# Patient Record
Sex: Male | Born: 1970 | Race: Black or African American | Hispanic: No | Smoking: Current every day smoker
Health system: Southern US, Community
[De-identification: ages and names within clinical notes are randomized; demographics above are authoritative.]

## PROBLEM LIST (undated history)

## (undated) DIAGNOSIS — J189 Pneumonia, unspecified organism: Secondary | ICD-10-CM

## (undated) DIAGNOSIS — F419 Anxiety disorder, unspecified: Secondary | ICD-10-CM

## (undated) DIAGNOSIS — R51 Headache: Secondary | ICD-10-CM

## (undated) DIAGNOSIS — Z9861 Coronary angioplasty status: Principal | ICD-10-CM

## (undated) DIAGNOSIS — N200 Calculus of kidney: Secondary | ICD-10-CM

## (undated) DIAGNOSIS — I10 Essential (primary) hypertension: Secondary | ICD-10-CM

## (undated) DIAGNOSIS — G4733 Obstructive sleep apnea (adult) (pediatric): Secondary | ICD-10-CM

## (undated) DIAGNOSIS — K219 Gastro-esophageal reflux disease without esophagitis: Secondary | ICD-10-CM

## (undated) DIAGNOSIS — I2119 ST elevation (STEMI) myocardial infarction involving other coronary artery of inferior wall: Secondary | ICD-10-CM

## (undated) DIAGNOSIS — I2510 Atherosclerotic heart disease of native coronary artery without angina pectoris: Principal | ICD-10-CM

## (undated) DIAGNOSIS — E785 Hyperlipidemia, unspecified: Secondary | ICD-10-CM

## (undated) DIAGNOSIS — E119 Type 2 diabetes mellitus without complications: Secondary | ICD-10-CM

## (undated) HISTORY — DX: Atherosclerotic heart disease of native coronary artery without angina pectoris: I25.10

## (undated) HISTORY — DX: Coronary angioplasty status: Z98.61

## (undated) HISTORY — DX: ST elevation (STEMI) myocardial infarction involving other coronary artery of inferior wall: I21.19

## (undated) HISTORY — DX: Obstructive sleep apnea (adult) (pediatric): G47.33

## (undated) HISTORY — DX: Hyperlipidemia, unspecified: E78.5

---

## 1990-05-24 HISTORY — PX: SHOULDER ARTHROSCOPY: SHX128

## 2001-04-19 ENCOUNTER — Emergency Department (HOSPITAL_COMMUNITY): Admission: EM | Admit: 2001-04-19 | Discharge: 2001-04-19 | Payer: Self-pay | Admitting: Emergency Medicine

## 2008-07-18 ENCOUNTER — Emergency Department (HOSPITAL_COMMUNITY): Admission: EM | Admit: 2008-07-18 | Discharge: 2008-07-18 | Payer: Self-pay | Admitting: Emergency Medicine

## 2010-01-29 ENCOUNTER — Emergency Department (HOSPITAL_COMMUNITY): Admission: EM | Admit: 2010-01-29 | Discharge: 2010-01-29 | Payer: Self-pay | Admitting: Emergency Medicine

## 2010-08-06 LAB — POCT I-STAT, CHEM 8
Calcium, Ion: 1.15 mmol/L (ref 1.12–1.32)
Chloride: 104 mEq/L (ref 96–112)
Glucose, Bld: 81 mg/dL (ref 70–99)
HCT: 63 % — ABNORMAL HIGH (ref 39.0–52.0)
TCO2: 29 mmol/L (ref 0–100)

## 2010-08-06 LAB — HEMOCCULT GUIAC POC 1CARD (OFFICE): Fecal Occult Bld: POSITIVE

## 2011-05-21 ENCOUNTER — Ambulatory Visit (INDEPENDENT_AMBULATORY_CARE_PROVIDER_SITE_OTHER): Payer: 59

## 2011-05-21 DIAGNOSIS — J45909 Unspecified asthma, uncomplicated: Secondary | ICD-10-CM

## 2011-10-23 ENCOUNTER — Inpatient Hospital Stay (HOSPITAL_COMMUNITY)
Admission: EM | Admit: 2011-10-23 | Discharge: 2011-10-26 | DRG: 247 | Disposition: A | Discharge status: Home / Self Care | Payer: 59 | Attending: Cardiology | Admitting: Cardiology

## 2011-10-23 ENCOUNTER — Encounter (HOSPITAL_COMMUNITY): Payer: Self-pay

## 2011-10-23 ENCOUNTER — Encounter (HOSPITAL_COMMUNITY)
Admission: EM | Disposition: A | Discharge status: Home / Self Care | Payer: Self-pay | Source: Home / Self Care | Attending: Cardiology

## 2011-10-23 DIAGNOSIS — F17200 Nicotine dependence, unspecified, uncomplicated: Secondary | ICD-10-CM | POA: Diagnosis present

## 2011-10-23 DIAGNOSIS — I2111 ST elevation (STEMI) myocardial infarction involving right coronary artery: Secondary | ICD-10-CM | POA: Diagnosis present

## 2011-10-23 DIAGNOSIS — E669 Obesity, unspecified: Secondary | ICD-10-CM | POA: Diagnosis present

## 2011-10-23 DIAGNOSIS — Z955 Presence of coronary angioplasty implant and graft: Secondary | ICD-10-CM

## 2011-10-23 DIAGNOSIS — I2510 Atherosclerotic heart disease of native coronary artery without angina pectoris: Secondary | ICD-10-CM | POA: Diagnosis present

## 2011-10-23 DIAGNOSIS — I2119 ST elevation (STEMI) myocardial infarction involving other coronary artery of inferior wall: Secondary | ICD-10-CM

## 2011-10-23 DIAGNOSIS — Z6835 Body mass index (BMI) 35.0-35.9, adult: Secondary | ICD-10-CM

## 2011-10-23 DIAGNOSIS — I213 ST elevation (STEMI) myocardial infarction of unspecified site: Secondary | ICD-10-CM

## 2011-10-23 DIAGNOSIS — E66812 Obesity, class 2: Secondary | ICD-10-CM

## 2011-10-23 DIAGNOSIS — Z8249 Family history of ischemic heart disease and other diseases of the circulatory system: Secondary | ICD-10-CM

## 2011-10-23 DIAGNOSIS — Z720 Tobacco use: Secondary | ICD-10-CM | POA: Diagnosis present

## 2011-10-23 DIAGNOSIS — I25119 Atherosclerotic heart disease of native coronary artery with unspecified angina pectoris: Secondary | ICD-10-CM | POA: Diagnosis present

## 2011-10-23 HISTORY — PX: LEFT HEART CATHETERIZATION WITH CORONARY ANGIOGRAM: SHX5451

## 2011-10-23 HISTORY — PX: PERCUTANEOUS CORONARY STENT INTERVENTION (PCI-S): SHX5485

## 2011-10-23 HISTORY — DX: ST elevation (STEMI) myocardial infarction involving other coronary artery of inferior wall: I21.19

## 2011-10-23 HISTORY — DX: Atherosclerotic heart disease of native coronary artery without angina pectoris: I25.10

## 2011-10-23 LAB — CBC
Hemoglobin: 16.8 g/dL (ref 13.0–17.0)
MCH: 28.7 pg (ref 26.0–34.0)
MCHC: 33.5 g/dL (ref 30.0–36.0)
RDW: 14.3 % (ref 11.5–15.5)

## 2011-10-23 LAB — BASIC METABOLIC PANEL
BUN: 11 mg/dL (ref 6–23)
Creatinine, Ser: 0.98 mg/dL (ref 0.50–1.35)
GFR calc Af Amer: >90 mL/min (ref >90)
GFR calc non Af Amer: >90 mL/min (ref >90)

## 2011-10-23 LAB — CARDIAC PANEL(CRET KIN+CKTOT+MB+TROPI)
CK, MB: 8.4 ng/mL (ref 0.3–4.0)
Relative Index: 3.5 — ABNORMAL HIGH (ref 0.0–2.5)
Relative Index: 5.1 — ABNORMAL HIGH (ref 0.0–2.5)
Total CK: 242 U/L — ABNORMAL HIGH (ref 7–232)
Total CK: 350 U/L — ABNORMAL HIGH (ref 7–232)

## 2011-10-23 LAB — DIFFERENTIAL
Basophils Absolute: 0 10*3/uL (ref 0.0–0.1)
Basophils Relative: 0 % (ref 0–1)
Eosinophils Absolute: 0.4 10*3/uL (ref 0.0–0.7)
Monocytes Absolute: 0.7 10*3/uL (ref 0.1–1.0)
Monocytes Relative: 7 % (ref 3–12)
Neutro Abs: 6.4 10*3/uL (ref 1.7–7.7)
Neutrophils Relative %: 59 % (ref 43–77)

## 2011-10-23 SURGERY — LEFT HEART CATHETERIZATION WITH CORONARY ANGIOGRAM
Anesthesia: LOCAL

## 2011-10-23 MED ORDER — SODIUM CHLORIDE 0.9 % IJ SOLN
3.0000 mL | Freq: Two times a day (BID) | INTRAMUSCULAR | Status: DC
  Administered 2011-10-24: 21:00:00 via INTRAVENOUS
  Administered 2011-10-25: 3 mL via INTRAVENOUS

## 2011-10-23 MED ORDER — SODIUM CHLORIDE 0.9 % IV SOLN: 0.2500 mg/kg/h | INTRAVENOUS | Status: AC

## 2011-10-23 MED ORDER — MIDAZOLAM HCL 2 MG/2ML IJ SOLN
INTRAMUSCULAR | Status: AC
  Filled 2011-10-23: qty 2

## 2011-10-23 MED ORDER — PRASUGREL HCL 10 MG PO TABS
ORAL_TABLET | ORAL | Status: AC
  Administered 2011-10-24: 10 mg via ORAL
  Filled 2011-10-23: qty 6

## 2011-10-23 MED ORDER — NICOTINE 14 MG/24HR TD PT24
14.0000 mg | MEDICATED_PATCH | Freq: Every day | TRANSDERMAL | Status: DC
  Administered 2011-10-26: 14 mg via TRANSDERMAL
  Filled 2011-10-23 (×4): qty 1

## 2011-10-23 MED ORDER — FENTANYL CITRATE 0.05 MG/ML IJ SOLN
INTRAMUSCULAR | Status: AC
  Filled 2011-10-23: qty 2

## 2011-10-23 MED ORDER — ONDANSETRON HCL 4 MG/2ML IJ SOLN: 4.0000 mg | Freq: Four times a day (QID) | INTRAMUSCULAR | Status: DC | PRN

## 2011-10-23 MED ORDER — DIAZEPAM 5 MG PO TABS: 5.0000 mg | ORAL_TABLET | ORAL | Status: DC | PRN

## 2011-10-23 MED ORDER — SODIUM CHLORIDE 0.9 % IV SOLN: 250.0000 mL | INTRAVENOUS | Status: DC

## 2011-10-23 MED ORDER — HEPARIN BOLUS VIA INFUSION
4000.0000 [IU] | Freq: Once | INTRAVENOUS | Status: AC
  Administered 2011-10-23: 4000 [IU] via INTRAVENOUS

## 2011-10-23 MED ORDER — NITROGLYCERIN IN D5W 200-5 MCG/ML-% IV SOLN
2.0000 ug/min | INTRAVENOUS | Status: DC
  Administered 2011-10-23: 5 ug/min via INTRAVENOUS
  Administered 2011-10-23: 10 ug/min via INTRAVENOUS

## 2011-10-23 MED ORDER — MORPHINE SULFATE 4 MG/ML IJ SOLN
INTRAMUSCULAR | Status: AC
  Administered 2011-10-23: 10:00:00
  Filled 2011-10-23: qty 1

## 2011-10-23 MED ORDER — NITROGLYCERIN 0.2 MG/ML ON CALL CATH LAB
INTRAVENOUS | Status: AC
  Filled 2011-10-23: qty 1

## 2011-10-23 MED ORDER — ACETAMINOPHEN 325 MG PO TABS
650.0000 mg | ORAL_TABLET | ORAL | Status: DC | PRN
  Administered 2011-10-23: 650 mg via ORAL
  Filled 2011-10-23: qty 2

## 2011-10-23 MED ORDER — SODIUM CHLORIDE 0.9 % IJ SOLN: 3.0000 mL | INTRAMUSCULAR | Status: DC | PRN

## 2011-10-23 MED ORDER — LIDOCAINE HCL (PF) 1 % IJ SOLN
INTRAMUSCULAR | Status: AC
  Filled 2011-10-23: qty 30

## 2011-10-23 MED ORDER — NITROGLYCERIN IN D5W 200-5 MCG/ML-% IV SOLN
5.0000 ug/min | INTRAVENOUS | Status: DC
  Administered 2011-10-23: 5 ug/min via INTRAVENOUS

## 2011-10-23 MED ORDER — SODIUM CHLORIDE 0.9 % IV SOLN
INTRAVENOUS | Status: DC
  Administered 2011-10-23: 10:00:00 via INTRAVENOUS

## 2011-10-23 MED ORDER — BIVALIRUDIN 250 MG IV SOLR
INTRAVENOUS | Status: AC
  Filled 2011-10-23: qty 250

## 2011-10-23 MED ORDER — PRASUGREL HCL 10 MG PO TABS
10.0000 mg | ORAL_TABLET | Freq: Every day | ORAL | Status: DC
  Administered 2011-10-24 – 2011-10-26 (×3): 10 mg via ORAL
  Filled 2011-10-23 (×3): qty 1

## 2011-10-23 MED ORDER — ASPIRIN 81 MG PO CHEW
81.0000 mg | CHEWABLE_TABLET | Freq: Every day | ORAL | Status: DC
  Administered 2011-10-24 – 2011-10-26 (×3): 81 mg via ORAL
  Filled 2011-10-23 (×3): qty 1

## 2011-10-23 MED ORDER — HEPARIN (PORCINE) IN NACL 2-0.9 UNIT/ML-% IJ SOLN
INTRAMUSCULAR | Status: AC
  Filled 2011-10-23: qty 2000

## 2011-10-23 MED ORDER — MORPHINE SULFATE 2 MG/ML IJ SOLN
2.0000 mg | INTRAMUSCULAR | Status: DC | PRN
  Filled 2011-10-23: qty 1

## 2011-10-23 MED ORDER — SODIUM CHLORIDE 0.9 % IV SOLN: 1.0000 mL/kg/h | INTRAVENOUS | Status: AC

## 2011-10-23 MED ORDER — ONDANSETRON HCL 4 MG/2ML IJ SOLN
INTRAMUSCULAR | Status: AC
  Filled 2011-10-23: qty 2

## 2011-10-23 MED ORDER — ATORVASTATIN CALCIUM 40 MG PO TABS
40.0000 mg | ORAL_TABLET | Freq: Every day | ORAL | Status: DC
  Administered 2011-10-23 – 2011-10-25 (×3): 40 mg via ORAL
  Filled 2011-10-23 (×4): qty 1

## 2011-10-23 MED ORDER — METOPROLOL SUCCINATE ER 25 MG PO TB24
25.0000 mg | ORAL_TABLET | Freq: Every day | ORAL | Status: DC
  Administered 2011-10-23 – 2011-10-26 (×4): 25 mg via ORAL
  Filled 2011-10-23 (×4): qty 1

## 2011-10-23 NOTE — Progress Notes (Signed)
ANTICOAGULATION CONSULT NOTE - Initial Consult  Pharmacy Consult for bivalirudin (Angiomax)  Indication: chest pain/ACS  No Known Allergies  Patient Measurements: Weight: 299 lb 13.2 oz (136 kg)  Vital Signs: Pulse Rate: 75  (06/01 1130)  Labs:  [facility] 10/23/11 0931  HGB 16.8  HCT 50.1  PLT 188  APTT --  LABPROT --  INR --  HEPARINUNFRC --  CREATININE 0.98  CKTOTAL --  CKMB --  TROPONINI --    CrCl is unknown because there is no height on file for the current visit.   Medical History: Past Medical History  Diagnosis Date  . No pertinent past medical history     Medications:  Scheduled:    . aspirin  81 mg Oral Daily  . atorvastatin  40 mg Oral q1800  . bivalirudin      . fentaNYL      . heparin      . heparin  4,000 Units Intravenous Once  . lidocaine      . metoprolol succinate  25 mg Oral Daily  . midazolam      . midazolam      . morphine      . nicotine  14 mg Transdermal Daily  . nitroGLYCERIN      . ondansetron      . prasugrel      . prasugrel  10 mg Oral Daily  . sodium chloride  3 mL Intravenous Q12H    Assessment: 41 yo M who presented with 10/10 chest pain with STEMI s/p PCI to continue on bivalirudin for 2 hours post-cath. Cath ended ~1100  Goal of Therapy:  Monitor platelets by anticoagulation protocol: Yes   Plan:  1. Bivalirudin 0.25 mg/kg/h x2 hours to end at 1300 2. Monitor for s/sx bleeding    Concha Norway 10/23/2011,12:01 PM

## 2011-10-23 NOTE — Progress Notes (Signed)
This visit was in response to a Code Stemi.  Pt was very tearful and afraid.  Pt asked me to call his mother, George Dickson, and let her know what is going on.  The phone number he gave me was [phone].  I attempted to call it three different times.  Finally I left a message requesting that the pt's mother call [facility] and left the ED phone number.  I alerted ED staff that she would be calling.  Please page me if family or pt needs further assistance.  I will continue to follow today and will refer to unit chaplain for follow-up.   Darrell Hauk  [phone]  oncall pager

## 2011-10-23 NOTE — Progress Notes (Signed)
CRITICAL VALUE ALERT  Critical value received:  ckmb 8.4 trop i 0.84   Date of notification: 10/23/2011   Time of notification:  1:32 PM    Critical value read back:yes  Nurse who received alert:  Desiree Lucy   MD notified (1st page):  hagan b pa  Time of first page:  1:33 PM   MD notified (2nd page):  Time of second page:  Responding MD:  Sharene Skeans, b pa  Time MD responded:  1:33 PM

## 2011-10-23 NOTE — ED Provider Notes (Signed)
History     CSN: [id number]  Arrival date & time 10/23/11  4098   First MD Initiated Contact with Patient 10/23/11 [phone]      Chief Complaint  Patient presents with  . Chest Pain    (Consider location/radiation/quality/duration/timing/severity/associated sxs/prior treatment) Patient is a 41 y.o. male presenting with chest pain. The history is provided by the patient and the EMS personnel. The history is limited by the condition of the patient.  Chest Pain Primary symptoms include nausea. Pertinent negatives for primary symptoms include no shortness of breath and no vomiting.  Pertinent negatives for associated symptoms include no diaphoresis.    the patient is a 41 year old, male, smoker.  He was going to the bathroom and developed left-sided chest pain, which radiated into his left arm.  It was associated with nausea and shortness of breath.  He denied diaphoresis.  Pain is a heaviness.  It is 8/10 in intensity.  He denies a history of hypertension, diabetes, prior coronary artery disease.  He, says that his mother had a heart attack, as well.  He takes no medications and denies allergies to medications.  He called EMS.  They picked him up and gave him nitroglycerin morphine and aspirin.  Level V caveat applies for code STEMI  History reviewed. No pertinent past medical history.  History reviewed. No pertinent past surgical history.  History reviewed. No pertinent family history.  History  Substance Use Topics  . Smoking status: Current Everyday Smoker  . Smokeless tobacco: Not on file  . Alcohol Use: No      Review of Systems  Constitutional: Negative for diaphoresis.  Respiratory: Negative for shortness of breath.   Cardiovascular: Positive for chest pain.  Gastrointestinal: Positive for nausea. Negative for vomiting.    Allergies  Review of patient's allergies indicates no known allergies.  Home Medications  No current outpatient prescriptions on file.  There were  no vitals taken for this visit.  Physical Exam  Nursing note and vitals reviewed. Constitutional: He is oriented to person, place, and time. He appears well-developed and well-nourished. No distress.       Anxious  HENT:  Head: Normocephalic and atraumatic.  Eyes: Conjunctivae are normal.  Neck: Normal range of motion. Neck supple.  Cardiovascular: Normal rate.   No murmur heard.      2+ radial, and dorsalis pedis pulses bilaterally  Pulmonary/Chest: Effort normal. He has no rales.  Abdominal: Soft. He exhibits no distension. There is no tenderness.  Musculoskeletal: Normal range of motion. He exhibits no edema.  Neurological: He is alert and oriented to person, place, and time.  Skin: Skin is warm and dry.  Psychiatric: He has a normal mood and affect. Thought content normal.    ED Course  Procedures (including critical care time) Inferior wall MI Heparin started in the emergency department at STEMI called the cardiologist arrived.  A few minutes after I entered the patient to the Cath Lab.  Labs Reviewed  CBC  DIFFERENTIAL  BASIC METABOLIC PANEL   No results found.   No diagnosis found.  ecg Normal sinus rhythm with heart rate 70 Normal axis Incomplete right bundle branch block ST segment elevation in the inferior lead with reciprocal ST depression in leads 1 and aVL Impression inferior wall MI  ecg right-sided Normal sinus rhythm with heart rate 70 Normal axis ST segment elevation, and inferiorly.  This as well as V3 through V6 Inferior wall MI, and right ventricular MI  MDM   STEMI  involving the inferior wall, and right        Cheri Guppy, MD 10/23/11 [phone]

## 2011-10-23 NOTE — ED Notes (Signed)
Pt reports chest pain beginning 2 hours ago, rads to the left arm, nausea, SOB diaphoresis, stemi called enroute

## 2011-10-23 NOTE — Progress Notes (Signed)
Pt has been informed that his mother is en route to the hospital.

## 2011-10-23 NOTE — CV Procedure (Signed)
[facility]     CARDIAC CATHETERIZATION REPORT  NAME:  George Dickson     MRN: [id number] DATE OF BIRTH:  1970/12/26   ADMIT DATE:  10/23/2011  Performing Cardiologist: Marykay Lex, M.D., M.S. Primary Physician: No primary provider on file. Primary Cardiologist:  None - pre-MI  Procedures Performed:  Left Heart Catheterization via 6 Fr Right Common Femoral Artery access  Left Ventriculography, (RAO) 12 ml/sec for 25 ml total contrast  Native Coronary Angiography  Aspiration Thrombectomy of 100% thrombotic occlusion of Proximal RCA -- with significant thrombus extraction  IC NTG Injection -  Percutaneous Coronary Artery Intervention on proximal RCA with a Promus Element DES 3.0 mm x 24 mm; final diameter 3.2 mm distal, 3.35 mm proximal   Angio-Seal closure of the Right Common Femoral Artery  Indication(s): Inferior / RV STEMI  Chronic Smoker  History: 41 y.o. male with no prior medical history besides being a chronic smoker, his mother had coronary stents placed in her 43s who presented via EMS after having acute onset of chest pain radiating to left arm associated nausea shortness breath and diaphoresis. Pain began at 0730 hours.  Upon arrival by EMS an ECG was obtained at roughly 9:00 in the morning revealing 2-3 mm Inferior ST Elevations with Lateral ST depressions as reciprocal changes. Consent was initiated the patient was brought her to [facility] emergency room where he was stabilized prior to be brought emergently to cardiac catheterization lab. He received a total of 8 mg of IV morphine, 324 mg of aspirin and 4000 Units of IV Heparin.   He arrived at the Cath Lab at 0936 hours.  Consent: The procedure with Risks/Benefits/Alternatives and Indications was reviewed with the patient.  All questions were answered.    Risks / Complications include, but not limited to: Death, MI, CVA/TIA, VF/VT (with defibrillation), Bradycardia (need for  temporary pacer placement), contrast induced nephropathy, bleeding / bruising / hematoma / pseudoaneurysm, vascular or coronary injury (with possible emergent CT or Vascular Surgery), adverse medication reactions, infection.    The patient voiced understanding and agree to proceed.    Unable to obtain consent because of emergent medical necessity. Verbal consent was obtained as witnessed by ER staff and Cath Lab staff.  Procedure: The patient was brought to the 2nd Floor [place] [facility] in the fasting state and prepped and draped in the usual sterile fashion for Right groin access. Sterile technique was used including antiseptics, cap, gloves, gown, hand hygiene, mask and sheet.  Skin prep: Chlorhexidine;  Time Out: Verified patient identification, verified procedure, site/side was marked, verified correct patient position, special equipment/implants available, medications/allergies/relevent history reviewed, required imaging and test results available.  Performed  The right femoral head was identified using tactile and fluoroscopic technique.  The right groin was anesthetized with 1% subcutaneous Lidocaine.  The right Common Femoral Artery was accessed using the Modified Seldinger Technique with placement of a antimicrobial bonded/coated single lumen ( 6 Fr) sheath.  The sheath was aspirated and flushed.     A 5 Fr  JL4 Catheter was advanced of over a Standard J wire into the ascending Aorta and used to engage the Left Coronary Artery.  Multiple cineangiographic views of the  Left Coronary Artery system were performed.    Next, a 6  Fr  JR 4 Guide Catheter was advanced of over a standard J  wire into the ascending Aorta and used to engage the  Right Coronary  Artery.   A Guiding cineangiographic view of the Right Coronary Artery system were performed.  PCI procedure was then performed as described below   Following the PCI, the Guide Catheter was then exchanged over the J  wire for an angled Pigtail catheter that was advanced across the Aortic Valve.  LV hemodynamics were measured and Left Ventriculography was performed.  LV hemodynamics were then re-sampled, and the catheter was pulled back across the Aortic Valve for measurement of "pull-back" gradient.  The catheter was removed completely out of the body.  With the wire still in place and the sheath, a selective Right Common Femoral Angiogram was performed demonstrating excellent anatomy for Angio-Seal closure.  The groin was then re\re prepped, and sterile towels reapplied, new sterile gloves donned the  The right common femoral artery was then successfully sealed with a 6 French Angio-Seal closure device with excellent hemostasis.  The patient was transferred to the CCU in a chest pain-[place], hemodynamically stable condition the  Hemodynamics:  Central Aortic Pressure / Mean Aortic Pressure:  134/84 mmHg; 102 mmHg   LV Pressure / LV End diastolic Pressure:   133/4 mmHg; 13 mmHg  Left Ventriculography:  EF:   50-55%  Wall Motion:  mild inferoapical and inferobasal hypokinesis   Coronary Angiographic Data: Right dominant but with large Circumflex   Left Main:   Very Large caliber vessel bifurcates into a LAD and circumflex. Angiographically normal  Left Anterior Descending (LAD):   Large caliber somewhat tortuous vessel which reaches down around the apex. There are several small septal perforators with one large trunk in the mid vessel just following a proximal D1; angiographically normal  Major1st diagonal (D1):   Moderate caliber vessel comes off very proximally covering a large portion of the lateral wall; angiographic normal with several small branches  Circumflex (LCx):   Very large caliber vessel with a small insignificant proximal OM followed by a atrial branch. Just prior to coursing into the AV groove it gives off OM1 then terminates as a large branching OM 2/OM 3 and 4 after giving off a moderate  caliber AV groove branch that supplies 2 posterior lateral branch; angiographic normal  1st obtuse marginal:   Moderate large caliber vessel gives off a very proximal branch and then courses down along toward the apex but getting distally; angiographic normal  2nd obtuse marginal:   Large caliber vessel that gives off the AV groove branch as well as a moderate OM  which itself bifurcates into OM 3 or 4 (LPL 1 and 2), the OM 2 itself is in continued on as a large caliber vessel coursing along the posterior lateral wall down to the apex; angiographic normal  3rd &4th obtuse marginals:   These 2 vessels bifurcate shortly after being taken of the OM 2, the cover a almost posterior lateral distribution; angiographically normal   posterior lateral branch:   There are 2 smaller LPL 3 or fours the time of the AV Groove Circumflex itself is a moderate caliber vessel; angiographic normal  Right Coronary Artery:  large caliber vessel that is 100% thrombotic occluded proximally just after the SA nodal artery. Post PCI images:  The RCA itself to large caliber vessel giving rise to a small right posterior lateral system with the AV nodal artery and a moderate-sized posterior descending artery.  Following the occlusion vessel demonstrated a long tubular 90% stenosis leading up to an RV marginal branch the following that the remainder the vessel is relatively angiographic Ishmael of  disease with the exception of roughly 30-40% tubular stenosis distally prior to giving off the right posterior descending artery  Right Posterior Descending artery: Moderate caliber vessel, diffuse minimal luminal irregularities. The vessel reaches almost to the inferoapex.  Posterior Lateral Branch:  Several small branches, minimal luminal irregularities  PERCUTANEOUS CORONARY INTERVENTION PROCEDURE  After reviewing the initial cineangiography images, the culprit lesion(s) was identified, and the decision was made to proceed with  percutaneous coronary intervention.  A weight based bolus of IV Angiomax was administered and the drip was continued until completion of the procedure with the plan to continue for 2 hours.   Oral Prasugrel 60 mg was administered.  The Guide catheter(s) were advanced over a J-wire and used to engage the right Coronary Artery. An ACT of > 200 Sec was confirmed prior to advancing the Guidewire.  Lesion #1:  Proximal RCA  Pre-PCI Stenosis: 100% % Post-PCI Stenosis: 0 %     TIMI 0 flow       TIMI 3 flow  Guide Catheter: 6 [place] JR 4 Guidewire: BMW -- reperfusion with wire  Pronto LP 6 French aspiration thrombectomy catheter -- 2 passes; .  Post thrombectomy there revealed a long tubular initially 90 but after nitroglycerin 70% stenosis.  Pre-Dilitation Balloon: Emerge Monorail 2.5 mm x 20 mm  1st Inflation: 8 Atm for 30 Sec  Scout angiography did not reveal evidence of dissection or perforation.  Brisk TIMI-3 flow was restored.  Stent: Promus Element DES 3.0 mm x 24 mm  Deployment: 12 Atm for 30 Sec  2nd Inflation: 14 Atm for 45 Sec; final diameter 3.2 mm Scout angiography did not reveal evidence of dissection or perforation; distal stent appeared to be adequately deployed, but the proximal portion appeared to be under deployed, warranting post-dilation.  Post-Dilitation Balloon: [place] Quantum Apex 3.25 mm 15 mm  1st Inflation: 15 Atm for 45 Sec; mid to distal stent; final diameter 3.3 million  2nd Inflation: 17 Atm for 40 Sec; proximal stent - final diameter 3.35 mm  Post-deployment cineangiography with and without guidewire in place was performed in multiple orthogonal views demonstrating excellent stent deployment and did not reveal evidence of dissection or perforation  The patient was transported to the CCU in a hemodynamically stable & chest pain Styles condition.   The patient  was stable before, during and following the procedure.   Patient did tolerate procedure well. There  were not complications. EBL: <10 ml  Medications:  Premedication: 4000 Units IV Heparin; 8 mg  Morphine, 324 mg  ASA   Sedation:  3 mg IV Versed, 75 mcg IV Fentanyl  Contrast:  185 ml Omnipaque  Anticoagulation: Angiomax Bolus (1.75mg /kg) and drip (0.75mg /kg/hr) - reduced to 0.25mg /kg/hr for 2hr post PCI  Prasugrel 60mg   Zofran 4mg  IV  IC Ntg  Impression:  Culprit lesion for Inferior RV Infarction was a proximally thrombotic 100% occlusion of the Proximal RCA   Successful reduction of the 100% lesion to 0% via Aspiration Thrombectomy, PTCA and PCI with a Promus Element 3.0 mm x 24 mm DES post dilated to 3.2 mm distally & 3.35 mm proximally.  A large thrombus was noted during examination of aspirate.  Large caliber tortuous LCA vessels with no significant disease  Preserved LV Function (EF ~50-55%) with distal inferoapical hypokinesis  Normal LVEDP (13 mmHg - low for RV Infarct)   Plan:  Admit to CCU with plan for likely Fast-Track provided he remains stable  On ASA & Prasugrel for DES ->  CM consulted for medication assistance.  Initiate Statin & BB therapy; ACE-I if able  Check labs for CAD Risk Factors - Hgb A1c, TSH, Lipid panel  Smoking cessation counseling ordered, Nicotine patch ordered.  The case and results was discussed with the patient (and family).   Time Spend Directly with Patient:  70 minutes  Sid Greener W, M.D., M.S. [facility] [address] [place], [place]  [address]  [phone]

## 2011-10-23 NOTE — H&P (Signed)
George Dickson Flight is an 41 y.o. male.   Chief Complaint:  CP HPI:   The patient is a 41 yo AA male with a history of tobacco use but no other significant PMH  and no family history of CAD/MI that George Dickson is aware of.  George Dickson presents with CP which began approximately 0730hrs with radiation to the left arm, nausea, SOB and diaphoresis.  STEMI was called by EMS on route to [facility].  History reviewed. No pertinent past medical history.  History reviewed. No pertinent past surgical history.  History reviewed. No pertinent family history. Social History:  reports that George Dickson has been smoking.  George Dickson does not have any smokeless tobacco history on file. George Dickson reports that George Dickson does not drink alcohol or use illicit drugs.  Allergies: No Known Allergies  No prescriptions prior to admission    Results for orders placed during the hospital encounter of 10/23/11 (from the past 48 hour(s))  CBC     Status: Abnormal   Collection Time   10/23/11  9:31 AM      Component Value Range Comment   WBC 10.8 (*) 4.0 - 10.5 (K/uL)    RBC 5.86 (*) 4.22 - 5.81 (MIL/uL)    Hemoglobin 16.8  13.0 - 17.0 (g/dL)    HCT 16.1  09.6 - 04.5 (%)    MCV 85.5  78.0 - 100.0 (fL)    MCH 28.7  26.0 - 34.0 (pg)    MCHC 33.5  30.0 - 36.0 (g/dL)    RDW 40.9  81.1 - 91.4 (%)    Platelets 188  150 - 400 (K/uL)   DIFFERENTIAL     Status: Normal   Collection Time   10/23/11  9:31 AM      Component Value Range Comment   Neutrophils Relative 59  43 - 77 (%)    Neutro Abs 6.4  1.7 - 7.7 (K/uL)    Lymphocytes Relative 30  12 - 46 (%)    Lymphs Abs 3.3  0.7 - 4.0 (K/uL)    Monocytes Relative 7  3 - 12 (%)    Monocytes Absolute 0.7  0.1 - 1.0 (K/uL)    Eosinophils Relative 4  0 - 5 (%)    Eosinophils Absolute 0.4  0.0 - 0.7 (K/uL)    Basophils Relative 0  0 - 1 (%)    Basophils Absolute 0.0  0.0 - 0.1 (K/uL)    No results found.  Review of Systems  Constitutional: Positive for diaphoresis.  Cardiovascular: Positive for chest pain.  Gastrointestinal:  Positive for nausea.  Musculoskeletal:       Radiation of pain to left arm.    There were no vitals taken for this visit. Physical Exam  Constitutional: George Dickson appears well-developed. George Dickson appears distressed.       Obese  HENT:  Head: Normocephalic and atraumatic.  Eyes: Pupils are equal, round, and reactive to light.  Cardiovascular: Normal rate and regular rhythm.   No murmur heard. Pulses:      Radial pulses are 2+ on the right side, and 2+ on the left side.       Dorsalis pedis pulses are 2+ on the right side, and 2+ on the left side.       No LEE No Carotid bruits   GI: Bowel sounds are normal.  Neurological: George Dickson is alert.     Assessment/Plan Patient Active Hospital Problem List: Tobacco use (10/23/2011) STEMI (ST elevation myocardial infarction) (10/23/2011) Obesity, Class II, BMI 35-39.9 (  10/23/2011)  Plan:  EKG showed inferior ST elevation.  The patient was taken urgently to the cath lab.  HAGER, BRYAN 10/23/2011, 10:04 AM  I met George Dickson in the ER - 2-3 mm Inferior STEMI on ECG; R sided ECG with 2mm STE in R4 & R5 c/w RV infarction.  Thankfully his BP was stable & HR stable after 8mg  of Morphine & NTG gtt.  George Dickson was taken emergently to the cath lab upon team arrival.   CONSETNT Performing MD:  Marykay Lex, M.D., M.S.  Procedure:  Left Heart Catheterization with Coronary Angiography & Percutaneous Coronary Intervention.  The procedure with Risks/Benefits/Alternatives and Indications was reviewed with the patient .  All questions were answered.    Risks / Complications include, but not limited to: Death, MI, CVA/TIA, VF/VT (with defibrillation), Bradycardia (need for temporary pacer placement), contrast induced nephropathy, bleeding / bruising / hematoma / pseudoaneurysm, vascular or coronary injury (with possible emergent CT or Vascular Surgery), adverse medication reactions, infection.    The patient voiced understanding and agree to proceed.   Emergency Verbal consent  obtained.  Marykay Lex, M.D., M.S. [facility] [address] [place], [place]  [address]  [phone]  10/23/2011 11:07 AM

## 2011-10-23 NOTE — Progress Notes (Signed)
This was a follow-up visit to ensure pt's family had arrived safely.  Pt was resting quietly with mother and daughter at bedside.  I introduced myself to family and offered emotional support to pt and family.  Please page me if further assistance is needed.  Chihiro Frey  [phone] oncall pager

## 2011-10-24 DIAGNOSIS — Z955 Presence of coronary angioplasty implant and graft: Secondary | ICD-10-CM

## 2011-10-24 LAB — CARDIAC PANEL(CRET KIN+CKTOT+MB+TROPI)
Relative Index: 5.2 — ABNORMAL HIGH (ref 0.0–2.5)
Troponin I: 2.74 ng/mL (ref <0.30)

## 2011-10-24 LAB — HEMOGLOBIN A1C
Hgb A1c MFr Bld: 6 % — ABNORMAL HIGH
Mean Plasma Glucose: 126 mg/dL — ABNORMAL HIGH

## 2011-10-24 LAB — TSH: TSH: 1.335 u[IU]/mL (ref 0.350–4.500)

## 2011-10-24 LAB — HEPATIC FUNCTION PANEL
ALT: 37 U/L (ref 0–53)
AST: 39 U/L — ABNORMAL HIGH (ref 0–37)
Albumin: 3.5 g/dL (ref 3.5–5.2)
Alkaline Phosphatase: 75 U/L (ref 39–117)
Bilirubin, Direct: 0.1 mg/dL (ref 0.0–0.3)
Indirect Bilirubin: 0.4 mg/dL (ref 0.3–0.9)
Total Bilirubin: 0.5 mg/dL (ref 0.3–1.2)
Total Protein: 6.7 g/dL (ref 6.0–8.3)

## 2011-10-24 LAB — BASIC METABOLIC PANEL
CO2: 25 mEq/L (ref 19–32)
Chloride: 104 mEq/L (ref 96–112)
Glucose, Bld: 111 mg/dL — ABNORMAL HIGH (ref 70–99)
Potassium: 3.8 mEq/L (ref 3.5–5.1)
Sodium: 138 mEq/L (ref 135–145)

## 2011-10-24 LAB — CBC
Hemoglobin: 15.9 g/dL (ref 13.0–17.0)
RBC: 5.54 MIL/uL (ref 4.22–5.81)
WBC: 10.6 10*3/uL — ABNORMAL HIGH (ref 4.0–10.5)

## 2011-10-24 LAB — LIPID PANEL
HDL: 30 mg/dL — ABNORMAL LOW (ref >39)
LDL Cholesterol: 95 mg/dL (ref 0–99)
Total CHOL/HDL Ratio: 5.1 RATIO
Triglycerides: 134 mg/dL (ref <150)

## 2011-10-24 MED ORDER — TRAMADOL HCL 50 MG PO TABS: 50.0000 mg | ORAL_TABLET | Freq: Four times a day (QID) | ORAL | Status: DC | PRN

## 2011-10-24 MED ORDER — MORPHINE SULFATE 2 MG/ML IJ SOLN: 2.0000 mg | INTRAMUSCULAR | Status: DC | PRN

## 2011-10-24 MED ORDER — ZOLPIDEM TARTRATE 5 MG PO TABS: 10.0000 mg | ORAL_TABLET | Freq: Every evening | ORAL | Status: DC | PRN

## 2011-10-24 MED ORDER — LISINOPRIL 5 MG PO TABS
5.0000 mg | ORAL_TABLET | Freq: Every day | ORAL | Status: DC
  Administered 2011-10-24 – 2011-10-26 (×3): 5 mg via ORAL
  Filled 2011-10-24 (×3): qty 1

## 2011-10-24 MED FILL — Dextrose Inj 5%: INTRAVENOUS | Qty: 50 | Status: AC

## 2011-10-24 NOTE — Progress Notes (Signed)
[facility]  Subjective: Patient complains of groin pain and asked for more than tylenol; Otherwise he is sitting comfortably in bed with a smile on his face.  Objective: Vital signs in last 24 hours: Temp:  [97.9 F (36.6 C)-98.7 F (37.1 C)] 98.3 F (36.8 C) (06/02 0800) Pulse Rate:  [51-86] 81  (06/02 0800) Resp:  [15-25] 18  (06/02 0800) BP: (125-153)/(70-91) 149/79 mmHg (06/02 0800) SpO2:  [100 %] 100 % (06/02 0800) Weight:  [136 kg (299 lb 13.2 oz)] 136 kg (299 lb 13.2 oz) (06/01 1200)    Intake/Output from previous day: 06/01 0701 - 06/02 0700 In: 1067.2 [P.O.:720; I.V.:347.2] Out: 1650 [Urine:1650] Intake/Output this shift: Total I/O In: 360 [P.O.:360] Out: 650 [Urine:650]  Medications Current Facility-Administered Medications  Medication Dose Route Frequency Provider Last Rate Last Dose  . 0.9 %  sodium chloride infusion  1 mL/kg/hr Intravenous Continuous Marykay Lex, MD 136 mL/hr at 10/23/11 1130 1 mL/kg/hr at 10/23/11 1130  . 0.9 %  sodium chloride infusion  250 mL Intravenous Continuous Marykay Lex, MD 1 mL/hr at 10/23/11 1930 250 mL at 10/23/11 1930  . acetaminophen (TYLENOL) tablet 650 mg  650 mg Oral Q4H PRN Marykay Lex, MD   650 mg at 10/23/11 1635  . aspirin chewable tablet 81 mg  81 mg Oral Daily Marykay Lex, MD      . atorvastatin (LIPITOR) tablet 40 mg  40 mg Oral q1800 Marykay Lex, MD   40 mg at 10/23/11 1826  . bivalirudin (ANGIOMAX) 250 MG injection           . bivalirudin (ANGIOMAX) 5 mg/mL in sodium chloride 0.9 % 50 mL infusion  0.25 mg/kg/hr Intravenous Continuous Marykay Lex, MD      . diazepam (VALIUM) tablet 5 mg  5 mg Oral Q4H PRN Marykay Lex, MD      . fentaNYL (SUBLIMAZE) 0.05 MG/ML injection           . heparin 2-0.9 UNIT/ML-% infusion           . lidocaine (XYLOCAINE) 1 % injection           . lisinopril (PRINIVIL,ZESTRIL) tablet 5 mg  5 mg Oral Daily Marykay Lex, MD      .  metoprolol succinate (TOPROL-XL) 24 hr tablet 25 mg  25 mg Oral Daily Marykay Lex, MD   25 mg at 10/23/11 1344  . midazolam (VERSED) 2 MG/2ML injection           . midazolam (VERSED) 2 MG/2ML injection           . morphine 2 MG/ML injection 2 mg  2 mg Intravenous Q2H PRN Marykay Lex, MD      . morphine 4 MG/ML injection           . nicotine (NICODERM CQ - dosed in mg/24 hours) patch 14 mg  14 mg Transdermal Daily Marykay Lex, MD      . nitroGLYCERIN (NTG ON-CALL) 0.2 mg/mL injection           . nitroGLYCERIN 0.2 mg/mL in dextrose 5 % infusion  2-200 mcg/min Intravenous Continuous Marykay Lex, MD 1.5 mL/hr at 10/23/11 1457 5 mcg/min at 10/23/11 1457  . ondansetron (ZOFRAN) 4 MG/2ML injection           . ondansetron (ZOFRAN) injection 4 mg  4 mg Intravenous Q6H PRN Marykay Lex, MD      .  prasugrel (EFFIENT) 10 MG tablet           . prasugrel (EFFIENT) tablet 10 mg  10 mg Oral Daily Marykay Lex, MD      . sodium chloride 0.9 % injection 3 mL  3 mL Intravenous Q12H Marykay Lex, MD      . sodium chloride 0.9 % injection 3 mL  3 mL Intravenous PRN Marykay Lex, MD      . DISCONTD: 0.9 %  sodium chloride infusion   Intravenous Continuous Cheri Guppy, MD 125 mL/hr at 10/23/11 0934    . DISCONTD: nitroGLYCERIN 0.2 mg/mL in dextrose 5 % infusion  5 mcg/min Intravenous Titrated Cheri Guppy, MD   5 mcg/min at 10/23/11 [phone]    PE: General appearance: alert, cooperative and no distress Lungs: clear to auscultation bilaterally Heart: regular rate and rhythm, S1, S2 normal, no murmur, click, rub or gallop Abdomen: BS Nontender Extremities: No LEE Pulses: 2+ and symmetric Right groi:  Tender, no hematoma, bruit or ecchymosis  Lab Results:   Basename 10/24/11 0625 10/23/11 0931  WBC 10.6* 10.8*  HGB 15.9 16.8  HCT 47.7 50.1  PLT 179 188   BMET  Basename 10/24/11 0625 10/23/11 0931  NA 138 139  K 3.8 3.9  CL 104 103  CO2 25 23  GLUCOSE 111* 135*  BUN  8 11  CREATININE 0.90 0.98  CALCIUM 9.0 9.2   PT/INR No results found for this basename: LABPROT:3,INR:3 in the last 72 hours Cholesterol  Basename 10/24/11 0625  CHOL 152   Cardiac Enzymes CK, MB 8.4 18.0 15.6  Total CK 242 350 302  Troponin I 0.84 5.58 2.74(Last)   Studies/Results: No recent study   Assessment/Plan Principal Problem:  *STEMI (ST elevation myocardial infarction) Active Problems:  Presence of 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  No further CP.  Troponin peaked at 5.58.  2D Echo pending.  Will transfer patient to telemetry.  ACE added this AM.   LOS: 1 day   HAGER, BRYAN 10/24/2011 8:51 AM  ATTENDING ATTESTATION:  I have seen and examined the patient along with Wilburt Finlay (PA, NP).  I have reviewed the chart, notes and new data.  I agree with Bryan's note.  Brief Description: Very pleasant 41 year old Afro-American gentleman without any significant past exercise smoking and obesity who's mother had stents in the 50-60s -- presented with Inferolateral/Posterior STEMI --> taken emergently to the cardiac catheterization lab -->  100% proximal RCA occlusion with wire reperfusion, status post Aspiration Thrombectomy, PTCA/PCI a proximal RCA with a Promus Element DES 3.0 mm 24 mm -  Post dilated to 3.2 mm distally and 3.35 mm proximally (at the SA nodal artery). Minimal troponin elevation with peak of 5.58.  ECG shows evolution of T-wave inversions in lead 3 and aVF, J-point elevation anteriorly.  His major complaint is groin soreness at the site of his Angio-Seal.  Otherwise looks great.   Hemodynamically stable, with blood pressures now in the 140s. Heart rate controlled on low-dose beta blocker.  PLAN:  Hold her current dose of beta blocker and add low-dose ACE inhibitor.  Transfer to telemetry today, and  initiate cardiac rehabilitation therapy.  Continue statin - current lipid panel actually looks relatively good, but anticipate that it will be  worse when not in the acute setting the  Continued anticipate fast track discharge: Per protocol 72 hours would be Tuesday morning.  Care manager consult in place for Effient  Cardiac rehabilitation consultation place  Smoking cessation counseling.  Marykay Lex, M.D., M.S. [facility] [address] [place], [place]  [address]  [phone]  10/24/2011 9:25 AM

## 2011-10-25 HISTORY — PX: TRANSTHORACIC ECHOCARDIOGRAM: SHX275

## 2011-10-25 NOTE — Progress Notes (Signed)
CARDIAC REHAB PHASE I   PRE:  Rate/Rhythm: 70 SR  BP:  Supine: 120/70  Sitting:   Standing:    SaO2: 93 RA  MODE:  Ambulation: 890 ft   POST:  Rate/Rhythem:   BP:  Supine:   Sitting: 110/80  Standing:    SaO2: 1015-1200 Tolerated ambulation well without c/o of cp or SOB. VS stable. Completed discharge education with pt. He agrees to [employer]. CRP in [place], will send referral.  Beatrix Fetters

## 2011-10-25 NOTE — Consult Note (Signed)
Pt smokes 3 Black & Mild cigars per day. He used to smoke 1/3 ppd of regular cigarettes. Pt is in action stage and willing to give up the 3 cigars. Reinforced the reason for completely quitting with the pt. Pt voices understanding. His plan is to quit completely on his own. Referred to 1-800 quit now for f/u and support. Discussed oral fixation substitutes, second hand smoke and in home smoking policy. Reviewed and gave pt Written education/contact information.

## 2011-10-25 NOTE — Progress Notes (Signed)
  Echocardiogram 2D Echocardiogram has been performed.  George Dickson 10/25/2011, 9:34 AM

## 2011-10-25 NOTE — Progress Notes (Signed)
[facility]  Subjective: Doing well.  No complaints.  Objective: Vital signs in last 24 hours: Temp:  [97.3 F (36.3 C)-98.3 F (36.8 C)] 97.4 F (36.3 C) (06/03 0503) Pulse Rate:  [54-72] 68  (06/03 0503) Resp:  [16-20] 20  (06/03 0503) BP: (121-144)/(75-90) 121/75 mmHg (06/03 0503) SpO2:  [97 %-100 %] 97 % (06/03 0503) Last BM Date: 10/24/11  Intake/Output from previous day: 06/02 0701 - 06/03 0700 In: 840 [P.O.:840] Out: 1450 [Urine:1450] Intake/Output this shift:    Medications Current Facility-Administered Medications  Medication Dose Route Frequency Provider Last Rate Last Dose  . 0.9 %  sodium chloride infusion  250 mL Intravenous Continuous Marykay Lex, MD 1 mL/hr at 10/23/11 1930 250 mL at 10/23/11 1930  . acetaminophen (TYLENOL) tablet 650 mg  650 mg Oral Q4H PRN Marykay Lex, MD   650 mg at 10/23/11 1635  . aspirin chewable tablet 81 mg  81 mg Oral Daily Marykay Lex, MD   81 mg at 10/24/11 0948  . atorvastatin (LIPITOR) tablet 40 mg  40 mg Oral q1800 Marykay Lex, MD   40 mg at 10/24/11 1801  . diazepam (VALIUM) tablet 5 mg  5 mg Oral Q4H PRN Marykay Lex, MD      . lisinopril (PRINIVIL,ZESTRIL) tablet 5 mg  5 mg Oral Daily Marykay Lex, MD   5 mg at 10/24/11 0947  . metoprolol succinate (TOPROL-XL) 24 hr tablet 25 mg  25 mg Oral Daily Marykay Lex, MD   25 mg at 10/24/11 0948  . morphine 2 MG/ML injection 2 mg  2 mg Intravenous Q2H PRN Marykay Lex, MD      . nicotine (NICODERM CQ - dosed in mg/24 hours) patch 14 mg  14 mg Transdermal Daily Marykay Lex, MD      . nitroGLYCERIN 0.2 mg/mL in dextrose 5 % infusion  2-200 mcg/min Intravenous Continuous Marykay Lex, MD 1.5 mL/hr at 10/23/11 1457 5 mcg/min at 10/23/11 1457  . ondansetron (ZOFRAN) injection 4 mg  4 mg Intravenous Q6H PRN Marykay Lex, MD      . prasugrel (EFFIENT) tablet 10 mg  10 mg Oral Daily Marykay Lex, MD   10 mg at 10/24/11 0947    . sodium chloride 0.9 % injection 3 mL  3 mL Intravenous Q12H Marykay Lex, MD      . sodium chloride 0.9 % injection 3 mL  3 mL Intravenous PRN Marykay Lex, MD      . traMADol Janean Sark) tablet 50 mg  50 mg Oral Q6H PRN Abelino Derrick, PA      . zolpidem (AMBIEN) tablet 10 mg  10 mg Oral QHS PRN Abelino Derrick, PA      . DISCONTD: morphine 2 MG/ML injection 2 mg  2 mg Intravenous Q4H PRN Wilburt Finlay, PA        PE: General appearance: alert, cooperative and no distress Lungs: clear to auscultation bilaterally Heart: regular rate and rhythm, S1, S2 normal, no murmur, click, rub or gallop Extremities: No LEE Pulses: 2+ and symmetric  Lab Results:   Basename 10/24/11 0625 10/23/11 0931  WBC 10.6* 10.8*  HGB 15.9 16.8  HCT 47.7 50.1  PLT 179 188   BMET  Basename 10/24/11 0625 10/23/11 0931  NA 138 139  K 3.8 3.9  CL 104 103  CO2 25 23  GLUCOSE 111* 135*  BUN 8 11  CREATININE 0.90 0.98  CALCIUM 9.0 9.2   PT/INR No results found for this basename: LABPROT:3,INR:3 in the last 72 hours Cholesterol  Basename 10/24/11 0625  CHOL 152   Cardiac Enzymes No components found with this basename: TROPONIN:3, CKMB:3  Studies/Results: 2D Echo complete and pending read.   Assessment/Plan  Principal Problem:  *STEMI (ST elevation myocardial infarction) Active Problems:  Tobacco use  Obesity, Class II, BMI 35-39.9  Presence of drug coated stent in right coronary artery - Promus 3.0 mm x 24 mm - post dilated to 3.3 mm   Plan: S/P DES to RCA.   Echo read pending.  BP Controlled..  Extensive dietary and exercise discussion.  Likely DC tomorrow.  Cardiac Rehab.   LOS: 2 days    HAGER, BRYAN 10/25/2011 9:51 AM  ATTENDING ATTESTATION:  I have seen and examined the patient along with Wilburt Finlay (PA, NP). I have reviewed the chart, notes and new data. I agree with Bryan's note.   Brief Description: Very pleasant 41 year old Afro-American gentleman without any significant  past exercise smoking and obesity who's mother had stents in the 50-60s -- presented with Inferolateral/Posterior STEMI --> taken emergently to the cardiac catheterization lab --> 100% proximal RCA occlusion with wire reperfusion, status post Aspiration Thrombectomy, PTCA/PCI a proximal RCA with a Promus Element DES 3.0 mm 24 mm - Post dilated to 3.2 mm distally and 3.35 mm proximally (at the SA nodal artery).  Minimal troponin elevation with peak of 5.58. ECG shows evolution of T-wave inversions in lead 3 and aVF, J-point elevation anteriorly.  He continues to progress well - transferred to [facility] yesterday. Looks & Feels great.  Groin no longer tender.  Ambulating without trouble.  Hemodynamically stable, with BP -120s  & HR - 60s on low-dose beta blocker & ACE-I. PLAN:  Hold current dose of beta blocker and ACE inhibitor.  Continue statin - current lipid panel actually looks relatively good, but anticipate that it will be worse when not in the acute setting the  Care manager consult in place for Effient  Cardiac rehabilitation consultation place  Smoking cessation counseling.  Continued anticipate fast track discharge: Per protocol 72 hours would be Tuesday morning.   Marykay Lex, M.D., M.S. [facility] [address] [place], [place]  [address]  [phone] Pager # [phone]  10/25/2011 12:11 PM

## 2011-10-25 NOTE — Progress Notes (Signed)
UR Completed.  George Dickson Jane [phone] 10/25/2011  

## 2011-10-26 MED ORDER — ASPIRIN 81 MG PO CHEW: 81.0000 mg | CHEWABLE_TABLET | Freq: Every day | ORAL | Status: AC

## 2011-10-26 MED ORDER — LISINOPRIL 5 MG PO TABS: 5.0000 mg | ORAL_TABLET | Freq: Every day | ORAL | Status: DC

## 2011-10-26 MED ORDER — PRASUGREL HCL 10 MG PO TABS: 10.0000 mg | ORAL_TABLET | Freq: Every day | ORAL | Status: DC

## 2011-10-26 MED ORDER — ATORVASTATIN CALCIUM 40 MG PO TABS: 40.0000 mg | ORAL_TABLET | Freq: Every day | ORAL | Status: DC

## 2011-10-26 MED ORDER — NICOTINE 14 MG/24HR TD PT24: 1.0000 | MEDICATED_PATCH | Freq: Every day | TRANSDERMAL | Status: AC

## 2011-10-26 MED ORDER — METOPROLOL SUCCINATE ER 25 MG PO TB24: 25.0000 mg | ORAL_TABLET | Freq: Every day | ORAL | Status: DC

## 2011-10-26 MED ORDER — NITROGLYCERIN 0.4 MG SL SUBL: 0.4000 mg | SUBLINGUAL_TABLET | SUBLINGUAL | Status: DC | PRN

## 2011-10-26 MED ORDER — ACETAMINOPHEN 325 MG PO TABS: 650.0000 mg | ORAL_TABLET | ORAL | Status: AC | PRN

## 2011-10-26 NOTE — Discharge Summary (Signed)
Patient ID: George Dickson,  MRN: [id number], DOB/AGE: 06-13-1970 40 y.o.  Admit date: 10/23/2011 Discharge date: 10/26/2011  Primary Care Provider: Primary Cardiologist: Dr Herbie Baltimore  Discharge Diagnoses Principal Problem:  *STEMI (ST elevation myocardial infarction) Active Problems:  Tobacco use  Obesity, Class II, BMI 35-39.9  Presence of drug coated stent in right coronary artery - Promus 3.0 mm x 24 mm - post dilated to 3.3 mm     Procedures: RCA DES 10/23/11   Hospital Course: The patient is a 41 yo AA male with a history of tobacco use but no other significant PMH and no family history of CAD/MI that he is aware of. He presented with CP which began approximately 0730hrs 10/23/11 with radiation to the left arm, nausea, SOB and diaphoresis. STEMI was called by EMS on route to [facility]. Urgent cath revealed total RCA that was stented with a DES stent. He had no other significant CAD. LVF was NL by post MI echo. He can discharged to home 10/29/11.   Discharge Vitals:  Blood pressure 123/79, pulse 69, temperature 97.4 F (36.3 C), temperature source Oral, resp. rate 19, height 6\' 3"  (1.905 m), weight 136 kg (299 lb 13.2 oz), SpO2 98.00%.    Labs: No results found for this or any previous visit (from the past 48 hour(s)).  Disposition:  Follow-up Information    Follow up with Marykay Lex, MD on 10/26/2011. (office will call you)    Contact information:   [facility] [address], [address] [place] [address] [phone]          Discharge Medications:  Medication List  As of 10/26/2011 11:13 AM   TAKE these medications         acetaminophen 325 MG tablet   Commonly known as: TYLENOL   Take 2 tablets (650 mg total) by mouth every 4 (four) hours as needed.      aspirin 81 MG chewable tablet   Chew 1 tablet (81 mg total) by mouth daily.      atorvastatin 40 MG tablet   Commonly known as: LIPITOR   Take 1 tablet (40 mg total) by mouth daily  at 6 PM.      lisinopril 5 MG tablet   Commonly known as: PRINIVIL,ZESTRIL   Take 1 tablet (5 mg total) by mouth daily.      metoprolol succinate 25 MG 24 hr tablet   Commonly known as: TOPROL-XL   Take 1 tablet (25 mg total) by mouth daily.      nicotine 14 mg/24hr patch   Commonly known as: NICODERM CQ - dosed in mg/24 hours   Place 1 patch onto the skin daily.      nitroGLYCERIN 0.4 MG SL tablet   Commonly known as: NITROSTAT   Place 1 tablet (0.4 mg total) under the tongue every 5 (five) minutes as needed for chest pain.      prasugrel 10 MG Tabs   Commonly known as: EFFIENT   Take 1 tablet (10 mg total) by mouth daily.             Duration of Discharge Encounter: Greater than 30 minutes including physician time.  Jolene Provost PA-C 10/26/2011 11:13 AM

## 2011-10-26 NOTE — Progress Notes (Signed)
Subjective:  No complaints  Objective:  Vital Signs in the last 24 hours: Temp:  [97.4 F (36.3 C)-97.7 F (36.5 C)] 97.4 F (36.3 C) (06/04 0432) Pulse Rate:  [56-70] 69  (06/04 1017) Resp:  [18-19] 19  (06/04 0432) BP: (123-136)/(70-79) 123/79 mmHg (06/04 0432) SpO2:  [98 %-100 %] 98 % (06/04 0432)  Intake/Output from previous day:  Intake/Output Summary (Last 24 hours) at 10/26/11 1056 Last data filed at 10/25/11 1700  Gross per 24 hour  Intake    840 ml  Output    750 ml  Net     90 ml    Physical Exam: General appearance: alert, cooperative and no distress Lungs: clear to auscultation bilaterally Heart: regular rate and rhythm   Rate: 66  Rhythm: normal sinus rhythm  Lab Results:  Basename 10/24/11 0625  WBC 10.6*  HGB 15.9  PLT 179    Basename 10/24/11 0625  NA 138  K 3.8  CL 104  CO2 25  GLUCOSE 111*  BUN 8  CREATININE 0.90    Basename 10/24/11 0625 10/23/11 1939  TROPONINI 2.74* 5.58*   Hepatic Function Panel  Basename 10/24/11 0625  PROT 6.7  ALBUMIN 3.5  AST 39*  ALT 37  ALKPHOS 75  BILITOT 0.5  BILIDIR 0.1  IBILI 0.4    Basename 10/24/11 0625  CHOL 152   No results found for this basename: INR in the last 72 hours  Imaging: Imaging results have been reviewed No CXR done  Cardiac Studies: 2D Nl LVF  Assessment/Plan:   Principal Problem:  *STEMI (ST elevation myocardial infarction) Active Problems:  Tobacco use  Obesity, Class II, BMI 35-39.9  Presence of drug coated stent in right coronary artery - Promus 3.0 mm x 24 mm - post dilated to 3.3 mm    Plan- discharge later today, check CXR before discharge.   Corine Shelter PA-C 10/26/2011, 10:56 AM    Agree with note written by Corine Shelter [facility]  Fast track DMI. DES RCA. No CP/SOB. Exam benign. Groin OK. D/C home on ASA and effient. ROV with Dr. Dwaine Deter 1-2 weeks  Nanetta Batty J 10/26/2011 1:16 PM

## 2011-10-26 NOTE — Progress Notes (Signed)
CARDIAC REHAB PHASE I   PRE:  Rate/Rhythm: 58SB  BP:  Supine: 120/90  Sitting:   Standing:    SaO2:   MODE:  Ambulation: 1230 ft   POST:  Rate/Rhythem: 62  BP:  Supine:   Sitting: 152/90  Standing:    SaO2: 100%RA 1142-1200 Pt had not walked today. Walked 1230 ft on RA with steady gait. Tolerated well. Denied chest pain. Pt stated no questions from education yesterday.  George Dickson

## 2011-10-26 NOTE — Discharge Instructions (Signed)
Myocardial Infarction A myocardial infarction (MI) is damage to the heart that is not reversible. It is also called a heart attack. An MI usually occurs when a heart (coronary) artery becomes blocked or narrowed. This cuts off the blood supply to the heart. When one or more of the heart (coronary) arteries becomes blocked, that area of the heart begins to die. This causes pain felt during an MI.  If you think you might be having an MI, call your local emergency services immediately (911 in [place] It is recommended that you take a 162 mg non-enteric coated aspirin if you do not have an aspirin allergy. Do not drive yourself to the hospital or wait to see if your symptoms go away. The sooner MI is treated, the greater the amount of heart muscle saved. Time is muscle. It can save your life. CAUSES  An MI can occur from:  A gradual buildup of a fatty substance called plaque. When plaque builds up in the arteries, this condition is called atherosclerosis. This buildup can block or reduce the blood supply to the heart artery(s).   A sudden plaque rupture within a heart artery that causes a blood clot (thrombus). A blood clot can block the heart artery which does not allow blood flow to the heart.   A severe tightening (spasm) of the heart artery. This is a less common cause of a heart attack. When a heart artery spasms, it cuts off blood flow through the artery. Spasms can occur in heart arteries that do not have atherosclerosis.  RISK FACTORS People at risk for an MI usually have one or more risk factors, such as:  High blood pressure.   High cholesterol.   Smoking.   Gender. Men have a higher heart attack risk.   Overweight/obesity.   Age.   Family history.   Lack of exercise.   Diabetes.   Stress.   Excessive alcohol use.   Street drug use (cocaine and methamphetamines).  SYMPTOMS  MI symptoms can vary, such as:  In both men and women, MI symptoms can include the following:    Chest pain. The chest pain may feel like a crushing, squeezing, or "pressure" type feeling. MI pain can be "referred," meaning pain can be caused in one part of the body but felt in another part of the body. Referred MI pain may occur in the left arm, neck, or jaw. Pain may even be felt in the right arm.   Shortness of breath (dyspnea).   Heartburn or indigestion with or without vomiting, shortness of breath, or sweating (diaphoresis).   Sudden, cold sweats.   Sudden lightheadedness.   Upper back pain.   Women can have unique MI symptoms, such as:   Unexplained feelings of nervousness or anxiety.   Discomfort between the shoulder blades (scapula) or upper back.   Tingling in the hands and arms.   In elderly people (regardless of gender), MI symptoms can be subtle, such as:   Sweating (diaphoresis).   Shortness of breath (dyspnea).   General tiredness (fatigue) or not feeling well (malaise).  DIAGNOSIS  Diagnosis of an MI involves several tests such as:  An assessment of your vital signs such as heart rhythm, blood pressure, respiratory rate, and oxygen level.   An EKG (ECG) to look at the electrical activity of your heart.   Blood tests called cardiac markers are drawn at scheduled times to measure proteins or enzymes released by the damaged heart muscle.   A chest   X-ray.   An echocardiogram to evaluate heart motion and blood flow.   Coronary angiography (cardiac catheterization). This is a diagnostic procedure to look at the heart arteries.  TREATMENT  Acute Intervention. For an MI, the national standard in the [place] is to have an acute intervention in under 90 minutes from the time you get to the hospital. An acute intervention is a special procedure to open up the heart arteries. It is done in a treatment room called a "catheterization lab" (cath lab). Some hospitals do no have a cath lab. If you are having an MI and the hospital does not have a cath lab, the  standard is to transport you to a hospital that has one. In the cath lab, acute intervention includes:  Angioplasty. An angioplasty involves inserting a thin, flexible tube (catheter) into an artery in either your groin or wrist. The catheter is threaded to the heart arteries. A balloon at the end of the catheter is inflated to open a narrowed or blocked heart artery. During an angioplasty procedure, a small mesh tube (stent) may be used to keep the heart artery open. Depending on your condition and health history, one of two types of stents may be placed:   Drug-eluting stent (DES). A DES is coated with a medicine to prevent scar tissue from growing over the stent. With drug-eluting stents, blood thinning medicine will need to be taken for up to a year.   Bare metal stent. This type of stent has no special coating to keep tissue from growing over it. This type of stent is used if you cannot take blood thinning medicine for a prolonged time or you need surgery in the near future. After a bare metal stent is placed, blood thinning medicine will need to be taken for about a month.   If you are taking blood thinning medicine (anti-platelet therapy) after stent placement, do not stop taking it unless your caregiver says it is okay to do so. Make sure you understand how long you need to take the medicine.  Surgical Intervention  If an acute intervention is not successful, surgery may be needed:   Open heart surgery (coronary artery bypass graft, CABG). CABG takes a vein (saphenous vein) from your leg. The vein is then attached to the blocked heart artery which bypasses the blockage. This then allows blood flow to the heart muscle.  Additional Interventions  A "clot buster" medicine (thrombolytic) may be given. This medicine can help break up a clot in the heart artery. This medicine may be given if a person cannot get to a cath lab right away.   Intra-aortic balloon pump (IABP). If you have suffered a  very severe MI and are too unstable to go to the cath lab or to surgery, an IABP may be used. This is a temporary mechanical device used to increase blood flow to the heart and reduce the workload of the heart until you are stable enough to go to the cath lab or surgery.  HOME CARE INSTRUCTIONS After an MI, you may need the following:  Medication. Take medication as directed by your caregiver. Medications after an MI may:   Keep your blood from clotting easily (blood thinners).   Control your blood pressure.   Help lower your cholesterol.   Control abnormal heart rhythms.   Lifestyle changes. Under the guidance of your caregiver, lifestyle changes include:   Quitting smoking, if you smoke. Your caregiver can help you quit.   Being   physically active.   Maintaining a healthy weight.   Eating a heart healthy diet. A dietician can help you learn healthy eating options.   Managing diabetes.   Reducing stress.   Limiting alcohol intake.  SEEK IMMEDIATE MEDICAL CARE IF:   You have severe chest pain, especially if the pain is crushing or pressure-like and spreads to the arms, back, neck, or jaw. This is an emergency. Do not wait to see if the pain will go away. Get medical help at once. Call your local emergency services (911 in the [place] Do not drive yourself to the hospital.   You have shortness of breath during rest, sleep, or with activity.   You have sudden sweating or clammy skin.   You feel sick to your stomach (nauseous) and throw up (vomit).   You suddenly become lightheaded or dizzy.   You feel your heart beating rapidly or you notice "skipped" beats.  MAKE SURE YOU:   Understand these instructions.   Will watch your condition.   Will get help right away if you are not doing well or get worse.  Document Released: 05/10/2005 Document Revised: 04/29/2011 Document Reviewed: 10/07/2010 [employer] Patient Information 2012 [place] Dally To  Work __________________________________________________ was treated at our facility. 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

## 2011-10-27 MED FILL — Nitroglycerin IV Soln 200 MCG/ML in D5W: INTRAVENOUS | Qty: 250 | Status: AC

## 2011-10-27 MED FILL — Heparin Sodium (Porcine) Inj 5000 Unit/ML: INTRAMUSCULAR | Qty: 1 | Status: AC

## 2011-12-06 DIAGNOSIS — G4733 Obstructive sleep apnea (adult) (pediatric): Secondary | ICD-10-CM

## 2011-12-06 HISTORY — DX: Obstructive sleep apnea (adult) (pediatric): G47.33

## 2013-04-17 ENCOUNTER — Encounter (HOSPITAL_COMMUNITY): Payer: Self-pay | Admitting: Emergency Medicine

## 2013-04-17 ENCOUNTER — Emergency Department (HOSPITAL_COMMUNITY): Payer: 59

## 2013-04-17 ENCOUNTER — Emergency Department (HOSPITAL_COMMUNITY)
Admission: EM | Admit: 2013-04-17 | Discharge: 2013-04-17 | Disposition: A | Discharge status: Home / Self Care | Payer: 59 | Attending: Emergency Medicine | Admitting: Emergency Medicine

## 2013-04-17 DIAGNOSIS — F17200 Nicotine dependence, unspecified, uncomplicated: Secondary | ICD-10-CM | POA: Insufficient documentation

## 2013-04-17 DIAGNOSIS — R209 Unspecified disturbances of skin sensation: Secondary | ICD-10-CM | POA: Insufficient documentation

## 2013-04-17 DIAGNOSIS — Z7982 Long term (current) use of aspirin: Secondary | ICD-10-CM | POA: Insufficient documentation

## 2013-04-17 DIAGNOSIS — I252 Old myocardial infarction: Secondary | ICD-10-CM | POA: Insufficient documentation

## 2013-04-17 DIAGNOSIS — Z9861 Coronary angioplasty status: Secondary | ICD-10-CM | POA: Insufficient documentation

## 2013-04-17 DIAGNOSIS — I10 Essential (primary) hypertension: Secondary | ICD-10-CM | POA: Insufficient documentation

## 2013-04-17 HISTORY — DX: Essential (primary) hypertension: I10

## 2013-04-17 LAB — POCT I-STAT TROPONIN I: Troponin i, poc: 0 ng/mL (ref 0.00–0.08)

## 2013-04-17 LAB — CBC
HCT: 50.7 % (ref 39.0–52.0)
MCV: 86.2 fL (ref 78.0–100.0)
Platelets: 190 10*3/uL (ref 150–400)
RBC: 5.88 MIL/uL — ABNORMAL HIGH (ref 4.22–5.81)
WBC: 8.8 10*3/uL (ref 4.0–10.5)

## 2013-04-17 LAB — BASIC METABOLIC PANEL
BUN: 13 mg/dL (ref 6–23)
CO2: 23 mEq/L (ref 19–32)
Chloride: 106 mEq/L (ref 96–112)
Creatinine, Ser: 0.84 mg/dL (ref 0.50–1.35)

## 2013-04-17 NOTE — ED Notes (Signed)
Pt arrives via EMS from workplace where he wanted to be evaluated by employee health regarding his HBP. States has been off meds for the last year. States for the last week left arm discomfort. Hx of 1 stent. EMS placed 20G Rhand, given 4 baby ASA, 1 nitro with total relief of arm discomfort. Pt denies pain at present. Vss. Placed on cardiac monitor.

## 2013-04-17 NOTE — ED Provider Notes (Signed)
CSN: [id number]     Arrival date & time 04/17/13  1335 History   First MD Initiated Contact with Patient 04/17/13 1445     Chief Complaint  Patient presents with  . Chest Pain   (Consider location/radiation/quality/duration/timing/severity/associated sxs/prior Treatment) Patient is a 42 y.o. male presenting with hypertension. The history is provided by the patient. No language interpreter was used.  Hypertension This is a recurrent problem. Pertinent negatives include no chest pain, chills or fever. Associated symptoms comments: He reports going to have dental work last week and was found to have high blood pressure and the procedure was deferred. He has been on blood pressure medication in the past but did not follow up when his prescription ran out. 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 He repeatedly denies having any chest pain today. He started having numbness into his left arm today and decided to present here given his elevated blood pressure last week. No chest pain today, no nausea, diaphoresis. He continues to smoke. Marland Kitchen    Past Medical History  Diagnosis Date  . No pertinent past medical history   . Stented coronary artery   . Hypertension    History reviewed. No pertinent past surgical history. Family History  Problem Relation Age of Onset  . Coronary artery disease Mother    History  Substance Use Topics  . Smoking status: Current Every Day Smoker -- 1.50 packs/day for 4 years    Types: Cigars, Cigarettes  . Smokeless tobacco: Never Used  . Alcohol Use: 14.4 oz/week    24 Cans of beer per week    Review of Systems  Constitutional: Negative for fever and chills.  Respiratory: Negative.  Negative for shortness of breath.   Cardiovascular: Negative.  Negative for chest pain.  Gastrointestinal: Negative.   Musculoskeletal: Negative.   Skin: Negative.   Neurological: Negative.     Allergies  Review of patient's  allergies indicates no known allergies.  Home Medications   Current Outpatient Rx  Name  Route  Sig  Dispense  Refill  . aspirin EC 81 MG tablet   Oral   Take 243 mg by mouth daily.          BP 140/90  Pulse 62  Temp(Src) 98.4 F (36.9 C) (Oral)  Resp 17  SpO2 100% Physical Exam  Constitutional: He is oriented to person, place, and time. He appears well-developed and well-nourished.  HENT:  Head: Normocephalic.  Neck: Normal range of motion. Neck supple.  Cardiovascular: Normal rate and regular rhythm.   Pulmonary/Chest: Effort normal and breath sounds normal. He exhibits no tenderness.  Abdominal: Soft. Bowel sounds are normal. There is no tenderness. There is no rebound and no guarding.  Musculoskeletal: Normal range of motion.  Neurological: He is alert and oriented to person, place, and time.  Skin: Skin is warm and dry. No rash noted.  Psychiatric: He has a normal mood and affect.    ED Course  Procedures (including critical care time) Labs Review Labs Reviewed  CBC - Abnormal; Notable for the following:    RBC 5.88 (*)    Hemoglobin 17.4 (*)    All other components within normal limits  BASIC METABOLIC PANEL - Abnormal; Notable for the following:    Glucose, Bld 103 (*)    All other components within normal limits  POCT I-STAT TROPONIN I   Imaging Review Dg Chest Portable 1 View  04/17/2013   CLINICAL DATA:  Chest pain  EXAM: PORTABLE CHEST - 1 VIEW  COMPARISON:  None.  FINDINGS: The lungs are not optimally aerated. No active infiltrate or effusion is seen. Mediastinal contours appear normal. The heart is within normal limits in size. No bony abnormality is seen.  IMPRESSION: No active lung disease.   Electronically Signed   By: Dwyane Dee M.D.   On: 04/17/2013 15:29    EKG Interpretation    Date/Time:  Tuesday April 17 2013 13:44:28 EST Ventricular Rate:  65 PR Interval:  175 QRS Duration: 113 QT Interval:  385 QTC Calculation: 400 R  Axis:   -25 Text Interpretation:  Age not entered, assumed to be  42 years old for purpose of ECG interpretation Sinus rhythm Borderline intraventricular conduction delay ST elevation suggests acute pericarditis Confirmed by RAY MD, DANIELLE ([id number]) on 04/17/2013 4:07:05 PM            MDM  No diagnosis found. 1. Hypertension  Troponin x 2 negative, nonacute, unchanged EKG - doubt ACS. Blood pressure minimally elevated in ED. Recommended follow up with Dr. Dorethea Clan (or with cardiology of his choice) for further outpatient recheck and medication Rxs.    Arnoldo Hooker, PA-C 04/18/13 2137

## 2013-04-20 NOTE — ED Provider Notes (Signed)
History/physical exam/procedure(s) were performed by non-physician practitioner and as supervising physician I was immediately available for consultation/collaboration. I have reviewed all notes and am in agreement with care and plan.   Hilario Quarry, MD 04/20/13 [phone]

## 2013-07-27 ENCOUNTER — Emergency Department (HOSPITAL_COMMUNITY)
Admission: EM | Admit: 2013-07-27 | Discharge: 2013-07-27 | Disposition: A | Discharge status: Home / Self Care | Payer: 59 | Attending: Emergency Medicine | Admitting: Emergency Medicine

## 2013-07-27 ENCOUNTER — Encounter (HOSPITAL_COMMUNITY): Payer: Self-pay | Admitting: Emergency Medicine

## 2013-07-27 DIAGNOSIS — Z7982 Long term (current) use of aspirin: Secondary | ICD-10-CM | POA: Insufficient documentation

## 2013-07-27 DIAGNOSIS — M5430 Sciatica, unspecified side: Secondary | ICD-10-CM

## 2013-07-27 DIAGNOSIS — Z7902 Long term (current) use of antithrombotics/antiplatelets: Secondary | ICD-10-CM | POA: Insufficient documentation

## 2013-07-27 DIAGNOSIS — I10 Essential (primary) hypertension: Secondary | ICD-10-CM | POA: Insufficient documentation

## 2013-07-27 DIAGNOSIS — Z9861 Coronary angioplasty status: Secondary | ICD-10-CM | POA: Insufficient documentation

## 2013-07-27 DIAGNOSIS — M549 Dorsalgia, unspecified: Secondary | ICD-10-CM

## 2013-07-27 DIAGNOSIS — F17200 Nicotine dependence, unspecified, uncomplicated: Secondary | ICD-10-CM | POA: Insufficient documentation

## 2013-07-27 DIAGNOSIS — Z79899 Other long term (current) drug therapy: Secondary | ICD-10-CM | POA: Insufficient documentation

## 2013-07-27 MED ORDER — PREDNISONE 20 MG PO TABS: 40.0000 mg | ORAL_TABLET | Freq: Every day | ORAL | Status: DC

## 2013-07-27 MED ORDER — DIAZEPAM 5 MG PO TABS
5.0000 mg | ORAL_TABLET | Freq: Once | ORAL | Status: AC
  Administered 2013-07-27: 5 mg via ORAL
  Filled 2013-07-27: qty 1

## 2013-07-27 MED ORDER — HYDROCODONE-ACETAMINOPHEN 5-325 MG PO TABS: 1.0000 | ORAL_TABLET | Freq: Four times a day (QID) | ORAL | Status: DC | PRN

## 2013-07-27 MED ORDER — HYDROCODONE-ACETAMINOPHEN 5-325 MG PO TABS
2.0000 | ORAL_TABLET | Freq: Once | ORAL | Status: AC
  Administered 2013-07-27: 2 via ORAL
  Filled 2013-07-27: qty 2

## 2013-07-27 NOTE — ED Notes (Signed)
Noted all documentation at nurses station.  Computer in patient room not working.

## 2013-07-27 NOTE — ED Notes (Signed)
Patient states he has chronic back pain that is now running down into his R leg.  Patient states his leg "get's numb sometimes.  But it hurts real bad now".

## 2013-07-27 NOTE — Discharge Instructions (Signed)
You need to follow-up with a spine specialist.  Contact info. is listed above.  Please take you medications as prescribed.    Back Exercises These exercises may help you when beginning to rehabilitate your injury. Your symptoms may resolve with or without further involvement from your physician, physical therapist or athletic trainer. While completing these exercises, remember:   Restoring tissue flexibility helps normal motion to return to the joints. This allows healthier, less painful movement and activity.  An effective stretch should be held for at least 30 seconds.  A stretch should never be painful. You should only feel a gentle lengthening or release in the stretched tissue. STRETCH  Extension, Prone on Elbows   Lie on your stomach on the floor, a bed will be too soft. Place your palms about shoulder width apart and at the height of your head.  Place your elbows under your shoulders. If this is too painful, stack pillows under your chest.  Allow your body to relax so that your hips drop lower and make contact more completely with the floor.  Hold this position for __________ seconds.  Slowly return to lying flat on the floor. Repeat __________ times. Complete this exercise __________ times per day.  RANGE OF MOTION  Extension, Prone Press Ups   Lie on your stomach on the floor, a bed will be too soft. Place your palms about shoulder width apart and at the height of your head.  Keeping your back as relaxed as possible, slowly straighten your elbows while keeping your hips on the floor. You may adjust the placement of your hands to maximize your comfort. As you gain motion, your hands will come more underneath your shoulders.  Hold this position __________ seconds.  Slowly return to lying flat on the floor. Repeat __________ times. Complete this exercise __________ times per day.  RANGE OF MOTION- Quadruped, Neutral Spine   Assume a hands and knees position on a firm surface.  Keep your hands under your shoulders and your knees under your hips. You may place padding under your knees for comfort.  Drop your head and point your tail bone toward the ground below you. This will round out your low back like an angry cat. Hold this position for __________ seconds.  Slowly lift your head and release your tail bone so that your back sags into a large arch, like an old horse.  Hold this position for __________ seconds.  Repeat this until you feel limber in your low back.  Now, find your "sweet spot." This will be the most comfortable position somewhere between the two previous positions. This is your neutral spine. Once you have found this position, tense your stomach muscles to support your low back.  Hold this position for __________ seconds. Repeat __________ times. Complete this exercise __________ times per day.  STRETCH  Flexion, Single Knee to Chest   Lie on a firm bed or floor with both legs extended in front of you.  Keeping one leg in contact with the floor, bring your opposite knee to your chest. Hold your leg in place by either grabbing behind your thigh or at your knee.  Pull until you feel a gentle stretch in your low back. Hold __________ seconds.  Slowly release your grasp and repeat the exercise with the opposite side. Repeat __________ times. Complete this exercise __________ times per day.  STRETCH - Hamstrings, Standing  Stand or sit and extend your right / left leg, placing your foot on a  chair or foot stool  Keeping a slight arch in your low back and your hips straight forward.  Lead with your chest and lean forward at the waist until you feel a gentle stretch in the back of your right / left knee or thigh. (When done correctly, this exercise requires leaning only a small distance.)  Hold this position for __________ seconds. Repeat __________ times. Complete this stretch __________ times per day. STRENGTHENING  Deep Abdominals, Pelvic Tilt     Lie on a firm bed or floor. Keeping your legs in front of you, bend your knees so they are both pointed toward the ceiling and your feet are flat on the floor.  Tense your lower abdominal muscles to press your low back into the floor. This motion will rotate your pelvis so that your tail bone is scooping upwards rather than pointing at your feet or into the floor.  With a gentle tension and even breathing, hold this position for __________ seconds. Repeat __________ times. Complete this exercise __________ times per day.  STRENGTHENING  Abdominals, Crunches   Lie on a firm bed or floor. Keeping your legs in front of you, bend your knees so they are both pointed toward the ceiling and your feet are flat on the floor. Cross your arms over your chest.  Slightly tip your chin down without bending your neck.  Tense your abdominals and slowly lift your trunk high enough to just clear your shoulder blades. Lifting higher can put excessive stress on the low back and does not further strengthen your abdominal muscles.  Control your return to the starting position. Repeat __________ times. Complete this exercise __________ times per day.  STRENGTHENING  Quadruped, Opposite UE/LE Lift   Assume a hands and knees position on a firm surface. Keep your hands under your shoulders and your knees under your hips. You may place padding under your knees for comfort.  Find your neutral spine and gently tense your abdominal muscles so that you can maintain this position. Your shoulders and hips should form a rectangle that is parallel with the floor and is not twisted.  Keeping your trunk steady, lift your right hand no higher than your shoulder and then your left leg no higher than your hip. Make sure you are not holding your breath. Hold this position __________ seconds.  Continuing to keep your abdominal muscles tense and your back steady, slowly return to your starting position. Repeat with the opposite  arm and leg. Repeat __________ times. Complete this exercise __________ times per day. Document Released: 05/28/2005 Document Revised: 08/02/2011 Document Reviewed: 08/22/2008 [facility] Patient Information 2014 [place], [place]. Sciatica Sciatica is pain, weakness, numbness, or tingling along the path of the sciatic nerve. The nerve starts in the lower back and runs down the back of each leg. The nerve controls the muscles in the lower leg and in the back of the knee, while also providing sensation to the back of the thigh, lower leg, and the sole of your foot. Sciatica is a symptom of another medical condition. For instance, nerve damage or certain conditions, such as a herniated disk or bone spur on the spine, pinch or put pressure on the sciatic nerve. This causes the pain, weakness, or other sensations normally associated with sciatica. Generally, sciatica only affects one side of the body. CAUSES   Herniated or slipped disc.  Degenerative disk disease.  A pain disorder involving the narrow muscle in the buttocks (piriformis syndrome).  Pelvic injury or fracture.  Pregnancy.  Tumor (rare). SYMPTOMS  Symptoms can vary from mild to very severe. The symptoms usually travel from the low back to the buttocks and down the back of the leg. Symptoms can include:  Mild tingling or dull aches in the lower back, leg, or hip.  Numbness in the back of the calf or sole of the foot.  Burning sensations in the lower back, leg, or hip.  Sharp pains in the lower back, leg, or hip.  Leg weakness.  Severe back pain inhibiting movement. These symptoms may get worse with coughing, sneezing, laughing, or prolonged sitting or standing. Also, being overweight may worsen symptoms. DIAGNOSIS  Your caregiver will perform a physical exam to look for common symptoms of sciatica. He or she may ask you to do certain movements or activities that would trigger sciatic nerve pain. Other tests may be performed to  find the cause of the sciatica. These may include:  Blood tests.  X-rays.  Imaging tests, such as an MRI or CT scan. TREATMENT  Treatment is directed at the cause of the sciatic pain. Sometimes, treatment is not necessary and the pain and discomfort goes away on its own. If treatment is needed, your caregiver may suggest:  Over-the-counter medicines to relieve pain.  Prescription medicines, such as anti-inflammatory medicine, muscle relaxants, or narcotics.  Applying heat or ice to the painful area.  Steroid injections to lessen pain, irritation, and inflammation around the nerve.  Reducing activity during periods of pain.  Exercising and stretching to strengthen your abdomen and improve flexibility of your spine. Your caregiver may suggest losing weight if the extra weight makes the back pain worse.  Physical therapy.  Surgery to eliminate what is pressing or pinching the nerve, such as a bone spur or part of a herniated disk. HOME CARE INSTRUCTIONS   Only take over-the-counter or prescription medicines for pain or discomfort as directed by your caregiver.  Apply ice to the affected area for 20 minutes, 3 4 times a day for the first 48 72 hours. Then try heat in the same way.  Exercise, stretch, or perform your usual activities if these do not aggravate your pain.  Attend physical therapy sessions as directed by your caregiver.  Keep all follow-up appointments as directed by your caregiver.  Do not wear high heels or shoes that do not provide proper support.  Check your mattress to see if it is too soft. A firm mattress may lessen your pain and discomfort. SEEK IMMEDIATE MEDICAL CARE IF:   You lose control of your bowel or bladder (incontinence).  You have increasing weakness in the lower back, pelvis, buttocks, or legs.  You have redness or swelling of your back.  You have a burning sensation when you urinate.  You have pain that gets worse when you lie down or  awakens you at night.  Your pain is worse than you have experienced in the past.  Your pain is lasting longer than 4 weeks.  You are suddenly losing weight without reason. MAKE SURE YOU:  Understand these instructions.  Will watch your condition.  Will get help right away if you are not doing well or get worse. Document Released: 05/04/2001 Document Revised: 11/09/2011 Document Reviewed: 09/19/2011 [facility] Patient Information 2014 [place].

## 2013-07-27 NOTE — ED Provider Notes (Signed)
Medical screening examination/treatment/procedure(s) were performed by non-physician practitioner and as supervising physician I was immediately available for consultation/collaboration.   EKG Interpretation None      Rolland Porter, MD, Abram Sander   Janice Norrie, MD 07/27/13 1325

## 2013-07-27 NOTE — ED Provider Notes (Signed)
CSN: [id number]     Arrival date & time 07/27/13  0808 History   First MD Initiated Contact with Patient 07/27/13 [phone]     Chief Complaint  Patient presents with  . Leg Pain     (Consider location/radiation/quality/duration/timing/severity/associated sxs/prior Treatment) HPI Comments: Patient presents to the emergency department with chief complaint of low back pain, and right leg pain. He states the pain is back radiates to the right leg. This is not a new problem for him. Patient does state that the pain is worsening. Denies bowel or bladder incontinence. Denies any saddle anesthesia. He states that he is able to ambulate, but it hurts. States pain is moderate to severe. Denies any ataxia. He denies any fevers, chills, nausea, vomiting, diarrhea, or constipation.  The history is provided by the patient. No language interpreter was used.    Past Medical History  Diagnosis Date  . No pertinent past medical history   . Stented coronary artery   . Hypertension    History reviewed. No pertinent past surgical history. Family History  Problem Relation Age of Onset  . Coronary artery disease Mother    History  Substance Use Topics  . Smoking status: Current Every Day Smoker -- 1.50 packs/day for 4 years    Types: Cigars, Cigarettes  . Smokeless tobacco: Never Used  . Alcohol Use: 14.4 oz/week    24 Cans of beer per week    Review of Systems  Constitutional: Negative for fever and chills.  Gastrointestinal:       No bowel incontinence  Genitourinary:       No urinary incontinence  Musculoskeletal: Positive for arthralgias, back pain and myalgias.  Neurological:       No saddle anesthesia      Allergies  Review of patient's allergies indicates no known allergies.  Home Medications   Current Outpatient Rx  Name  Route  Sig  Dispense  Refill  . aspirin EC 81 MG tablet   Oral   Take 243 mg by mouth daily.         Marland Kitchen atorvastatin (LIPITOR) 40 MG tablet   Oral   Take  40 mg by mouth daily.         . clopidogrel (PLAVIX) 75 MG tablet   Oral   Take 75 mg by mouth daily.         Marland Kitchen ibuprofen (ADVIL,MOTRIN) 200 MG tablet   Oral   Take 800 mg by mouth every 6 (six) hours as needed.         . metoprolol tartrate (LOPRESSOR) 25 MG tablet   Oral   Take 25 mg by mouth daily.          BP 147/88  Pulse 90  Temp(Src) 97.5 F (36.4 C) (Oral)  Resp 18  SpO2 98% Physical Exam  Nursing note and vitals reviewed. Constitutional: He is oriented to person, place, and time. He appears well-developed and well-nourished. No distress.  HENT:  Head: Normocephalic and atraumatic.  Eyes: Conjunctivae and EOM are normal. Right eye exhibits no discharge. Left eye exhibits no discharge. No scleral icterus.  Neck: Normal range of motion. Neck supple. No tracheal deviation present.  Cardiovascular: Normal rate, regular rhythm and normal heart sounds.  Exam reveals no gallop and no friction rub.   No murmur heard. Pulmonary/Chest: Effort normal and breath sounds normal. No respiratory distress. He has no wheezes.  Abdominal: Soft. He exhibits no distension. There is no tenderness.  Musculoskeletal: Normal range of  motion.  Lumbar paraspinal muscles tender to palpation, no bony tenderness, step-offs, or gross abnormality or deformity of spine, patient is able to ambulate, moves all extremities  Bilateral great toe extension intact Bilateral plantar/dorsiflexion intact  Neurological: He is alert and oriented to person, place, and time. He has normal reflexes.  Sensation and strength intact bilaterally Symmetrical reflexes  Skin: Skin is warm. He is not diaphoretic.  Psychiatric: He has a normal mood and affect. His behavior is normal. Judgment and thought content normal.    ED Course  Procedures (including critical care time) Labs Review Labs Reviewed - No data to display Imaging Review No results found.   EKG Interpretation None      MDM   Final  diagnoses:  Sciatica  Back pain    Patient with back pain.  No neurological deficits and normal neuro exam.  Patient is ambulatory.  No loss of bowel or bladder control.  Doubt cauda equina.  Denies fever,  doubt epidural abscess or other lesion. Recommend back exercises, stretching, RICE, and will treat with a short course of prednisone and vicodin.  Recommend neurosurgery follow-up.   Montine Circle, PA-C 07/27/13 [phone]

## 2013-08-02 ENCOUNTER — Other Ambulatory Visit: Payer: Self-pay | Admitting: Family Medicine

## 2013-08-02 DIAGNOSIS — M545 Low back pain, unspecified: Secondary | ICD-10-CM

## 2013-08-03 ENCOUNTER — Other Ambulatory Visit: Payer: Self-pay | Admitting: Family Medicine

## 2013-08-03 DIAGNOSIS — Z139 Encounter for screening, unspecified: Secondary | ICD-10-CM

## 2013-08-05 ENCOUNTER — Ambulatory Visit
Admission: RE | Admit: 2013-08-05 | Discharge: 2013-08-05 | Disposition: A | Discharge status: Home / Self Care | Payer: 59 | Source: Ambulatory Visit | Attending: Family Medicine | Admitting: Family Medicine

## 2013-08-05 DIAGNOSIS — Z139 Encounter for screening, unspecified: Secondary | ICD-10-CM

## 2013-08-05 DIAGNOSIS — M545 Low back pain, unspecified: Secondary | ICD-10-CM

## 2013-08-05 IMAGING — CR DG ORBITS FOR FOREIGN BODY
2 series · 2 of 2 positions shown · non-contrast
Comparison: None.

CLINICAL DATA: Metal working/exposure; clearance prior to MRI

EXAM:
ORBITS FOR FOREIGN BODY - 2 VIEW

[view not recorded (1 of 2)]
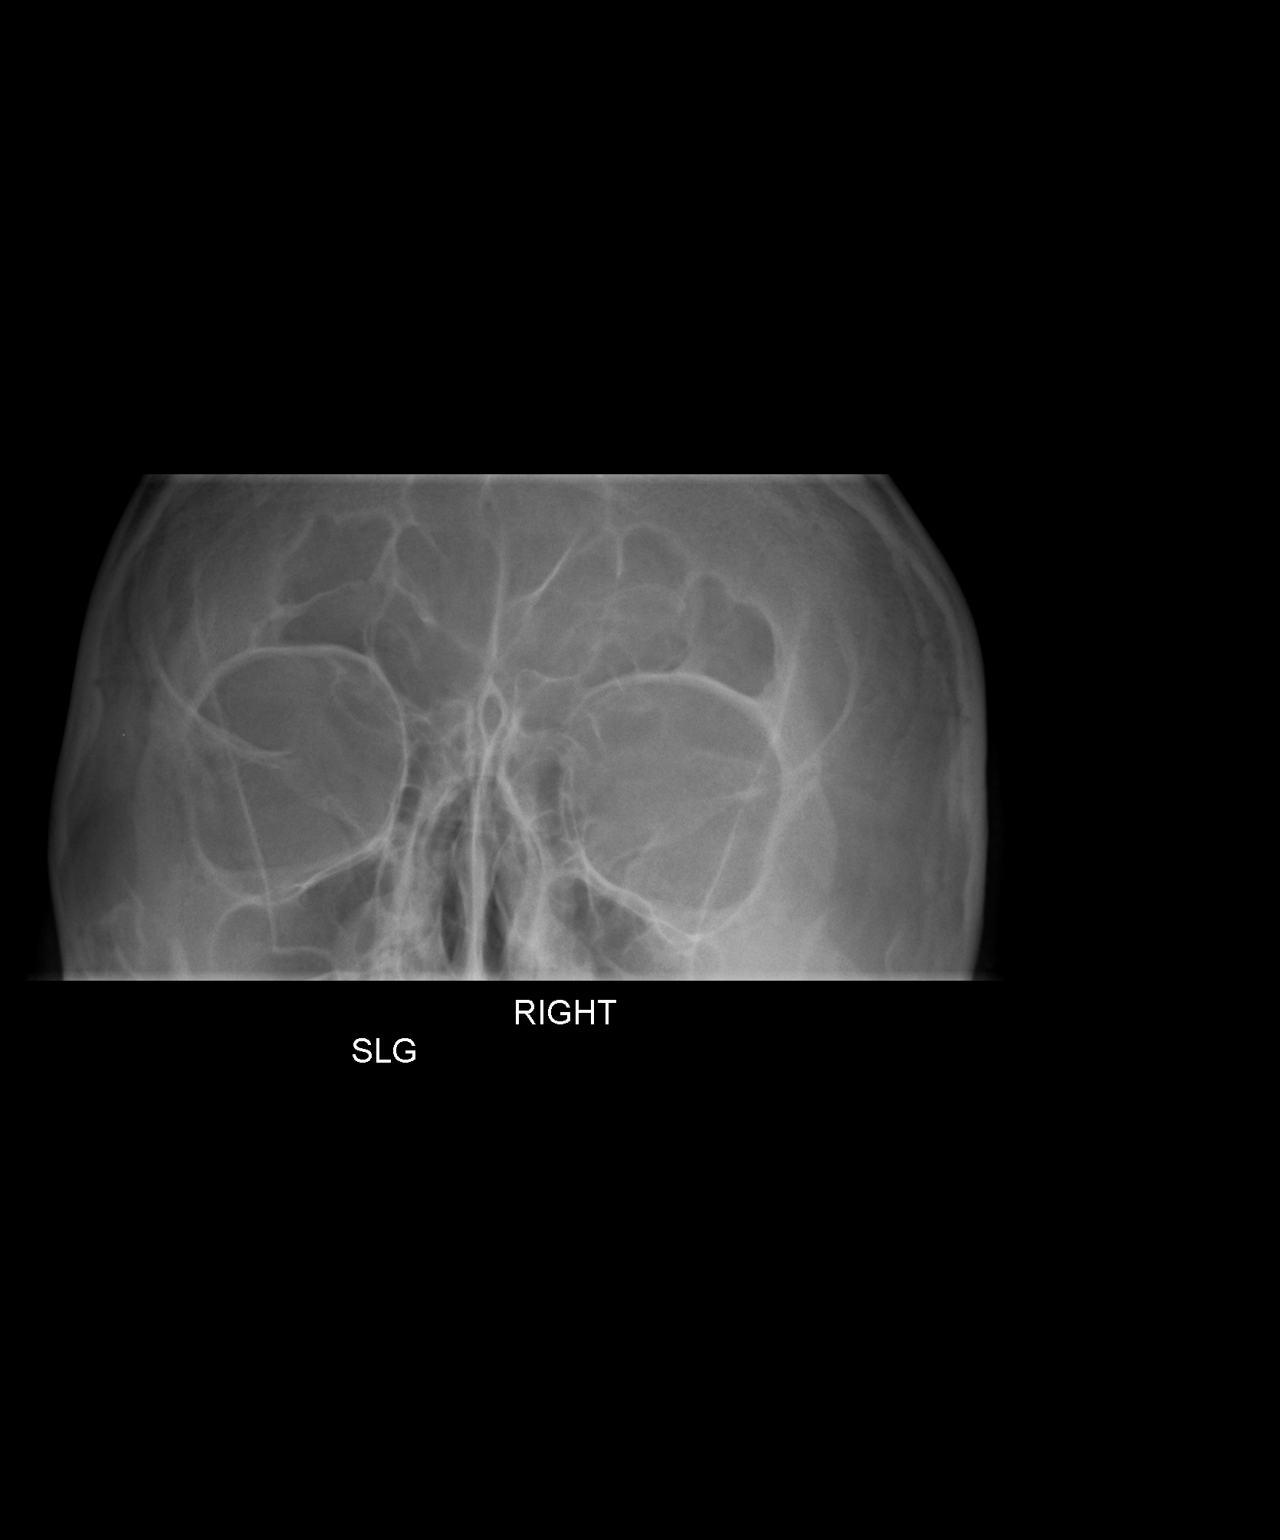

[view not recorded (2 of 2)]
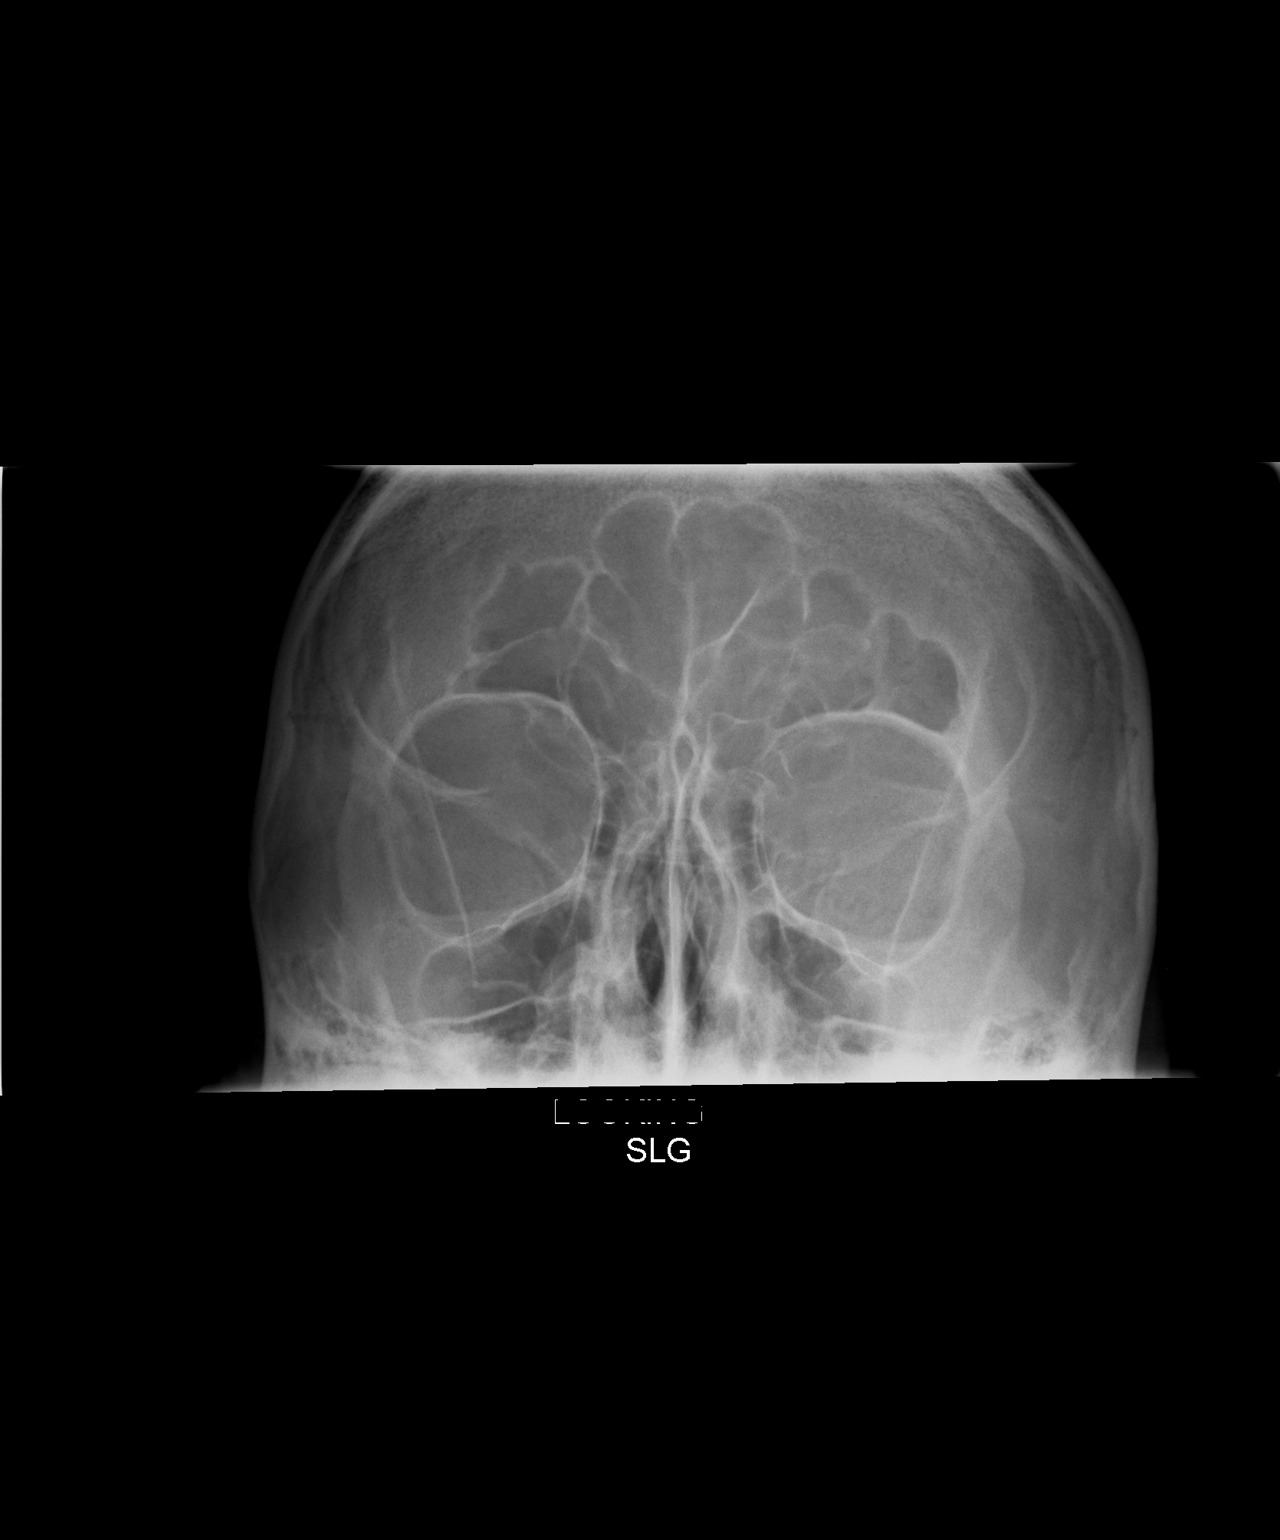

[2 of 2 positions shown; findings below may reference images not displayed]

FINDINGS: There is no evidence of metallic foreign body within the orbits. No
significant bone abnormality identified.
IMPRESSION: No evidence of metallic foreign body within the orbits.

## 2013-08-09 ENCOUNTER — Other Ambulatory Visit: Payer: Self-pay | Admitting: Neurosurgery

## 2013-08-10 ENCOUNTER — Telehealth: Payer: Self-pay | Admitting: *Deleted

## 2013-08-10 ENCOUNTER — Encounter: Payer: Self-pay | Admitting: Cardiology

## 2013-08-10 ENCOUNTER — Encounter: Payer: Self-pay | Admitting: *Deleted

## 2013-08-10 NOTE — Telephone Encounter (Signed)
Pt needs to have a cardiac clearance done today. He is on surgery schedule for Tuesday per [facility] @ Dr. Priscella Mann office.  [place]

## 2013-08-10 NOTE — Telephone Encounter (Signed)
Message forwarded to Dr. Ellyn Hack [facility]).

## 2013-08-10 NOTE — Telephone Encounter (Signed)
Spoke w/ Lorriane Shire.  Stated pt is scheduled to be seen in our office on Monday and needs to be seen today for cardiac clearance.  Stated pt is having spine surgery on Tuesday.  Informed no available appts before Monday.  Lorriane Shire asked if pt could be worked in today.  Informed RN will discuss w/ MD and call back.  Lorriane Shire asked that MD call Dr. Clydene Laming back to discuss pt.  Informed Dr. Debara Pickett will be notified.  Verbalized understanding.  Message forwarded to Dr. Debara Pickett.  Callback number: [phone] ext [phone], [place].

## 2013-08-10 NOTE — Telephone Encounter (Signed)
Discussed with George Dickson at Dr. Marchelle Dickson office - unfortunately no appointments available today. He is scheduled to see an NP in our office on Monday. He may need additional testing prior to surgery. George Dickson has not followed up with Dr. Ellyn Hack (his cardiologist) or our office since 10/2011, after his STEMI and stent was placed.  I understand his surgery is elective, but scheduled for Tuesday next week.  Dr. Debara Pickett

## 2013-08-10 NOTE — Telephone Encounter (Signed)
Thank you. I agree  George Man, MD

## 2013-08-13 ENCOUNTER — Encounter (HOSPITAL_COMMUNITY): Payer: Self-pay | Admitting: Emergency Medicine

## 2013-08-13 ENCOUNTER — Encounter (HOSPITAL_COMMUNITY): Payer: Self-pay

## 2013-08-13 ENCOUNTER — Encounter (HOSPITAL_COMMUNITY)
Admission: RE | Admit: 2013-08-13 | Discharge: 2013-08-13 | Disposition: A | Discharge status: Home / Self Care | Payer: 59 | Source: Ambulatory Visit | Attending: Neurosurgery | Admitting: Neurosurgery

## 2013-08-13 ENCOUNTER — Inpatient Hospital Stay (HOSPITAL_COMMUNITY)
Admission: EM | Admit: 2013-08-13 | Discharge: 2013-08-15 | DRG: 639 | Disposition: A | Discharge status: Home / Self Care | Payer: 59 | Attending: Internal Medicine | Admitting: Internal Medicine

## 2013-08-13 ENCOUNTER — Encounter (HOSPITAL_COMMUNITY)
Admission: RE | Admit: 2013-08-13 | Discharge: 2013-08-13 | Disposition: A | Discharge status: Home / Self Care | Payer: 59 | Source: Ambulatory Visit | Attending: Anesthesiology | Admitting: Anesthesiology

## 2013-08-13 ENCOUNTER — Ambulatory Visit: Payer: 59 | Admitting: Cardiology

## 2013-08-13 ENCOUNTER — Other Ambulatory Visit (HOSPITAL_COMMUNITY): Payer: Self-pay | Admitting: *Deleted

## 2013-08-13 DIAGNOSIS — F411 Generalized anxiety disorder: Secondary | ICD-10-CM | POA: Diagnosis present

## 2013-08-13 DIAGNOSIS — E669 Obesity, unspecified: Secondary | ICD-10-CM | POA: Diagnosis present

## 2013-08-13 DIAGNOSIS — E66812 Obesity, class 2: Secondary | ICD-10-CM | POA: Diagnosis present

## 2013-08-13 DIAGNOSIS — I25119 Atherosclerotic heart disease of native coronary artery with unspecified angina pectoris: Secondary | ICD-10-CM | POA: Diagnosis present

## 2013-08-13 DIAGNOSIS — I10 Essential (primary) hypertension: Secondary | ICD-10-CM | POA: Diagnosis present

## 2013-08-13 DIAGNOSIS — Z955 Presence of coronary angioplasty implant and graft: Secondary | ICD-10-CM

## 2013-08-13 DIAGNOSIS — Z7982 Long term (current) use of aspirin: Secondary | ICD-10-CM

## 2013-08-13 DIAGNOSIS — I213 ST elevation (STEMI) myocardial infarction of unspecified site: Secondary | ICD-10-CM

## 2013-08-13 DIAGNOSIS — F17200 Nicotine dependence, unspecified, uncomplicated: Secondary | ICD-10-CM | POA: Diagnosis present

## 2013-08-13 DIAGNOSIS — E1101 Type 2 diabetes mellitus with hyperosmolarity with coma: Principal | ICD-10-CM | POA: Diagnosis present

## 2013-08-13 DIAGNOSIS — Z01812 Encounter for preprocedural laboratory examination: Secondary | ICD-10-CM

## 2013-08-13 DIAGNOSIS — Z01818 Encounter for other preprocedural examination: Secondary | ICD-10-CM

## 2013-08-13 DIAGNOSIS — K219 Gastro-esophageal reflux disease without esophagitis: Secondary | ICD-10-CM | POA: Diagnosis present

## 2013-08-13 DIAGNOSIS — E119 Type 2 diabetes mellitus without complications: Secondary | ICD-10-CM

## 2013-08-13 DIAGNOSIS — E118 Type 2 diabetes mellitus with unspecified complications: Secondary | ICD-10-CM | POA: Diagnosis present

## 2013-08-13 DIAGNOSIS — I2510 Atherosclerotic heart disease of native coronary artery without angina pectoris: Secondary | ICD-10-CM

## 2013-08-13 DIAGNOSIS — E785 Hyperlipidemia, unspecified: Secondary | ICD-10-CM | POA: Diagnosis present

## 2013-08-13 DIAGNOSIS — IMO0002 Reserved for concepts with insufficient information to code with codable children: Secondary | ICD-10-CM

## 2013-08-13 DIAGNOSIS — M5126 Other intervertebral disc displacement, lumbar region: Secondary | ICD-10-CM | POA: Diagnosis present

## 2013-08-13 DIAGNOSIS — Z720 Tobacco use: Secondary | ICD-10-CM

## 2013-08-13 DIAGNOSIS — Z01810 Encounter for preprocedural cardiovascular examination: Secondary | ICD-10-CM

## 2013-08-13 DIAGNOSIS — I219 Acute myocardial infarction, unspecified: Secondary | ICD-10-CM

## 2013-08-13 DIAGNOSIS — I252 Old myocardial infarction: Secondary | ICD-10-CM

## 2013-08-13 DIAGNOSIS — R7309 Other abnormal glucose: Secondary | ICD-10-CM

## 2013-08-13 DIAGNOSIS — Z79899 Other long term (current) drug therapy: Secondary | ICD-10-CM

## 2013-08-13 DIAGNOSIS — Z794 Long term (current) use of insulin: Secondary | ICD-10-CM | POA: Diagnosis present

## 2013-08-13 DIAGNOSIS — Z9861 Coronary angioplasty status: Secondary | ICD-10-CM

## 2013-08-13 DIAGNOSIS — R739 Hyperglycemia, unspecified: Secondary | ICD-10-CM | POA: Diagnosis present

## 2013-08-13 DIAGNOSIS — G4733 Obstructive sleep apnea (adult) (pediatric): Secondary | ICD-10-CM | POA: Diagnosis present

## 2013-08-13 DIAGNOSIS — Z6835 Body mass index (BMI) 35.0-35.9, adult: Secondary | ICD-10-CM

## 2013-08-13 HISTORY — DX: Headache: R51

## 2013-08-13 HISTORY — DX: Type 2 diabetes mellitus without complications: E11.9

## 2013-08-13 HISTORY — DX: Calculus of kidney: N20.0

## 2013-08-13 HISTORY — DX: Pneumonia, unspecified organism: J18.9

## 2013-08-13 HISTORY — DX: Gastro-esophageal reflux disease without esophagitis: K21.9

## 2013-08-13 HISTORY — DX: Anxiety disorder, unspecified: F41.9

## 2013-08-13 LAB — CBC WITH DIFFERENTIAL/PLATELET
BASOS PCT: 1 % (ref 0–1)
Basophils Absolute: 0.1 10*3/uL (ref 0.0–0.1)
EOS ABS: 0.1 10*3/uL (ref 0.0–0.7)
Eosinophils Relative: 1 % (ref 0–5)
HCT: 46.7 % (ref 39.0–52.0)
Hemoglobin: 16.9 g/dL (ref 13.0–17.0)
Lymphocytes Relative: 34 % (ref 12–46)
Lymphs Abs: 3.6 10*3/uL (ref 0.7–4.0)
MCH: 29 pg (ref 26.0–34.0)
MCHC: 36.2 g/dL — AB (ref 30.0–36.0)
MCV: 80.2 fL (ref 78.0–100.0)
Monocytes Absolute: 0.5 10*3/uL (ref 0.1–1.0)
Monocytes Relative: 4 % (ref 3–12)
NEUTROS PCT: 61 % (ref 43–77)
Neutro Abs: 6.4 10*3/uL (ref 1.7–7.7)
PLATELETS: 194 10*3/uL (ref 150–400)
RBC: 5.82 MIL/uL — ABNORMAL HIGH (ref 4.22–5.81)
RDW: 12.8 % (ref 11.5–15.5)
WBC: 10.6 10*3/uL — ABNORMAL HIGH (ref 4.0–10.5)

## 2013-08-13 LAB — GLUCOSE, CAPILLARY
GLUCOSE-CAPILLARY: 284 mg/dL — AB (ref 70–99)
GLUCOSE-CAPILLARY: 220 mg/dL — AB (ref 70–99)
GLUCOSE-CAPILLARY: 180 mg/dL — AB (ref 70–99)
Glucose-Capillary: 345 mg/dL — ABNORMAL HIGH (ref 70–99)
Glucose-Capillary: 295 mg/dL — ABNORMAL HIGH (ref 70–99)
Glucose-Capillary: 205 mg/dL — ABNORMAL HIGH (ref 70–99)
Glucose-Capillary: 170 mg/dL — ABNORMAL HIGH (ref 70–99)
Glucose-Capillary: 204 mg/dL — ABNORMAL HIGH (ref 70–99)
Glucose-Capillary: 172 mg/dL — ABNORMAL HIGH (ref 70–99)

## 2013-08-13 LAB — BASIC METABOLIC PANEL
BUN: 13 mg/dL (ref 6–23)
BUN: 13 mg/dL (ref 6–23)
BUN: 12 mg/dL (ref 6–23)
BUN: 11 mg/dL (ref 6–23)
BUN: 12 mg/dL (ref 6–23)
CALCIUM: 9.2 mg/dL (ref 8.4–10.5)
CALCIUM: 9.2 mg/dL (ref 8.4–10.5)
CO2: 24 mEq/L (ref 19–32)
CO2: 24 mEq/L (ref 19–32)
CO2: 24 mEq/L (ref 19–32)
CO2: 25 mEq/L (ref 19–32)
CO2: 24 mEq/L (ref 19–32)
CREATININE: 0.98 mg/dL (ref 0.50–1.35)
Calcium: 9.3 mg/dL (ref 8.4–10.5)
Calcium: 9.5 mg/dL (ref 8.4–10.5)
Calcium: 8.9 mg/dL (ref 8.4–10.5)
Chloride: 96 mEq/L (ref 96–112)
Chloride: 99 mEq/L (ref 96–112)
Chloride: 98 mEq/L (ref 96–112)
Chloride: 100 mEq/L (ref 96–112)
Chloride: 101 mEq/L (ref 96–112)
Creatinine, Ser: 1.04 mg/dL (ref 0.50–1.35)
Creatinine, Ser: 0.93 mg/dL (ref 0.50–1.35)
Creatinine, Ser: 0.93 mg/dL (ref 0.50–1.35)
Creatinine, Ser: 0.95 mg/dL (ref 0.50–1.35)
GFR calc Af Amer: >90 mL/min (ref >90)
GFR calc Af Amer: >90 mL/min (ref >90)
GFR calc non Af Amer: >90 mL/min (ref >90)
GFR, EST NON AFRICAN AMERICAN: 87 mL/min — AB (ref >90)
Glucose, Bld: 419 mg/dL — ABNORMAL HIGH (ref 70–99)
Glucose, Bld: 371 mg/dL — ABNORMAL HIGH (ref 70–99)
Glucose, Bld: 329 mg/dL — ABNORMAL HIGH (ref 70–99)
Glucose, Bld: 191 mg/dL — ABNORMAL HIGH (ref 70–99)
Glucose, Bld: 228 mg/dL — ABNORMAL HIGH (ref 70–99)
POTASSIUM: 3.8 meq/L (ref 3.7–5.3)
Potassium: 4.7 mEq/L (ref 3.7–5.3)
Potassium: 4.3 mEq/L (ref 3.7–5.3)
Potassium: 4.2 mEq/L (ref 3.7–5.3)
Potassium: 4 mEq/L (ref 3.7–5.3)
SODIUM: 134 meq/L — AB (ref 137–147)
SODIUM: 138 meq/L (ref 137–147)
SODIUM: 139 meq/L (ref 137–147)
Sodium: 136 mEq/L — ABNORMAL LOW (ref 137–147)
Sodium: 135 mEq/L — ABNORMAL LOW (ref 137–147)

## 2013-08-13 LAB — URINALYSIS, ROUTINE W REFLEX MICROSCOPIC
Bilirubin Urine: NEGATIVE
Glucose, UA: >1000 mg/dL — AB
Hgb urine dipstick: NEGATIVE
Ketones, ur: 15 mg/dL — AB
Leukocytes, UA: NEGATIVE
Nitrite: NEGATIVE
Protein, ur: NEGATIVE mg/dL
Specific Gravity, Urine: 1.038 — ABNORMAL HIGH (ref 1.005–1.030)
Urobilinogen, UA: 0.2 mg/dL (ref 0.0–1.0)
pH: 5.5 (ref 5.0–8.0)

## 2013-08-13 LAB — COMPREHENSIVE METABOLIC PANEL
ALK PHOS: 110 U/L (ref 39–117)
ALT: 26 U/L (ref 0–53)
AST: 19 U/L (ref 0–37)
Albumin: 4.2 g/dL (ref 3.5–5.2)
BILIRUBIN TOTAL: 0.7 mg/dL (ref 0.3–1.2)
BUN: 14 mg/dL (ref 6–23)
CO2: 18 mEq/L — ABNORMAL LOW (ref 19–32)
Calcium: 9.7 mg/dL (ref 8.4–10.5)
Chloride: 86 mEq/L — ABNORMAL LOW (ref 96–112)
Creatinine, Ser: 1.11 mg/dL (ref 0.50–1.35)
GFR calc Af Amer: >90 mL/min (ref >90)
GFR calc non Af Amer: 80 mL/min — ABNORMAL LOW (ref >90)
GLUCOSE: 791 mg/dL — AB (ref 70–99)
Potassium: 5.5 mEq/L — ABNORMAL HIGH (ref 3.7–5.3)
Sodium: 124 mEq/L — ABNORMAL LOW (ref 137–147)
TOTAL PROTEIN: 7.9 g/dL (ref 6.0–8.3)

## 2013-08-13 LAB — HEMOGLOBIN A1C
HEMOGLOBIN A1C: 12.9 % — AB (ref <5.7)
Mean Plasma Glucose: 324 mg/dL — ABNORMAL HIGH (ref <117)

## 2013-08-13 LAB — URINE MICROSCOPIC-ADD ON

## 2013-08-13 LAB — SURGICAL PCR SCREEN
MRSA, PCR: NEGATIVE
Staphylococcus aureus: NEGATIVE

## 2013-08-13 LAB — CBC
HCT: 43.4 % (ref 39.0–52.0)
Hemoglobin: 15.6 g/dL (ref 13.0–17.0)
MCH: 28.8 pg (ref 26.0–34.0)
MCHC: 35.9 g/dL (ref 30.0–36.0)
MCV: 80.1 fL (ref 78.0–100.0)
Platelets: 183 10*3/uL (ref 150–400)
RBC: 5.42 MIL/uL (ref 4.22–5.81)
RDW: 12.7 % (ref 11.5–15.5)
WBC: 9 10*3/uL (ref 4.0–10.5)

## 2013-08-13 LAB — CBG MONITORING, ED
Glucose-Capillary: 592 mg/dL (ref 70–99)
Glucose-Capillary: 433 mg/dL — ABNORMAL HIGH (ref 70–99)

## 2013-08-13 MED ORDER — SODIUM CHLORIDE 0.9 % IV SOLN
1000.0000 mL | Freq: Once | INTRAVENOUS | Status: AC
  Administered 2013-08-13: 1000 mL via INTRAVENOUS

## 2013-08-13 MED ORDER — ONDANSETRON HCL 4 MG PO TABS: 4.0000 mg | ORAL_TABLET | Freq: Four times a day (QID) | ORAL | Status: DC | PRN

## 2013-08-13 MED ORDER — ENOXAPARIN SODIUM 40 MG/0.4ML ~~LOC~~ SOLN
40.0000 mg | SUBCUTANEOUS | Status: DC
  Administered 2013-08-13 – 2013-08-15 (×3): 40 mg via SUBCUTANEOUS
  Filled 2013-08-13 (×3): qty 0.4

## 2013-08-13 MED ORDER — SODIUM CHLORIDE 0.9 % IJ SOLN: 3.0000 mL | Freq: Two times a day (BID) | INTRAMUSCULAR | Status: DC

## 2013-08-13 MED ORDER — SENNOSIDES-DOCUSATE SODIUM 8.6-50 MG PO TABS: 1.0000 | ORAL_TABLET | Freq: Every evening | ORAL | Status: DC | PRN

## 2013-08-13 MED ORDER — SODIUM CHLORIDE 0.9 % IV SOLN
INTRAVENOUS | Status: DC
  Administered 2013-08-13: 1000 mL via INTRAVENOUS

## 2013-08-13 MED ORDER — MORPHINE SULFATE 4 MG/ML IJ SOLN
4.0000 mg | Freq: Once | INTRAMUSCULAR | Status: AC
  Administered 2013-08-13: 4 mg via INTRAVENOUS
  Filled 2013-08-13: qty 1

## 2013-08-13 MED ORDER — ONDANSETRON HCL 4 MG/2ML IJ SOLN: 4.0000 mg | Freq: Four times a day (QID) | INTRAMUSCULAR | Status: DC | PRN

## 2013-08-13 MED ORDER — DEXTROSE 50 % IV SOLN: 25.0000 mL | INTRAVENOUS | Status: DC | PRN

## 2013-08-13 MED ORDER — DEXTROSE 5 % IV SOLN
3.0000 g | INTRAVENOUS | Status: AC
  Filled 2013-08-13: qty 3000

## 2013-08-13 MED ORDER — MORPHINE SULFATE 2 MG/ML IJ SOLN
2.0000 mg | INTRAMUSCULAR | Status: DC | PRN
  Administered 2013-08-13 – 2013-08-14 (×2): 4 mg via INTRAVENOUS
  Administered 2013-08-14: 2 mg via INTRAVENOUS
  Filled 2013-08-13: qty 2
  Filled 2013-08-13: qty 1
  Filled 2013-08-13: qty 2

## 2013-08-13 MED ORDER — DEXTROSE-NACL 5-0.45 % IV SOLN: INTRAVENOUS | Status: DC

## 2013-08-13 MED ORDER — METOPROLOL TARTRATE 25 MG PO TABS
25.0000 mg | ORAL_TABLET | Freq: Every day | ORAL | Status: DC
  Administered 2013-08-13 – 2013-08-15 (×3): 25 mg via ORAL
  Filled 2013-08-13 (×3): qty 1

## 2013-08-13 MED ORDER — HYDROCODONE-ACETAMINOPHEN 5-325 MG PO TABS
1.0000 | ORAL_TABLET | Freq: Four times a day (QID) | ORAL | Status: DC | PRN
  Administered 2013-08-14 – 2013-08-15 (×2): 2 via ORAL
  Filled 2013-08-13 (×2): qty 2

## 2013-08-13 MED ORDER — DEXAMETHASONE SODIUM PHOSPHATE 10 MG/ML IJ SOLN: 10.0000 mg | INTRAMUSCULAR | Status: AC

## 2013-08-13 MED ORDER — DOCUSATE SODIUM 100 MG PO CAPS
100.0000 mg | ORAL_CAPSULE | Freq: Two times a day (BID) | ORAL | Status: DC
  Administered 2013-08-13 – 2013-08-15 (×5): 100 mg via ORAL
  Filled 2013-08-13 (×6): qty 1

## 2013-08-13 MED ORDER — HEPARIN SODIUM (PORCINE) 5000 UNIT/ML IJ SOLN
5000.0000 [IU] | Freq: Three times a day (TID) | INTRAMUSCULAR | Status: DC
  Filled 2013-08-13 (×3): qty 1

## 2013-08-13 MED ORDER — ATORVASTATIN CALCIUM 40 MG PO TABS
40.0000 mg | ORAL_TABLET | Freq: Every day | ORAL | Status: DC
  Administered 2013-08-13 – 2013-08-15 (×3): 40 mg via ORAL
  Filled 2013-08-13 (×3): qty 1

## 2013-08-13 MED ORDER — ACETAMINOPHEN 325 MG PO TABS: 650.0000 mg | ORAL_TABLET | Freq: Four times a day (QID) | ORAL | Status: DC | PRN

## 2013-08-13 MED ORDER — SODIUM CHLORIDE 0.9 % IV SOLN
1000.0000 mL | INTRAVENOUS | Status: DC
  Administered 2013-08-13 (×2): 1000 mL via INTRAVENOUS

## 2013-08-13 MED ORDER — METAXALONE 800 MG PO TABS
800.0000 mg | ORAL_TABLET | Freq: Two times a day (BID) | ORAL | Status: DC
  Administered 2013-08-13 – 2013-08-15 (×5): 800 mg via ORAL
  Filled 2013-08-13 (×7): qty 1

## 2013-08-13 MED ORDER — ALUM & MAG HYDROXIDE-SIMETH 200-200-20 MG/5ML PO SUSP: 30.0000 mL | Freq: Four times a day (QID) | ORAL | Status: DC | PRN

## 2013-08-13 MED ORDER — ACETAMINOPHEN 650 MG RE SUPP: 650.0000 mg | Freq: Four times a day (QID) | RECTAL | Status: DC | PRN

## 2013-08-13 MED ORDER — SODIUM CHLORIDE 0.9 % IV SOLN
INTRAVENOUS | Status: DC
  Administered 2013-08-13: 4.4 [IU]/h via INTRAVENOUS
  Administered 2013-08-13: 4.5 [IU]/h via INTRAVENOUS
  Administered 2013-08-13: 7.1 [IU]/h via INTRAVENOUS
  Administered 2013-08-13: 5.3 [IU]/h via INTRAVENOUS
  Administered 2013-08-13: 3.3 [IU]/h via INTRAVENOUS
  Filled 2013-08-13: qty 1

## 2013-08-13 MED ORDER — SODIUM CHLORIDE 0.9 % IV SOLN
INTRAVENOUS | Status: DC
  Administered 2013-08-14: 03:00:00 via INTRAVENOUS
  Administered 2013-08-14: 6.4 [IU]/h via INTRAVENOUS
  Filled 2013-08-13: qty 1

## 2013-08-13 MED ORDER — SODIUM CHLORIDE 0.9 % IV BOLUS (SEPSIS)
1000.0000 mL | Freq: Once | INTRAVENOUS | Status: AC
  Administered 2013-08-13: 1000 mL via INTRAVENOUS

## 2013-08-13 MED ORDER — SODIUM CHLORIDE 0.9 % IV SOLN: INTRAVENOUS | Status: AC

## 2013-08-13 NOTE — Progress Notes (Signed)
NURSING PROGRESS NOTE  George Dickson [id number] Admission Data: 08/13/2013 2:14 PM Attending Provider: Verlee Monte, MD DTO:[id number],[email or web address] D, MD Code Status:Full  George Dickson is a 43 y.o. male patient admitted from ED:  -No acute distress noted.  -No complaints of shortness of breath.  -No complaints of chest pain.   Cardiac Monitoring: Box # [id number] in place. Cardiac monitor yields:normal sinus rhythm.  Blood pressure 122/78, pulse 91, temperature 98.1 F (36.7 C), temperature source Oral, resp. rate 17, SpO2 98.00%.   IV Fluids:  IV in place, occlusive dsg intact without redness, IV cath antecubital left, condition patent and no redness normal saline @125 .   Allergies:  Review of patient's allergies indicates no known allergies.  Past Medical History:   has a past medical history of No pertinent past medical history; Stented coronary artery; Hypertension; Coronary artery disease (JUNE 2013); OSA (obstructive sleep apnea) (12/06/2011); Dyslipidemia; Pneumonia; Anxiety; Kidney stones; GERD (gastroesophageal reflux disease); and Headache(784.0).  Past Surgical History:   has past surgical history that includes Cardiac catheterization (JUNE 1,2013); Coronary angioplasty (10/23/2011); doppler echocardiography (10/25/2011); and Shoulder arthroscopy (Right, 1992).  Social History:   reports that he has been smoking Cigars and Cigarettes.  He has a 6 pack-year smoking history. He has never used smokeless tobacco. He reports that he drinks about 14.4 ounces of alcohol per week. He reports that he does not use illicit drugs.  Skin: warm dry intact   Patient/Family orientated to room. Information packet given to patient/family. Admission inpatient armband information verified with patient/family to include name and date of birth and placed on patient arm. Side rails up x 2, fall assessment and education completed with patient/family. Patient/family able to verbalize understanding of risk associated  with falls and verbalized understanding to call for assistance before getting out of bed. Call light within reach. Patient/family able to voice and demonstrate understanding of unit orientation instructions.    Will continue to evaluate and treat per MD orders.

## 2013-08-13 NOTE — ED Provider Notes (Signed)
CSN: [id number]     Arrival date & time 08/13/13  1478 History   First MD Initiated Contact with Patient 08/13/13 1016     Chief Complaint  Patient presents with  . Hyperglycemia     (Consider location/radiation/quality/duration/timing/severity/associated sxs/prior Treatment) HPI Comments: Pt was at short stay today and had routine pre-op labs done, sent here due to high blood sugar >700.  Pt was getting pre-op for low back surgery.  Pt's PCP is Dr. Hassell Done.  Pt's mother has diabetes.  He was told at a recent PCP office visit that his blood sugar was borderline, to watch his diet and it could be rechecked later on.  He was seen in the ED on 3/6 and prescribed about 1 week of steroids he recalls.  He endorses polyuria, polydipsia and feeling tired. Per RN, he reportedly drinks a lot of fruit juice daily, up to 3 gallons.     The history is provided by the patient, a relative and medical records.    Past Medical History  Diagnosis Date  . No pertinent past medical history   . Stented coronary artery   . Hypertension   . Coronary artery disease JUNE 2013    inferior ST-segment  elevation  MI- DES stent -proximaL RCA  . OSA (obstructive sleep apnea) 12/06/2011    initial sleep study  completed, CPAP /BiPAP TITRATION DONE 12/20/11  . Dyslipidemia   . Pneumonia   . Anxiety   . Kidney stones   . GERD (gastroesophageal reflux disease)     occasionally takes TUMS  . GNFAOZHY(865.7)    Past Surgical History  Procedure Laterality Date  . Cardiac catheterization  JUNE 1,2013    thrombectomy of 100% thrombotic  occlusion of  proximal RCA  . Coronary angioplasty  10/23/2011    PCI - proximal  RCA with  Promus ElEMENT  des 3.0 mm x 36mm ; final diameter 3.77mm distal, 3.35 mm proximal   . Doppler echocardiography  10/25/2011    EF 84-69%;GE diastolic function normal ,systolic function normal  . Shoulder arthroscopy Right 1992   Family History  Problem Relation Age of Onset  . Coronary artery  disease Mother   . Hypertension Mother   . Diabetes type II Mother    History  Substance Use Topics  . Smoking status: Current Every Day Smoker -- 1.50 packs/day for 4 years    Types: Cigars, Cigarettes  . Smokeless tobacco: Never Used     Comment: trying to quit - has not had any cigarettes or cigars for 3 weeks  . Alcohol Use: 14.4 oz/week    24 Cans of beer per week     Comment: none in past 3 months. Heavy drinker in the past    Review of Systems  Constitutional: Positive for fatigue. Negative for fever.  Respiratory: Positive for cough. Negative for shortness of breath and wheezing.   Cardiovascular: Negative for chest pain.  Gastrointestinal: Negative for nausea, vomiting and diarrhea.  Endocrine: Positive for polydipsia and polyuria.  Genitourinary: Positive for frequency.  All other systems reviewed and are negative.      Allergies  Review of patient's allergies indicates no known allergies.  Home Medications   Current Outpatient Rx  Name  Route  Sig  Dispense  Refill  . atorvastatin (LIPITOR) 40 MG tablet   Oral   Take 40 mg by mouth daily.         . clopidogrel (PLAVIX) 75 MG tablet   Oral  Take 75 mg by mouth daily.         Marland Kitchen HYDROcodone-acetaminophen (NORCO/VICODIN) 5-325 MG per tablet   Oral   Take 1-2 tablets by mouth every 6 (six) hours as needed for moderate pain.         Marland Kitchen HYDROmorphone (DILAUDID) 2 MG tablet   Oral   Take 2 mg by mouth every 4 (four) hours as needed for severe pain (may take 1 to 2 tabs every 4 hours as needed).         Marland Kitchen ibuprofen (ADVIL,MOTRIN) 200 MG tablet   Oral   Take 800 mg by mouth every 6 (six) hours as needed for mild pain.          . metaxalone (SKELAXIN) 800 MG tablet   Oral   Take 800 mg by mouth 2 (two) times daily.         . metoprolol tartrate (LOPRESSOR) 25 MG tablet   Oral   Take 25 mg by mouth daily.         . predniSONE (DELTASONE) 20 MG tablet   Oral   Take 2 tablets (40 mg total)  by mouth daily.   10 tablet   0    BP 122/89  Pulse 105  Temp(Src) 98.1 F (36.7 C) (Oral)  Resp 18  SpO2 98% Physical Exam  Nursing note and vitals reviewed. Constitutional: He is oriented to person, place, and time. He appears well-developed and well-nourished. No distress.  HENT:  Head: Normocephalic and atraumatic.  Eyes: Conjunctivae are normal. No scleral icterus.  Neck: Normal range of motion. Neck supple.  Cardiovascular: Regular rhythm and intact distal pulses.  Tachycardia present.   No murmur heard. Pulmonary/Chest: Effort normal. No respiratory distress. He has no wheezes.  Abdominal: Soft. He exhibits no distension. There is no tenderness.  Musculoskeletal:       Lumbar back: He exhibits tenderness and pain. He exhibits no swelling, no edema and no spasm.  Neurological: He is alert and oriented to person, place, and time. He exhibits normal muscle tone. Coordination normal.  Skin: Skin is warm and dry. He is not diaphoretic.  Psychiatric: He has a normal mood and affect.    ED Course  Procedures (including critical care time) Labs Review Labs Reviewed - No data to display Imaging Review Dg Chest 2 View  08/13/2013   CLINICAL DATA:  Preop for lumbar spine surgery.  EXAM: CHEST  2 VIEW  COMPARISON:  04/17/2013.  FINDINGS: The cardiac silhouette, mediastinal and hilar contours are within normal limits and stable. A coronary artery stent is noted. Minimal streaky scarring changes but no infiltrates, edema or effusions. The bony thorax is intact.  IMPRESSION: No acute cardiopulmonary findings.   Electronically Signed   By: Kalman Jewels M.D.   On: 08/13/2013 10:06     EKG Interpretation None      RA sat is 98% and I interpret to be adequate  MDM   Final diagnoses:  Acute hyperglycemia    Pt with family h/o DM, was told recently he was pre-diabetic and drinks a lot of sugar apparently.  And he was give a steroid pack about 2.5 weeks ago that he took for 1  week . 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  Anion gap after review of labs is 20 suggesting early DKA.  Pt's mental status is normal, will give IVF's, 2 L and start IV insulin and pt will need admission.  Saddie Benders. Shelita Steptoe, MD 08/13/13 [phone]

## 2013-08-13 NOTE — Progress Notes (Signed)
Spoke with Lenna Sciara, RN, charge nurse in the ED at 9:10 AM. Told her that pt would be coming to the ED due to elevated blood sugar, new onset. She requested that I bring pt to triage and she would have a room waiting for him. After pt had labs, ekg and cxr done here in PAT, pt was taken to triage in the ED via wheelchair and with his mother. Pt voiced understanding as to why he was having to go to the ED.

## 2013-08-13 NOTE — H&P (Signed)
[facility]                                                                                    Patient Demographics  George Dickson, is a 43 y.o. male  MRN: [id number]   DOB - [birth year]  Admit Date - 08/13/2013  Outpatient Primary MD for the patient is Ron Parker, MD   With History of -  Past Medical History  Diagnosis Date  . Stented coronary artery   . Hypertension   . Coronary artery disease JUNE 2013    inferior ST-segment  elevation  MI- DES stent -proximaL RCA  . OSA (obstructive sleep apnea) 12/06/2011    initial sleep study  completed, CPAP /BiPAP TITRATION DONE 12/20/11  . Dyslipidemia   . Pneumonia   . Anxiety   . Kidney stones   . GERD (gastroesophageal reflux disease)     occasionally takes TUMS  . YPPJKDTO(671.2)       Past Surgical History  Procedure Laterality Date  . Cardiac catheterization  JUNE 1,2013    thrombectomy of 100% thrombotic  occlusion of  proximal RCA  . Coronary angioplasty  10/23/2011    PCI - proximal  RCA with  Promus ElEMENT  des 3.0 mm x 75mm ; final diameter 3.35mm distal, 3.35 mm proximal   . Doppler echocardiography  10/25/2011    EF 45-80%;DX diastolic function normal ,systolic function normal  . Shoulder arthroscopy Right 1992    in for   Chief Complaint  Patient presents with  . Hyperglycemia     HPI  George Dickson  is a 43 y.o. male with a history of CAD, s/p STEMI in June 2013, who presented to the ED after having a blood glucose reading of 791 at a pre-surgical office appointment. Patient stated "I feel fine". Patient stated he has recently had steroids for back pain and has been drinking a lot of juice at home (2-3 gallons/day) for the last 2 weeks. Patient complaint of back pain only due to "ruptured disc that is compressing a nerve" for which he was scheduled to have surgery with Dr. Annette Stable tomorrow. Pain is only on R side of back and shoots down right leg. He has had increase in thirst, decrease in appetite, increase  in frequency of urination, and dry mouth. He's had moderate constipation the past 2 weeks, last BM was 3 days ago. He reports a recent 10-15 lb weight loss. Last blood glucose check was 3 months ago when PCP checked and he reported "they were high but not high enough to start medication."    Review of Systems    In addition to the HPI above,  No Fever-chills, No Headache, No changes with Vision or hearing, No problems swallowing food or Liquids, No Chest pain, Cough or Shortness of Breath, No Abdominal pain, No Nausea or Vommitting,  No Blood in stool or Urine, No dysuria, No new skin rashes or bruises, No new weakness, tingling, numbness in any extremity, No significant Mental Stressors.  A full 10 point Review of Systems was done, except as stated above, all other Review of Systems were negative.   Social History History  Substance  Use Topics  . Smoking status:  Every Day Smoker until 3 weeks ago-- 1.50 packs/day for 4 years    Types: Cigars, Cigarettes  . Smokeless tobacco: Never Used     Comment: trying to quit - has not had any cigarettes or cigars for 3 weeks  . Alcohol Use: 14.4 oz/week    24 Cans of beer per week     Comment: none in past 3 months. Heavy drinker in the past     Family History Family History  Problem Relation Age of Onset  . Coronary artery disease Mother   . Hypertension Mother   . Diabetes type II Mother      Prior to Admission medications   Medication Sig Start Date End Date Taking? Authorizing Provider  atorvastatin (LIPITOR) 40 MG tablet Take 40 mg by mouth daily. 07/27/13  Yes Historical Provider, MD  clopidogrel (PLAVIX) 75 MG tablet Take 75 mg by mouth daily. 07/27/13  Yes Historical Provider, MD  HYDROcodone-acetaminophen (NORCO/VICODIN) 5-325 MG per tablet Take 1-2 tablets by mouth every 6 (six) hours as needed for moderate pain.   Yes Historical Provider, MD  HYDROmorphone (DILAUDID) 2 MG tablet Take 2 mg by mouth every 4 (four) hours as  needed for severe pain (may take 1 to 2 tabs every 4 hours as needed).   Yes Historical Provider, MD  ibuprofen (ADVIL,MOTRIN) 200 MG tablet Take 800 mg by mouth every 6 (six) hours as needed for mild pain.    Yes Historical Provider, MD  metaxalone (SKELAXIN) 800 MG tablet Take 800 mg by mouth 2 (two) times daily. 07/30/13  Yes Historical Provider, MD  metoprolol tartrate (LOPRESSOR) 25 MG tablet Take 25 mg by mouth daily. 07/27/13  Yes Historical Provider, MD  predniSONE (DELTASONE) 20 MG tablet Take 2 tablets (40 mg total) by mouth daily. 07/27/13   Montine Circle, PA-C    No Known Allergies  Physical Exam  Vitals  Blood pressure 143/93, pulse 104, temperature 98.1 F (36.7 C), temperature source Oral, resp. rate 18, height 6\' 3"  (1.905 m), weight 128.277 kg (282 lb 12.8 oz), SpO2 98.00%.   General:  lying in bed in moderate pain due to back,   Psych:  Normal affect and insight, Not Suicidal or Homicidal, Awake Alert, Oriented X 3.  Neuro:   No F.N deficits, ALL C.Nerves Intact, Strength 5/5 all 4 extremities, Sensation intact all 4 extremities.  ENT:  Ears and Eyes appear Normal, Conjunctivae clear, PERRLA. Dry oral mucosa.  Neck:  Supple Neck, No JVD, No cervical lymphadenopathy appriciated.  Respiratory:  Symmetrical Chest wall movement, Good air movement bilaterally, CTAB.  Cardiac:  Regular rhythm but tachycardic, No Gallops, Rubs or Murmurs, No Parasternal Heave.  Abdomen:  Positive Bowel Sounds, Abdomen Soft, Non tender, No organomegaly appriciated  Skin:  No Cyanosis, Normal Skin Turgor, No Skin Rash or Bruise.  Extremities:  Good muscle tone,  joints appear normal , no effusions, Normal ROM.   Data Review  CBC  Recent Labs Lab 08/13/13 0855 08/13/13 1445  WBC 10.6* 9.0  HGB 16.9 15.6  HCT 46.7 43.4  PLT 194 183  MCV 80.2 80.1  MCH 29.0 28.8  MCHC 36.2* 35.9  RDW 12.8 12.7  LYMPHSABS 3.6  --   MONOABS 0.5  --   EOSABS 0.1  --   BASOSABS 0.1  --     ------------------------------------------------------------------------------------------------------------------  Chemistries   Recent Labs Lab 08/13/13 0855 08/13/13 1340  NA 124* 134*  K 5.5* 4.7  CL 86* 96  CO2 18* 24  GLUCOSE 791* 419*  BUN 14 13  CREATININE 1.11 1.04  CALCIUM 9.7 9.3  AST 19  --   ALT 26  --   ALKPHOS 110  --   BILITOT 0.7  --    ------------------------------------------------------------------------------------------------------------------   ---------------------------------------------------------------------------------------------------------------  Urinalysis    Component Value Date/Time   COLORURINE YELLOW 08/13/2013 1143   APPEARANCEUR CLEAR 08/13/2013 1143   LABSPEC 1.038* 08/13/2013 1143   PHURINE 5.5 08/13/2013 1143   GLUCOSEU >1000* 08/13/2013 1143   HGBUR NEGATIVE 08/13/2013 1143   BILIRUBINUR NEGATIVE 08/13/2013 1143   KETONESUR 15* 08/13/2013 1143   PROTEINUR NEGATIVE 08/13/2013 1143   UROBILINOGEN 0.2 08/13/2013 1143   NITRITE NEGATIVE 08/13/2013 1143   LEUKOCYTESUR NEGATIVE 08/13/2013 1143    ----------------------------------------------------------------------------------------------------------------  Imaging results:   Dg Eye Foreign Body  08/05/2013   CLINICAL DATA:  Metal working/exposure; clearance prior to MRI  EXAM: ORBITS FOR FOREIGN BODY - 2 VIEW  COMPARISON:  None.  FINDINGS: There is no evidence of metallic foreign body within the orbits. No significant bone abnormality identified.  IMPRESSION: No evidence of metallic foreign body within the orbits.   Electronically Signed   By: Rozetta Nunnery M.D.   On: 08/05/2013 16:32   Dg Chest 2 View  08/13/2013   CLINICAL DATA:  Preop for lumbar spine surgery.  EXAM: CHEST  2 VIEW  COMPARISON:  04/17/2013.  FINDINGS: The cardiac silhouette, mediastinal and hilar contours are within normal limits and stable. A coronary artery stent is noted. Minimal streaky scarring changes but no  infiltrates, edema or effusions. The bony thorax is intact.  IMPRESSION: No acute cardiopulmonary findings.   Electronically Signed   By: Kalman Jewels M.D.   On: 08/13/2013 10:06   Mr Lumbar Spine Wo Contrast  08/05/2013   CLINICAL DATA:  Severe right leg pain and burning  EXAM: MRI LUMBAR SPINE WITHOUT CONTRAST  TECHNIQUE: Multiplanar, multisequence MR imaging was performed. No intravenous contrast was administered.  COMPARISON:  None available.  FINDINGS: For the purposes of this dictation, the lowest well-formed intervertebral disc spaces presumed to be the L5-S1 level, and there presumed to be 5 lumbar type vertebral bodies.  Study is somewhat limited by motion artifact. An addition, no STIR sequences available for review at time of dictation.  The vertebral bodies are normally aligned with preservation of the normal lumbar lordosis. Vertebral body heights are preserved. Signal intensity within the vertebral body bone marrow and visualized spinal cord is within normal limits. The conus medullaris terminates at the L1 level.  At T12-L1, seen only on sagittal projection, there is no disc bulge or disc protrusion. The intervertebral disc is well hydrated. No facet arthrosis. No canal or neural foraminal stenosis.  At L1-2, there is no disc bulge or disc protrusion. Intervertebral disc is well hydrated. No significant facet arthrosis identified. No canal or neural foraminal stenosis.  At L2-3, there is no disc bulge or disc protrusion. The intervertebral disc is well hydrated. Mild bilateral facet hypertrophy is likely present, although evaluation somewhat limited due to motion artifact. No canal or neural foraminal stenosis.  At L3-4, there is no disc bulge or disc protrusion. Intervertebral disc is well hydrated. Mild bilateral facet hypertrophy is present, left slightly greater than right. No canal or neural foraminal stenosis.  At L4-5, there is diffuse disc bulge with disc desiccation and mild bilateral  facet hypertrophy. Associated Modic endplate changes seen about the L4-5 intervertebral disc space. A  superimposed right central/foraminal disc protrusion is present, indenting the right ventral thecal sac and encroaching upon the right lateral recess (series 7, image 25). There is a sequestered disc fragment located approximately 4 mm inferior to the herniated L4-5 parent disc, just posterior to the mid right L5 vertebral body. The [place] fragment measures 1.0 x 1.5 x 1.3 cm, and appears to lie a subligamentous location (series 7, image 27). There is secondary impingement of the right L4 and L5 nerve roots at this level. In addition, the Mamone fragment may abut the transiting S1 nerve root as well. There is mild canal with severe right and moderate left foraminal stenosis.  At L5-S1, there is diffuse disc bulge with disc desiccation. Small superimposed central disc protrusion is present. There is an associated annular fissure posteriorly (series 10, image 6). Mild bilateral facet hypertrophy. No canal stenosis. Moderate bilateral foraminal narrowing.  The limited evaluation of the paraspinous musculature is grossly within normal limits. Visualized visceral structures are unremarkable.  IMPRESSION: 1. Right central/foraminal disc herniation at L4-5 with associated sequestered disc fragment posterior to the L5 vertebral body with secondary impingement upon the right L4 and L5 nerve roots. There is severe right with moderate left foraminal narrowing at the L4-5 level. 2. Degenerative disc bulging with superimposed small central disc protrusion and associated annular fissure at L5-S1. There is resultant moderate bilateral foraminal stenosis, right slightly worse than left.   Electronically Signed   By: Jeannine Boga M.D.   On: 08/05/2013 21:44    My personal review of EKG: Rhythm NSR, Tachycardic, Rate  103 /min , no Acute ST changes    Assessment & Plan  Active Problems: Hyperglycemia/Hyperosmolar  nonketotic state. -CBG of 791 on admission -IV fluids, IV insulin, glucose stabilizer. -Anion gap of 20 on admission.  -Continue to monitor CBG and BMET.  Back pain -2/2 to ruptured disc. -neuro surgery rescheduled until Friday. -Continue IV morphine prn q 3 hours, hydromorphone, tylenol, and Metaxalone.   Coronary Artery Disease -STEMI in June 2013 with stent placement. -Continue Lopressor, cards asked to see prior to surgery.  Dyslipidemia -Stable.  -Continue atorvastatin.   GERD -stable -Continue Maalox/Mylanta suspension  DVT Prophylaxis: Lovenox AM Labs Ordered, also please review Full Orders Family Communication:  Mother at bedside.   Code Status: full  Likely DC to  home Condition:  stable Time spent in minutes : 9    Mixon, Lucila Maine PA-S on 08/13/2013 at 3:52 PM Imogene Burn, PA-C  Between 7am to 7pm - Pager - [phone] After 7pm go to [email or web address] - password [email or web address] And look for the night coverage person covering me after hours  [place]  [phone]   Addendum  Patient seen and examined, chart and data base reviewed.  I agree with the above assessment and plan.  For full details please see Mrs. Imogene Burn PA note.  Patient found to have blood glucose of 791 on preoperative blood work.  Patient doesn't have a lot of symptoms except polydipsia and polyuria.  Started on glipizide nebulizer, hyperosmolar hyperglycemic state, likely will get off of the drip tonight.   Birdie Hopes, MD [facility] Pager: [phone] 08/13/2013, 4:03 PM

## 2013-08-13 NOTE — Consult Note (Signed)
Reason for Consult: Pre-operative Clearance for Elective Back Surgery Referring Physician: Dr. Earnie Larsson Primary Cardiologist: Dr. Ellyn Hack PCP: Dr. Hassell Done  HPI: The patient is a 43 y/o AAM with known CAD, s/p STEMI in June 2013, found to have a total RCA occlusion, treated with PCI utilizing a DES. He had no other significant CAD at that time. LVF was normal by post MI echo, measured at 60-65% w/o WMA. He was initially placed on DAPT with ASA + Effient, however he prematurely self-discontinued his Effient, w/o notifying Dr. Ellyn Hack, ~ 4 months after his STEMI, due to cost. His PCP, Dr. Hassell Done, recently placed him on Plavix 4 months ago and he now reports daily compliance will all of his meds.  He has not been seen in our office since 2013. His other PMH is significant for obesity, newly diagnosed DM, HTN, DLD and chronic tobacco abuse, but reports that he recently quit smoking 3 weeks ago. He has OSA but is not compliant w/ CPAP.   He is scheduled to undergo a lumbar laminectomy/decopression, tomorrow, by Dr. Annette Stable. He arrived at short stay today for rotuein preoperative labs and was found to have severely elevated blood glucose level of 791. He was admitted and placed on an insulin drip. Cardiology was consulted for preoperative clearance.   Since his MI in 2013, he has denied any chest pain, pressure, or tightness. He admits to being sedentary due to his back pain and endorses a low exercise tolerance. He states that he becomes very short of breath walking short distances. He denies resting dyspnea, orthopnea, PND and LEE. Occasionally he feels palpitations, but denies dizziness, syncope/ near syncope.     Past Medical History  Diagnosis Date  . No pertinent past medical history   . Stented coronary artery   . Hypertension   . Coronary artery disease JUNE 2013    inferior ST-segment  elevation  MI- DES stent -proximaL RCA  . OSA (obstructive sleep apnea) 12/06/2011    initial sleep study   completed, CPAP /BiPAP TITRATION DONE 12/20/11  . Dyslipidemia   . Pneumonia   . Anxiety   . Kidney stones   . GERD (gastroesophageal reflux disease)     occasionally takes TUMS  . LFYBOFBP(102.5)     Past Surgical History  Procedure Laterality Date  . Cardiac catheterization  JUNE 1,2013    thrombectomy of 100% thrombotic  occlusion of  proximal RCA  . Coronary angioplasty  10/23/2011    PCI - proximal  RCA with  Promus ElEMENT  des 3.0 mm x 67m ; final diameter 3.215mdistal, 3.35 mm proximal   . Doppler echocardiography  10/25/2011    EF 6085-27%;POiastolic function normal ,systolic function normal  . Shoulder arthroscopy Right 1992    Family History  Problem Relation Age of Onset  . Coronary artery disease Mother   . Hypertension Mother   . Diabetes type II Mother     Social History:  reports that he has been smoking Cigars and Cigarettes.  He has a 6 pack-year smoking history. He has never used smokeless tobacco. He reports that he drinks about 14.4 ounces of alcohol per week. He reports that he does not use illicit drugs.  Allergies: No Known Allergies  Medications:  Prior to Admission medications   Medication Sig Start Date End Date Taking? Authorizing Provider  atorvastatin (LIPITOR) 40 MG tablet Take 40 mg by mouth daily. 07/27/13  Yes Historical Provider, MD  clopidogrel (PLAVIX) 75 MG tablet  Take 75 mg by mouth daily. 07/27/13  Yes Historical Provider, MD  HYDROcodone-acetaminophen (NORCO/VICODIN) 5-325 MG per tablet Take 1-2 tablets by mouth every 6 (six) hours as needed for moderate pain.   Yes Historical Provider, MD  HYDROmorphone (DILAUDID) 2 MG tablet Take 2 mg by mouth every 4 (four) hours as needed for severe pain (may take 1 to 2 tabs every 4 hours as needed).   Yes Historical Provider, MD  ibuprofen (ADVIL,MOTRIN) 200 MG tablet Take 800 mg by mouth every 6 (six) hours as needed for mild pain.    Yes Historical Provider, MD  metaxalone (SKELAXIN) 800 MG tablet Take  800 mg by mouth 2 (two) times daily. 07/30/13  Yes Historical Provider, MD  metoprolol tartrate (LOPRESSOR) 25 MG tablet Take 25 mg by mouth daily. 07/27/13  Yes Historical Provider, MD  predniSONE (DELTASONE) 20 MG tablet Take 2 tablets (40 mg total) by mouth daily. 07/27/13   Montine Circle, PA-C     Results for orders placed during the hospital encounter of 08/13/13 (from the past 48 hour(s))  CBG MONITORING, ED     Status: Abnormal   Collection Time    08/13/13 11:40 AM      Result Value Ref Range   Glucose-Capillary 592 (*) 70 - 99 mg/dL   Comment 1 Documented in Chart    URINALYSIS, ROUTINE W REFLEX MICROSCOPIC     Status: Abnormal   Collection Time    08/13/13 11:43 AM      Result Value Ref Range   Color, Urine YELLOW  YELLOW   APPearance CLEAR  CLEAR   Specific Gravity, Urine 1.038 (*) 1.005 - 1.030   pH 5.5  5.0 - 8.0   Glucose, UA >1000 (*) NEGATIVE mg/dL   Hgb urine dipstick NEGATIVE  NEGATIVE   Bilirubin Urine NEGATIVE  NEGATIVE   Ketones, ur 15 (*) NEGATIVE mg/dL   Protein, ur NEGATIVE  NEGATIVE mg/dL   Urobilinogen, UA 0.2  0.0 - 1.0 mg/dL   Nitrite NEGATIVE  NEGATIVE   Leukocytes, UA NEGATIVE  NEGATIVE  URINE MICROSCOPIC-ADD ON     Status: None   Collection Time    08/13/13 11:43 AM      Result Value Ref Range   Squamous Epithelial / LPF RARE  RARE   WBC, UA 0-2  <3 WBC/hpf   RBC / HPF 0-2  <3 RBC/hpf   Bacteria, UA RARE  RARE  CBG MONITORING, ED     Status: Abnormal   Collection Time    08/13/13 12:52 PM      Result Value Ref Range   Glucose-Capillary 433 (*) 70 - 99 mg/dL  BASIC METABOLIC PANEL     Status: Abnormal   Collection Time    08/13/13  1:40 PM      Result Value Ref Range   Sodium 134 (*) 137 - 147 mEq/L   Potassium 4.7  3.7 - 5.3 mEq/L   Chloride 96  96 - 112 mEq/L   CO2 24  19 - 32 mEq/L   Glucose, Bld 419 (*) 70 - 99 mg/dL   BUN 13  6 - 23 mg/dL   Creatinine, Ser 1.04  0.50 - 1.35 mg/dL   Calcium 9.3  8.4 - 10.5 mg/dL   GFR calc non Af  Amer 87 (*) >90 mL/min   GFR calc Af Amer >90  >90 mL/min   Comment: (NOTE)     The eGFR has been calculated using the CKD EPI equation.  This calculation has not been validated in all clinical situations.     eGFR's persistently <90 mL/min signify possible Chronic Kidney     Disease.  GLUCOSE, CAPILLARY     Status: Abnormal   Collection Time    08/13/13  2:11 PM      Result Value Ref Range   Glucose-Capillary 345 (*) 70 - 99 mg/dL    Dg Chest 2 View  08/13/2013   CLINICAL DATA:  Preop for lumbar spine surgery.  EXAM: CHEST  2 VIEW  COMPARISON:  04/17/2013.  FINDINGS: The cardiac silhouette, mediastinal and hilar contours are within normal limits and stable. A coronary artery stent is noted. Minimal streaky scarring changes but no infiltrates, edema or effusions. The bony thorax is intact.  IMPRESSION: No acute cardiopulmonary findings.   Electronically Signed   By: Kalman Jewels M.D.   On: 08/13/2013 10:06    Review of Systems  Respiratory: Positive for shortness of breath (exertional).   Cardiovascular: Positive for palpitations (ocassional ). Negative for chest pain, orthopnea, leg swelling and PND.  Musculoskeletal: Positive for back pain.  Neurological: Negative for dizziness and loss of consciousness.  All other systems reviewed and are negative.   Blood pressure 143/93, pulse 104, temperature 98.1 F (36.7 C), temperature source Oral, resp. rate 18, height _0  (1.905 m), weight 282 lb 12.8 oz (128.277 kg), SpO2 98.00%. Physical Exam  Constitutional: He is oriented to person, place, and time. He appears well-developed and well-nourished. No distress.  Neck: No JVD present. Carotid bruit is not present.  Cardiovascular: Normal rate, regular rhythm, normal heart sounds and intact distal pulses.  Exam reveals no gallop and no friction rub.   No murmur heard. Respiratory: Effort normal and breath sounds normal. No respiratory distress. He has no wheezes. He has no rales.    GI: Soft. Bowel sounds are normal. He exhibits no distension and no mass. There is no tenderness.  Musculoskeletal: He exhibits no edema.  Neurological: He is alert and oriented to person, place, and time.  Skin: Skin is warm and dry. He is not diaphoretic.  Psychiatric: He has a normal mood and affect. His behavior is normal.   Body mass index is 35.35 kg/(m^2).   Assessment/Plan: Active Problems:   Obesity, Class II, BMI 35-39.9   Presence of drug coated stent in right coronary artery - Promus 3.0 mm x 24 mm - post dilated to 3.3 mm    Hyperglycemia   DKA, type 2   HTN (hypertension)  Plan: 43 y/o AAM with a h/o early CAD, s/p STEMI in 2013 resulting in a DES to the RCA,  normal systolic function in 0258, also with multiple risk factors for recurrent cardiovascular events, including obesity, DM, tobacco abuse, HTN, DLD and medication noncompliance, who is being evaluated for preparative clearance before undergoing  a lumbar laminectomy/ decompression.   1. Coronary Artery Disease w/ Presence of DES in RCA: Pt denies any angina since his MI in 2013. His resting EKG today demonstrated sinus tach w/ ventricular rate of 103 bpm, but was otherwise normal, w/o any ischemic changes. However he notes DOE, walking short distances, and decreased exercise tolerance. ? If this is an anginal equivalent, or just secondary to obesity/ deconditioning.     MD to follow with further recommendations.   SIMMONS, [place] 08/13/2013, 3:04 PM   I have examined the patient and reviewed assessment and plan and discussed with patient.  Agree with above as stated.  He is able  to walk up a flight of stairs to work with minimal DOE.  No sx like what he had with MI.  I think he is lower risk for surgery with cardiac complication rate  <2% given his history.  Cannot lower this risk any more with any testing.  Therefore, would proceed with surgery. Stressed importance of RF modification including diabetes and  lipids.  He needs longterm f/u with Dr. Ellyn Hack.  Larene Ascencio S.

## 2013-08-13 NOTE — ED Notes (Signed)
Patient sent from short stay after surgical labs came back with high blood sugar reading of 791.   Patient states "I feel fine".  Patient states he has recently had steroids for back pain and has been drinking a lot of juice at home for the last 2 weeks.   Patient complaint of back pain only.

## 2013-08-13 NOTE — Pre-Procedure Instructions (Signed)
George Dickson  08/13/2013   Your procedure is scheduled on:  Tuesday, August 14, 2013 at 7:30 AM.   Report to [facility] Entrance "A" Admitting Office at 5:30 AM.   Call this number if you have problems the morning of surgery: [phone]   Remember:   Do not eat food or drink liquids after midnight tonight   Take these medicines the morning of surgery with A SIP OF WATER: metoprolol tartrate (LOPRESSOR), predniSONE (DELTASONE), HYDROcodone-acetaminophen (NORCO/VICODIN) - if needed.  Stop Aspirin and Plavix today if you have not already done so.     Do not wear jewelry.  Do not wear lotions, powders, or cologne. You may wear deodorant.  Men may shave face and neck.  Do not bring valuables to the hospital.  [facility] is not responsible                  for any belongings or valuables.               Contacts, dentures or bridgework may not be worn into surgery.  Leave suitcase in the car. After surgery it may be brought to your room.  For patients admitted to the hospital, discharge time is determined by your                treatment team.               Patients discharged the day of surgery will not be allowed to drive  home.  Name and phone number of your driver: Family/friend   Special Instructions: [facility] - Preparing for Surgery  Before surgery, you can play an important role.  Because skin is not sterile, your skin needs to be as [place] of germs as possible.  You can reduce the number of germs on you skin by washing with CHG (chlorahexidine gluconate) soap before surgery.  CHG is an antiseptic cleaner which kills germs and bonds with the skin to continue killing germs even after washing.  Please DO NOT use if you have an allergy to CHG or antibacterial soaps.  If your skin becomes reddened/irritated stop using the CHG and inform your nurse when you arrive at Short Stay.  Do not shave (including legs and underarms) for at least 48 hours prior to the first CHG  shower.  You may shave your face.  Please follow these instructions carefully:   1.  Shower with CHG Soap the night before surgery and the                                morning of Surgery.  2.  If you choose to wash your hair, wash your hair first as usual with your       normal shampoo.  3.  After you shampoo, rinse your hair and body thoroughly to remove the                      Shampoo.  4.  Use CHG as you would any other liquid soap.  You can apply chg directly       to the skin and wash gently with scrungie or a clean washcloth.  5.  Apply the CHG Soap to your body ONLY FROM THE NECK DOWN.        Do not use on open wounds or open sores.  Avoid contact with your eyes, ears, mouth and  genitals (private parts).  Wash genitals (private parts) with your normal soap.  6.  Wash thoroughly, paying special attention to the area where your surgery        will be performed.  7.  Thoroughly rinse your body with warm water from the neck down.  8.  DO NOT shower/wash with your normal soap after using and rinsing off       the CHG Soap.  9.  Pat yourself dry with a clean towel.            10.  Wear clean pajamas.            11.  Place clean sheets on your bed the night of your first shower and do not        sleep with pets.  Day of Surgery  Do not apply any lotions the morning of surgery.  Please wear clean clothes to the hospital/surgery center.     Please read over the following fact sheets that you were given: Pain Booklet, Coughing and Deep Breathing, MRSA Information and Surgical Site Infection Prevention

## 2013-08-13 NOTE — Progress Notes (Signed)
Lab called with "critical high" blood sugar of 791. Spoke with Gabriel Cirri, RN in the ED and gave her that result.

## 2013-08-13 NOTE — Progress Notes (Signed)
During PAT appt, pt stated that he has been extremely thirsty for past 1-2 weeks. When asked if he was urinating a lot, he stated yes and getting up several times at night. Also has noted an approx 10-14 lb weight loss. CBG done and it registered "High". I spoke with Dr. Redmond School (anesthesiologist) and he agreed that pt needed to go to the ED. I have informed pt and his mother and will take him to the ED when labs, xray and EKG done here. Lorriane Shire, at Dr. Marchelle Folks office also notified.

## 2013-08-14 ENCOUNTER — Telehealth: Payer: Self-pay | Admitting: Cardiology

## 2013-08-14 LAB — BASIC METABOLIC PANEL
BUN: 12 mg/dL (ref 6–23)
BUN: 12 mg/dL (ref 6–23)
CALCIUM: 8.9 mg/dL (ref 8.4–10.5)
CHLORIDE: 102 meq/L (ref 96–112)
CO2: 23 meq/L (ref 19–32)
CO2: 25 mEq/L (ref 19–32)
CREATININE: 0.97 mg/dL (ref 0.50–1.35)
Calcium: 8.9 mg/dL (ref 8.4–10.5)
Chloride: 103 mEq/L (ref 96–112)
Creatinine, Ser: 0.96 mg/dL (ref 0.50–1.35)
GFR calc Af Amer: >90 mL/min (ref >90)
GFR calc Af Amer: >90 mL/min (ref >90)
GFR calc non Af Amer: >90 mL/min (ref >90)
GFR calc non Af Amer: >90 mL/min (ref >90)
GLUCOSE: 144 mg/dL — AB (ref 70–99)
Glucose, Bld: 198 mg/dL — ABNORMAL HIGH (ref 70–99)
Potassium: 4 mEq/L (ref 3.7–5.3)
Potassium: 4.3 mEq/L (ref 3.7–5.3)
Sodium: 138 mEq/L (ref 137–147)
Sodium: 139 mEq/L (ref 137–147)

## 2013-08-14 LAB — GLUCOSE, CAPILLARY
GLUCOSE-CAPILLARY: 158 mg/dL — AB (ref 70–99)
GLUCOSE-CAPILLARY: 160 mg/dL — AB (ref 70–99)
GLUCOSE-CAPILLARY: 216 mg/dL — AB (ref 70–99)
GLUCOSE-CAPILLARY: 246 mg/dL — AB (ref 70–99)
GLUCOSE-CAPILLARY: 280 mg/dL — AB (ref 70–99)
Glucose-Capillary: 155 mg/dL — ABNORMAL HIGH (ref 70–99)
Glucose-Capillary: 147 mg/dL — ABNORMAL HIGH (ref 70–99)
Glucose-Capillary: 176 mg/dL — ABNORMAL HIGH (ref 70–99)
Glucose-Capillary: 140 mg/dL — ABNORMAL HIGH (ref 70–99)
Glucose-Capillary: 123 mg/dL — ABNORMAL HIGH (ref 70–99)
Glucose-Capillary: 134 mg/dL — ABNORMAL HIGH (ref 70–99)
Glucose-Capillary: 146 mg/dL — ABNORMAL HIGH (ref 70–99)
Glucose-Capillary: 252 mg/dL — ABNORMAL HIGH (ref 70–99)

## 2013-08-14 LAB — LIPID PANEL
CHOL/HDL RATIO: 12 ratio
CHOLESTEROL: 240 mg/dL — AB (ref 0–200)
HDL: 20 mg/dL — ABNORMAL LOW (ref >39)
LDL CALC: UNDETERMINED mg/dL (ref 0–99)
TRIGLYCERIDES: 441 mg/dL — AB (ref <150)
VLDL: UNDETERMINED mg/dL (ref 0–40)

## 2013-08-14 LAB — CBC
HEMATOCRIT: 41 % (ref 39.0–52.0)
Hemoglobin: 14.3 g/dL (ref 13.0–17.0)
MCH: 28.3 pg (ref 26.0–34.0)
MCHC: 34.9 g/dL (ref 30.0–36.0)
MCV: 81.2 fL (ref 78.0–100.0)
Platelets: 180 10*3/uL (ref 150–400)
RBC: 5.05 MIL/uL (ref 4.22–5.81)
RDW: 12.9 % (ref 11.5–15.5)
WBC: 8.9 10*3/uL (ref 4.0–10.5)

## 2013-08-14 MED ORDER — INSULIN DETEMIR 100 UNIT/ML ~~LOC~~ SOLN
20.0000 [IU] | Freq: Once | SUBCUTANEOUS | Status: DC
  Filled 2013-08-14: qty 0.2

## 2013-08-14 MED ORDER — INSULIN ASPART 100 UNIT/ML ~~LOC~~ SOLN
0.0000 [IU] | Freq: Three times a day (TID) | SUBCUTANEOUS | Status: DC
  Administered 2013-08-14: 5 [IU] via SUBCUTANEOUS
  Administered 2013-08-14: 1 [IU] via SUBCUTANEOUS
  Administered 2013-08-14 – 2013-08-15 (×3): 5 [IU] via SUBCUTANEOUS

## 2013-08-14 MED ORDER — SODIUM CHLORIDE 0.9 % IV SOLN
INTRAVENOUS | Status: DC
  Administered 2013-08-14: 05:00:00 via INTRAVENOUS

## 2013-08-14 MED ORDER — "BD GETTING STARTED TAKE HOME KIT: 1ML X 30 G SYRINGES, ": 1.0000 | Freq: Once | Status: DC

## 2013-08-14 MED ORDER — INSULIN ASPART 100 UNIT/ML ~~LOC~~ SOLN
6.0000 [IU] | Freq: Three times a day (TID) | SUBCUTANEOUS | Status: DC
  Administered 2013-08-14 – 2013-08-15 (×4): 6 [IU] via SUBCUTANEOUS

## 2013-08-14 MED ORDER — LIVING WELL WITH DIABETES BOOK
Freq: Once | Status: AC
  Administered 2013-08-14: 09:00:00
  Filled 2013-08-14: qty 1

## 2013-08-14 MED ORDER — INSULIN DETEMIR 100 UNIT/ML ~~LOC~~ SOLN
30.0000 [IU] | Freq: Once | SUBCUTANEOUS | Status: AC
  Administered 2013-08-14: 30 [IU] via SUBCUTANEOUS
  Filled 2013-08-14: qty 0.3

## 2013-08-14 MED ORDER — DEXTROSE-NACL 5-0.45 % IV SOLN
INTRAVENOUS | Status: DC
  Administered 2013-08-14: 03:00:00 via INTRAVENOUS

## 2013-08-14 MED ORDER — BD GETTING STARTED TAKE HOME KIT: 3/10ML X 30G SYRINGES
1.0000 | Freq: Once | Status: AC
  Administered 2013-08-14: 1
  Filled 2013-08-14: qty 1

## 2013-08-14 MED ORDER — HYDROMORPHONE HCL PF 1 MG/ML IJ SOLN
1.0000 mg | INTRAMUSCULAR | Status: DC | PRN
  Administered 2013-08-14 – 2013-08-15 (×7): 1 mg via INTRAVENOUS
  Filled 2013-08-14 (×8): qty 1

## 2013-08-14 MED ORDER — INSULIN ASPART 100 UNIT/ML ~~LOC~~ SOLN: 4.0000 [IU] | Freq: Three times a day (TID) | SUBCUTANEOUS | Status: DC

## 2013-08-14 NOTE — Progress Notes (Signed)
Inpatient Diabetes Program Recommendations  AACE/ADA: New Consensus Statement on Inpatient Glycemic Control (2013)  Target Ranges:  Prepandial:   less than 140 mg/dL      Peak postprandial:   less than 180 mg/dL (1-2 hours)      Critically ill patients:  140 - 180 mg/dL   Reason for Visit: MD Consult regarding new-onset diabetes  Diabetes history: newly diagnosesed Outpatient Diabetes medications: N/A Current orders for Inpatient glycemic control: Lantus 30 units, 6 units Novolog meal coverage, sensitive Novolog correction  Note: Patient has transitioned off insulin drip.  Patient states he prepared and gave own insulin this morning.  Children and mother arrived while I was there.  Patient has not had a chance to watch any videos or look at any DM educational materials yet.    Showed patient use of an insulin pen.  According to medication coverage look-up, appears that pens are covered.  Case Manager to try to determine which insulins are covered the best along with vial vs pen.  (Levemir versus Lantus, Novolog versus Humalog)  Will need Rx for the following at discharge:  Basal insulin (either Levemir or Lantus) - preferably pen  Rapid acting insulin (either Humalog or Novolog) - preferably pen  Pen needles (Generic pen needles, or Carefine pen needles 32GX26mm or Carefine pen needles 32 GX81mm)   or generic 1/2 cc insulin syringes if pens not covered  Glucose meter- generic  Glucose meter test strips - generic  Generic Lancets for glucose meter  Patient receptive to diabetes education as an outpatient after he recovers from back surgery.  Mother is actively participating in program at [facility].  Will place referral with [facility] and ask he be scheduled after surgery.  Instructed patient/children regarding access of diabetes videos through the Patient Education Channel.   Thank you for the referral.  Nykia Turko S. Marcelline Mates, RN, MSN, CDE

## 2013-08-14 NOTE — Progress Notes (Signed)
Text paged Schorr,NP that pt's BMET results have resulted. Will continue to monitor pt. Ranelle Oyster, RN

## 2013-08-14 NOTE — Progress Notes (Signed)
Utilization review completed.  

## 2013-08-14 NOTE — Progress Notes (Signed)
Nutrition Brief Note  Patient identified on the Malnutrition Screening Tool (MST) Report. Pt with new onset DM. States that his appetite is great. We discussed weight loss likely 2/2 uncontrolled blood sugars. Pt verbalized understanding.  Wt Readings from Last 15 Encounters:  08/14/13 287 lb 14.7 oz (130.6 kg)  08/13/13 286 lb (129.729 kg)  10/23/11 299 lb 13.2 oz (136 kg)  10/23/11 299 lb 13.2 oz (136 kg)    Body mass index is 35.99 kg/(m^2). Patient meets criteria for Obese Class II based on current BMI.   Current diet order is Carbohydrate Modified Medium, patient is consuming approximately 100% of meals at this time. Labs and medications reviewed.   No nutrition interventions warranted at this time. If nutrition issues arise, please consult RD.   Inda Coke MS, RD, LDN Inpatient Registered Dietitian Pager: [phone] After-hours pager: [phone]

## 2013-08-14 NOTE — Progress Notes (Signed)
Schorr, NP called and stated to continue insulin drip/glucostablizer at this time. Will continue to monitor pt. Ranelle Oyster, RN

## 2013-08-14 NOTE — Progress Notes (Signed)
Notified Schorr, NP that pt's BMET results has resulted. Schorr, NP stated to continue insulin drip/glucostablizer. NP gave order to change IVF to D51/2NS@50cc /hr. Will continue to monitor pt. Ranelle Oyster, RN

## 2013-08-14 NOTE — Telephone Encounter (Signed)
Noted  

## 2013-08-14 NOTE — Telephone Encounter (Signed)
Just wanted you to know that pt was admitted to the hospital,that is why de did not show for his appointment yesterday.

## 2013-08-14 NOTE — Plan of Care (Signed)
Problem: Food- and Nutrition-Related Knowledge Deficit (NB-1.1) Goal: Nutrition education Formal process to instruct or train a patient/client in a skill or to impart knowledge to help patients/clients voluntarily manage or modify food choices and eating behavior to maintain or improve health. Outcome: Completed/Met Date Met:  08/14/13  RD consulted for nutrition education regarding diabetes.     Lab Results  Component Value Date    HGBA1C 12.9* 08/13/2013    RD provided "Carbohydrate Counting for People with Diabetes" handout from the Academy of Nutrition and Dietetics. Discussed different food groups and their effects on blood sugar, emphasizing carbohydrate-containing foods. Provided list of carbohydrates and recommended serving sizes of common foods. Also provided Balanced Plate handout.  Discussed importance of controlled and consistent carbohydrate intake throughout the day. Provided examples of ways to balance meals/snacks and encouraged intake of high-fiber, whole grain complex carbohydrates. Teach back method used.  Expect fair compliance.  Body mass index is 35.99 kg/(m^2). Pt meets criteria for Obese Class II based on current BMI.  Current diet order is Carbohydrate Modified Medium, patient is consuming approximately 100% of meals at this time. Labs and medications reviewed. No further nutrition interventions warranted at this time. RD contact information provided. If additional nutrition issues arise, please re-consult RD.  Inda Coke MS, RD, LDN Inpatient Registered Dietitian Pager: [phone] After-hours pager: [phone]

## 2013-08-14 NOTE — Progress Notes (Signed)
Pt was educated on administration of insulin. Pt was able to successfully test his own blood sugar and self-administer insulin. Also discussed normal blood glucose levels with pt and how to rotate injection sites. Will continue to monitor.

## 2013-08-14 NOTE — Progress Notes (Addendum)
PROGRESS NOTE  Yves Fodor Million FIE:[id number] DOB: 30-Mar-1971 DOA: 08/13/2013 PCP: Ron Parker, MD  Subjective: 43 y.o. Male with history of CAD, s/p STEMI in June 2013, who was admitted yesterday with a blood glucose reading of 791 at a presurgical office appointment. He was scheduled for a laminectomy today to repair his ruptured lumbar disc. Today polydipsia and polyuria have resolved. Pt is still complaining of severe back pain.  Assessment/Plan:  Hyperglycemia/Hyperosmolar nonketotic state.  -CBG of 791 on admission. CBGs have remained stable since admission. -Has mild acidosis with bicarbonate of 18, no significant ketosis, ketones are 15 in the urine -IV fluids, IV insulin, glucose stabilizer.  -Anion gap of 20 on admission.  Closed over night. -Patient receiving DM, nutrition and insulin education prior to d/c -Continue to monitor CBG and BMET.   Back pain  -Secondary to ruptured disc.  -neuro surgery rescheduled until Friday with Dr. Annette Stable. -Continue IV morphine prn q 3 hours, hydromorphone, tylenol, and Metaxalone.   Coronary Artery Disease  -STEMI in June 2013 with stent placement.  -Continue Lopressor, cards consulted yesterday and has cleared pt for surgery on Friday.   Dyslipidemia  -Stable.  -Continue atorvastatin.   GERD  -stable  -Continue Maalox/Mylanta suspension   DVT Prophylaxis:  Lovenox  Code Status: full Family Communication: patient states understanding Disposition Plan: home when medically appropriate. Most likely d/c tomorrow.   Consultants:  Cardio for pre-op clearance  Procedures:  none  Antibiotics:  none  Objective: Filed Vitals:   08/13/13 1416 08/14/13 0140 08/14/13 0447 08/14/13 0938  BP: 143/93 151/80 142/80 138/74  Pulse: 104 83 66 94  Temp:  98.2 F (36.8 C) 97.7 F (36.5 C) 98 F (36.7 C)  TempSrc:  Oral Oral Oral  Resp: 18 18 18    Height: 6\' 3"  (1.905 m)     Weight: 128.277 kg (282 lb 12.8 oz)  130.6 kg (287 lb  14.7 oz)   SpO2: 98% 93% 99%     Intake/Output Summary (Last 24 hours) at 08/14/13 1157 Last data filed at 08/14/13 1036  Gross per 24 hour  Intake 3765.42 ml  Output    350 ml  Net 3415.42 ml   Filed Weights   08/13/13 1416 08/14/13 0447  Weight: 128.277 kg (282 lb 12.8 oz) 130.6 kg (287 lb 14.7 oz)    Exam: General: Well developed, well nourished, mild distress due to back pain, appears stated age  Cardiovascular: RRR, S1 S2 auscultated, no rubs, murmurs or gallops.   Respiratory: Clear to auscultation bilaterally with equal chest rise  Abdomen: Soft, nontender, nondistended, + bowel sounds  Extremities: warm dry without cyanosis clubbing or edema.  Neuro: AAOx3, cranial nerves grossly intact. Able to move upper and lower extremities  Skin: Without rashes exudates or nodules.   Psych: Normal affect and demeanor with intact judgement and insight   Data Reviewed: Basic Metabolic Panel:  Recent Labs Lab 08/13/13 1549 08/13/13 1800 08/13/13 2020 08/14/13 0156 08/14/13 0507  NA 135* 138 139 138 139  K 4.2 4.0 3.8 4.0 4.3  CL 98 100 101 102 103  CO2 24 25 24 23 25   GLUCOSE 329* 191* 228* 144* 198*  BUN 12 11 12 12 12   CREATININE 0.93 0.93 0.95 0.97 0.96  CALCIUM 9.2 9.5 8.9 8.9 8.9   Liver Function Tests:  Recent Labs Lab 08/13/13 0855  AST 19  ALT 26  ALKPHOS 110  BILITOT 0.7  PROT 7.9  ALBUMIN 4.2   CBC:  Recent Labs Lab 08/13/13 0855 08/13/13 1445 08/14/13 0156  WBC 10.6* 9.0 8.9  NEUTROABS 6.4  --   --   HGB 16.9 15.6 14.3  HCT 46.7 43.4 41.0  MCV 80.2 80.1 81.2  PLT 194 183 180   CBG:  Recent Labs Lab 08/14/13 0652 08/14/13 0752 08/14/13 0853 08/14/13 0952 08/14/13 1153  GLUCAP 140* 123* 134* 146* 252*    Recent Results (from the past 240 hour(s))  SURGICAL PCR SCREEN     Status: None   Collection Time    08/13/13  8:54 AM      Result Value Ref Range Status   MRSA, PCR NEGATIVE  NEGATIVE Final   Staphylococcus aureus NEGATIVE   NEGATIVE Final   Comment:            The Xpert SA Assay (FDA     approved for NASAL specimens     in patients over 47 years of age),     is one component of     a comprehensive surveillance     program.  Test performance has     been validated by [employer] for patients greater     than or equal to 66 year old.     It is not intended     to diagnose infection nor to     guide or monitor treatment.     Studies: Dg Chest 2 View  08/13/2013   CLINICAL DATA:  Preop for lumbar spine surgery.  EXAM: CHEST  2 VIEW  COMPARISON:  04/17/2013.  FINDINGS: The cardiac silhouette, mediastinal and hilar contours are within normal limits and stable. A coronary artery stent is noted. Minimal streaky scarring changes but no infiltrates, edema or effusions. The bony thorax is intact.  IMPRESSION: No acute cardiopulmonary findings.   Electronically Signed   By: Kalman Jewels M.D.   On: 08/13/2013 10:06    Scheduled Meds: . atorvastatin  40 mg Oral Daily  . docusate sodium  100 mg Oral BID  . enoxaparin (LOVENOX) injection  40 mg Subcutaneous Q24H  . insulin aspart  0-9 Units Subcutaneous TID WC  . insulin aspart  6 Units Subcutaneous TID WC  . metaxalone  800 mg Oral BID  . metoprolol tartrate  25 mg Oral Daily  . sodium chloride  3 mL Intravenous Q12H   Continuous Infusions: . insulin (NOVOLIN-R) infusion 3.2 Units/hr (08/14/13 0758)  . insulin (NOVOLIN-R) infusion 6.4 Units/hr (08/14/13 0903)     Estrella Myrtle, PA-S Imogene Burn, PA-C [facility] Pager [phone]. If 7PM-7AM, please contact night-coverage at [email or web address], password [facility] 08/14/2013, 11:57 AM  LOS: 1 day     Addendum  Patient seen and examined, chart and data base reviewed.  I agree with the above assessment and plan.  For full details please see Mrs. Estrella Myrtle, PA-S and Mrs. Imogene Burn PA note.  I reviewed and addended the above note as needed.   Birdie Hopes, MD [facility] Pager: [phone] 08/14/2013, 4:11 PM

## 2013-08-14 NOTE — Progress Notes (Signed)
Notified Schorr, NP that pt's CBG is 216. NP D/c D51/2NS and chnaged IVF to NS@125cc /hr. NP ordered BMET. NP stated to continue glucostablizer/insulin drip. Will continue to monitor pt. Ranelle Oyster, RN

## 2013-08-14 NOTE — Progress Notes (Signed)
Notified Schorr, NP that pt has had 3 CBG's within range on glucostablizer. Schorr, NP ordered stat BMET and stated to continue on glucostablizer/insulin drip. NP stated to call with BMET results. Will continue to monitor pt. Ranelle Oyster, RN

## 2013-08-15 DIAGNOSIS — Z794 Long term (current) use of insulin: Secondary | ICD-10-CM | POA: Diagnosis present

## 2013-08-15 DIAGNOSIS — E119 Type 2 diabetes mellitus without complications: Secondary | ICD-10-CM

## 2013-08-15 DIAGNOSIS — E118 Type 2 diabetes mellitus with unspecified complications: Secondary | ICD-10-CM | POA: Diagnosis present

## 2013-08-15 LAB — BASIC METABOLIC PANEL
BUN: 9 mg/dL (ref 6–23)
CO2: 25 mEq/L (ref 19–32)
Calcium: 8.9 mg/dL (ref 8.4–10.5)
Chloride: 99 mEq/L (ref 96–112)
Creatinine, Ser: 0.92 mg/dL (ref 0.50–1.35)
GFR calc Af Amer: >90 mL/min (ref >90)
Glucose, Bld: 294 mg/dL — ABNORMAL HIGH (ref 70–99)
POTASSIUM: 4.5 meq/L (ref 3.7–5.3)
SODIUM: 135 meq/L — AB (ref 137–147)

## 2013-08-15 LAB — GLUCOSE, CAPILLARY
GLUCOSE-CAPILLARY: 272 mg/dL — AB (ref 70–99)
Glucose-Capillary: 274 mg/dL — ABNORMAL HIGH (ref 70–99)

## 2013-08-15 MED ORDER — INSULIN LISPRO 100 UNIT/ML (KWIKPEN): 8.0000 [IU] | PEN_INJECTOR | Freq: Three times a day (TID) | SUBCUTANEOUS | Status: DC

## 2013-08-15 MED ORDER — "NEEDLE (DISP) 30G X 1"" MISC": 1.0000 [IU] | Freq: Three times a day (TID) | Status: DC

## 2013-08-15 MED ORDER — DSS 100 MG PO CAPS: 100.0000 mg | ORAL_CAPSULE | Freq: Two times a day (BID) | ORAL | Status: DC

## 2013-08-15 MED ORDER — MAGNESIUM HYDROXIDE 400 MG/5ML PO SUSP
30.0000 mL | Freq: Once | ORAL | Status: AC
  Administered 2013-08-15: 30 mL via ORAL
  Filled 2013-08-15: qty 30

## 2013-08-15 MED ORDER — INSULIN DETEMIR 100 UNIT/ML ~~LOC~~ SOLN
30.0000 [IU] | Freq: Every day | SUBCUTANEOUS | Status: DC
  Administered 2013-08-15: 30 [IU] via SUBCUTANEOUS
  Filled 2013-08-15: qty 0.3

## 2013-08-15 MED ORDER — INSULIN GLARGINE 100 UNIT/ML SOLOSTAR PEN: 30.0000 [IU] | PEN_INJECTOR | Freq: Every day | SUBCUTANEOUS | Status: DC

## 2013-08-15 NOTE — Discharge Summary (Signed)
Physician Discharge Summary  George Dickson WVP:[id number] DOB: 1971/01/21 DOA: 08/13/2013  PCP: Ron Parker, MD  Admit date: 08/13/2013 Discharge date: 08/15/2013  Time spent: 45 minutes  Recommendations for Outpatient Follow-up:  1. Follow up with PCP on new diabetes diagnosis. Patient started on insulin 2. Triglycerides elevated despite being on statin.  Insulin will help.  For PCP follow up. 3. Proceed with scheduled laminectomy on 3/27.  Discharge Diagnoses:  Principal Problem:   Hyperglycemic hyperosmolar nonketotic state Active Problems:   Obesity, Class II, BMI 35-39.9   Presence of drug coated stent in right coronary artery - Promus 3.0 mm x 24 mm - post dilated to 3.3 mm    Hyperglycemia   HTN (hypertension)   Coronary artery disease   Old myocardial infarction   Discharge Condition: stable  Diet recommendation: heart healthy, carb modified  History of present illness:  43 y.o. Male with history of CAD, s/p STEMI in June 2013, who was admitted on 3/23 with a blood glucose reading of 791 at a presurgical office appointment. He was scheduled for a laminectomy on 3/24 to repair his ruptured lumbar disc but that has been rescheduled for 3/27.   Hospital Course:  Hyperglycemia/Hyperosmolar nonketotic state. New Diagnosis of DMII -CBG of 791 on admission. CBGs remained stable while inpatient.  -Had mild acidosis with bicarbonate of 18, no significant ketosis, ketones were 15 in the urine.   -Anion gap of 20 on admission. Closed over night.  -Patient treated with IV fluids, IV insulin, glucose stabilizer. -DM, nutrition and insulin education given prior to d/c.  -Patient will be discharged on lantus and humalog.  Back pain  -Secondary to ruptured disc.  -neuro surgery rescheduled until Friday with Dr. Annette Stable.  -Treated with IV morphine prn q 3 hours, hydromorphone, tylenol, and Metaxalone as an inpatient. -Will be discharged on "prior to admission" pain  medications.  Coronary Artery Disease  -STEMI in June 2013 with stent placement.  -Treated with Lopressor, cards consulted while inpatient and cleared pt for surgery on Friday.   Dyslipidemia  -TG of 441, HDL of 20, LDL unable to calculate.  -Treated with atorvastatin.  -Follow up with PCP.  Consider adding Omega 3 / Lopid.  GERD  -stable  -Treated with Maalox/Mylanta suspension   Procedures:  none  Consultations:  Cardio for pre-op clearance   Discharge Exam: Filed Vitals:   08/15/13 1027  BP: 153/92  Pulse: 82  Temp: 97.6 F (36.4 C)  Resp: 20   Filed Weights   08/13/13 1416 08/14/13 0447 08/15/13 0510  Weight: 128.277 kg (282 lb 12.8 oz) 130.6 kg (287 lb 14.7 oz) 133.6 kg (294 lb 8.6 oz)     General: Well developed, well nourished, mild distress due to back pain, appears stated age  Cardiovascular: RRR, S1 S2 auscultated, no rubs, murmurs or gallops.  Respiratory: Clear to auscultation bilaterally with equal chest rise  Abdomen: Soft, nontender, nondistended, + bowel sounds  Extremities: warm dry without cyanosis clubbing or edema.  Neuro: AAOx3, cranial nerves grossly intact. Able to move upper and lower extremities  Skin: Without rashes exudates or nodules.  Psych: Normal affect and demeanor with intact judgement and insight   Discharge Instructions      Discharge Orders   Future Appointments Provider Department Dept Phone   10/08/2013 9:15 AM Anne Shutter, [place] [phone]   Future Orders Complete By Expires   Ambulatory referral to Nutrition and Diabetic Education  As  directed    Comments:     New onset diabetes incidentally diagnosed with pre-op labs for back surgery.  Back surgery rescheduled for August 17, 2013.  Will need to wait until he has recovered enough from back surgery to attend.  Mother is an active participant at [facility].   Diet - low sodium heart healthy  As directed    Increase  activity slowly  As directed        Medication List    STOP taking these medications       predniSONE 20 MG tablet  Commonly known as:  DELTASONE      TAKE these medications       atorvastatin 40 MG tablet  Commonly known as:  LIPITOR  Take 40 mg by mouth daily.     clopidogrel 75 MG tablet  Commonly known as:  PLAVIX  Take 75 mg by mouth daily.     DSS 100 MG Caps  Take 100 mg by mouth 2 (two) times daily.     HYDROcodone-acetaminophen 5-325 MG per tablet  Commonly known as:  NORCO/VICODIN  Take 1-2 tablets by mouth every 6 (six) hours as needed for moderate pain.     HYDROmorphone 2 MG tablet  Commonly known as:  DILAUDID  Take 2 mg by mouth every 4 (four) hours as needed for severe pain (may take 1 to 2 tabs every 4 hours as needed).     ibuprofen 200 MG tablet  Commonly known as:  ADVIL,MOTRIN  Take 800 mg by mouth every 6 (six) hours as needed for mild pain.     Insulin Glargine 100 UNIT/ML Solostar Pen  Commonly known as:  LANTUS  Inject 30 Units into the skin daily.     insulin lispro 100 UNIT/ML KiwkPen  Commonly known as:  HUMALOG  Inject 8 Units into the skin 3 (three) times daily with meals.     metaxalone 800 MG tablet  Commonly known as:  SKELAXIN  Take 800 mg by mouth 2 (two) times daily.     metoprolol tartrate 25 MG tablet  Commonly known as:  LOPRESSOR  Take 25 mg by mouth daily.     NEEDLE (DISP) 30 G 30G X 1" Misc  Commonly known as:  B-D DISP NEEDLE 30GX1"  1 Units by Does not apply route 4 (four) times daily -  before meals and at bedtime.       No Known Allergies Follow-up Information   Follow up with Ron Parker, MD. Schedule an appointment as soon as possible for a visit in 1 week.   Specialty:  Family Medicine   Contact information:   [address] [place] [address] [phone]        The results of significant diagnostics from this hospitalization (including imaging, microbiology, ancillary and  laboratory) are listed below for reference.    Significant Diagnostic Studies: Dg Eye Foreign Body  08/05/2013   CLINICAL DATA:  Metal working/exposure; clearance prior to MRI  EXAM: ORBITS FOR FOREIGN BODY - 2 VIEW  COMPARISON:  None.  FINDINGS: There is no evidence of metallic foreign body within the orbits. No significant bone abnormality identified.  IMPRESSION: No evidence of metallic foreign body within the orbits.   Electronically Signed   By: Rozetta Nunnery M.D.   On: 08/05/2013 16:32   Dg Chest 2 View  08/13/2013   CLINICAL DATA:  Preop for lumbar spine surgery.  EXAM: CHEST  2 VIEW  COMPARISON:  04/17/2013.  FINDINGS: The cardiac silhouette, mediastinal and hilar contours are within normal limits and stable. A coronary artery stent is noted. Minimal streaky scarring changes but no infiltrates, edema or effusions. The bony thorax is intact.  IMPRESSION: No acute cardiopulmonary findings.   Electronically Signed   By: Kalman Jewels M.D.   On: 08/13/2013 10:06   Mr Lumbar Spine Wo Contrast  08/05/2013   CLINICAL DATA:  Severe right leg pain and burning  EXAM: MRI LUMBAR SPINE WITHOUT CONTRAST  TECHNIQUE: Multiplanar, multisequence MR imaging was performed. No intravenous contrast was administered.  COMPARISON:  None available.  FINDINGS: For the purposes of this dictation, the lowest well-formed intervertebral disc spaces presumed to be the L5-S1 level, and there presumed to be 5 lumbar type vertebral bodies.  Study is somewhat limited by motion artifact. An addition, no STIR sequences available for review at time of dictation.  The vertebral bodies are normally aligned with preservation of the normal lumbar lordosis. Vertebral body heights are preserved. Signal intensity within the vertebral body bone marrow and visualized spinal cord is within normal limits. The conus medullaris terminates at the L1 level.  At T12-L1, seen only on sagittal projection, there is no disc bulge or disc protrusion. The  intervertebral disc is well hydrated. No facet arthrosis. No canal or neural foraminal stenosis.  At L1-2, there is no disc bulge or disc protrusion. Intervertebral disc is well hydrated. No significant facet arthrosis identified. No canal or neural foraminal stenosis.  At L2-3, there is no disc bulge or disc protrusion. The intervertebral disc is well hydrated. Mild bilateral facet hypertrophy is likely present, although evaluation somewhat limited due to motion artifact. No canal or neural foraminal stenosis.  At L3-4, there is no disc bulge or disc protrusion. Intervertebral disc is well hydrated. Mild bilateral facet hypertrophy is present, left slightly greater than right. No canal or neural foraminal stenosis.  At L4-5, there is diffuse disc bulge with disc desiccation and mild bilateral facet hypertrophy. Associated Modic endplate changes seen about the L4-5 intervertebral disc space. A superimposed right central/foraminal disc protrusion is present, indenting the right ventral thecal sac and encroaching upon the right lateral recess (series 7, image 25). There is a sequestered disc fragment located approximately 4 mm inferior to the herniated L4-5 parent disc, just posterior to the mid right L5 vertebral body. The Calvo fragment measures 1.0 x 1.5 x 1.3 cm, and appears to lie a subligamentous location (series 7, image 27). There is secondary impingement of the right L4 and L5 nerve roots at this level. In addition, the [place]ood fragment may abut the transiting S1 nerve root as well. There is mild canal with severe right and moderate left foraminal stenosis.  At L5-S1, there is diffuse disc bulge with disc desiccation. Small superimposed central disc protrusion is present. There is an associated annular fissure posteriorly (series 10, image 6). Mild bilateral facet hypertrophy. No canal stenosis. Moderate bilateral foraminal narrowing.  The limited evaluation of the paraspinous musculature is grossly within normal  limits. Visualized visceral structures are unremarkable.  IMPRESSION: 1. Right central/foraminal disc herniation at L4-5 with associated sequestered disc fragment posterior to the L5 vertebral body with secondary impingement upon the right L4 and L5 nerve roots. There is severe right with moderate left foraminal narrowing at the L4-5 level. 2. Degenerative disc bulging with superimposed small central disc protrusion and associated annular fissure at L5-S1. There is resultant moderate bilateral foraminal stenosis, right slightly worse than left.   Electronically Signed  By: Jeannine Boga M.D.   On: 08/05/2013 21:44    Microbiology: Recent Results (from the past 240 hour(s))  SURGICAL PCR SCREEN     Status: None   Collection Time    08/13/13  8:54 AM      Result Value Ref Range Status   MRSA, PCR NEGATIVE  NEGATIVE Final   Staphylococcus aureus NEGATIVE  NEGATIVE Final   Comment:            The Xpert SA Assay (FDA     approved for NASAL specimens     in patients over 95 years of age),     is one component of     a comprehensive surveillance     program.  Test performance has     been validated by [employer] for patients greater     than or equal to 75 year old.     It is not intended     to diagnose infection nor to     guide or monitor treatment.     Labs: Basic Metabolic Panel:  Recent Labs Lab 08/13/13 1800 08/13/13 2020 08/14/13 0156 08/14/13 0507 08/15/13 0525  NA 138 139 138 139 135*  K 4.0 3.8 4.0 4.3 4.5  CL 100 101 102 103 99  CO2 25 24 23 25 25   GLUCOSE 191* 228* 144* 198* 294*  BUN 11 12 12 12 9   CREATININE 0.93 0.95 0.97 0.96 0.92  CALCIUM 9.5 8.9 8.9 8.9 8.9   Liver Function Tests:  Recent Labs Lab 08/13/13 0855  AST 19  ALT 26  ALKPHOS 110  BILITOT 0.7  PROT 7.9  ALBUMIN 4.2   CBC:  Recent Labs Lab 08/13/13 0855 08/13/13 1445 08/14/13 0156  WBC 10.6* 9.0 8.9  NEUTROABS 6.4  --   --   HGB 16.9 15.6 14.3  HCT 46.7 43.4 41.0   MCV 80.2 80.1 81.2  PLT 194 183 180   CBG:  Recent Labs Lab 08/14/13 1153 08/14/13 1747 08/14/13 2221 08/15/13 0753 08/15/13 1132  GLUCAP 252* 280* 246* 274* 272*     Signed:  [address] [place], [place] [phone] [facility] 08/15/2013, 11:55 AM  Attending Seen and examined, agree with the above assessment and plan. Definitely better CBG control, stable for discharge. Continue with Lantus and NovoLog as outpatient. Further optimization of his medical issues can be done in the outpatient setting.  Nena Alexander MD

## 2013-08-15 NOTE — Discharge Summary (Signed)
[place] to be D/C'd Home per MD order.  Discussed with the patient and all questions fully answered.    Medication List    STOP taking these medications       predniSONE 20 MG tablet  Commonly known as:  DELTASONE      TAKE these medications       atorvastatin 40 MG tablet  Commonly known as:  LIPITOR  Take 40 mg by mouth daily.     clopidogrel 75 MG tablet  Commonly known as:  PLAVIX  Take 75 mg by mouth daily.     DSS 100 MG Caps  Take 100 mg by mouth 2 (two) times daily.     HYDROcodone-acetaminophen 5-325 MG per tablet  Commonly known as:  NORCO/VICODIN  Take 1-2 tablets by mouth every 6 (six) hours as needed for moderate pain.     HYDROmorphone 2 MG tablet  Commonly known as:  DILAUDID  Take 2 mg by mouth every 4 (four) hours as needed for severe pain (may take 1 to 2 tabs every 4 hours as needed).     ibuprofen 200 MG tablet  Commonly known as:  ADVIL,MOTRIN  Take 800 mg by mouth every 6 (six) hours as needed for mild pain.     Insulin Glargine 100 UNIT/ML Solostar Pen  Commonly known as:  LANTUS  Inject 30 Units into the skin daily.     insulin lispro 100 UNIT/ML KiwkPen  Commonly known as:  HUMALOG  Inject 8 Units into the skin 3 (three) times daily with meals.     metaxalone 800 MG tablet  Commonly known as:  SKELAXIN  Take 800 mg by mouth 2 (two) times daily.     metoprolol tartrate 25 MG tablet  Commonly known as:  LOPRESSOR  Take 25 mg by mouth daily.     NEEDLE (DISP) 30 G 30G X 1" Misc  Commonly known as:  B-D DISP NEEDLE 30GX1"  1 Units by Does not apply route 4 (four) times daily -  before meals and at bedtime.        VVS, Skin clean, dry and intact without evidence of skin break down, no evidence of skin tears noted. IV catheter discontinued intact. Site without signs and symptoms of complications. Dressing and pressure applied.  An After Visit Summary was printed and given to the patient.  D/c education completed with  patient/family including follow up instructions, medication list, d/c activities limitations if indicated, with other d/c instructions as indicated by MD - patient able to verbalize understanding, all questions fully answered.   Patient instructed to return to ED, call 911, or call MD for any changes in condition.   Patient escorted via [place], and D/C home via private auto.  Audria Nine F 08/15/2013 4:10 PM

## 2013-08-15 NOTE — Discharge Instructions (Signed)
Take your insulin as directed.  When you see Dr. Hassell Done take your glucose meter for her to read so she can adjust your insulin dose.    Keep your bowels moving.  Take Colace or Docusate sodium everyday to keep your bowel movements soft (almost loose) while you are on narcotic pain medications for your back.  Your Triglycerides are very high.  It is very important to take your lipitor and control your sugar.  This will bring your cholesterol down.

## 2013-08-16 ENCOUNTER — Other Ambulatory Visit: Payer: Self-pay | Admitting: Neurosurgery

## 2013-08-16 NOTE — Progress Notes (Signed)
I was unable to reach patient on the phone.  I called and instructed him to arrive at 0750- or the time he was instructed by Dr Trenton Gammon.. I instructed him to follow instructions given to him at PAT.   I reminded him nothing to eat or drink after midnight and to take Metoprolol with a sip of water in am and pain medication if needed.

## 2013-08-16 NOTE — Care Management Note (Signed)
    Page 1 of 1   08/16/2013     10:38:41 AM   CARE MANAGEMENT NOTE 08/16/2013  Patient:  George Dickson, George Dickson   Account Number:  [id number]  Date Initiated:  08/14/2013  Documentation initiated by:  Tomi Bamberger  Subjective/Objective Assessment:   dx  hyperglycemia  admit- from home.     Action/Plan:   Anticipated DC Date:  08/15/2013   Anticipated DC Plan:  [place]  CM consult      Choice offered to / List presented to:             Status of service:  Completed, signed off Medicare Important Message given?   (If response is "NO", the following Medicare IM given date fields will be blank) Date Medicare IM given:   Date Additional Medicare IM given:    Discharge Disposition:  HOME/SELF CARE  Per UR Regulation:  Reviewed for med. necessity/level of care/duration of stay  If discussed at [place] of Stay Meetings, dates discussed:    Comments:  08/14/13 [place], BSN [phone] patient is from home, NCM did benefit check on lantus pen and humulog and novolog pen and vial insulin,this is covered by [employer], informed PA.

## 2013-08-16 NOTE — Progress Notes (Addendum)
Mr George Dickson called back , he had questions about Insulin in am.  I instructed him to not take any Insulin in am and nothing to eat or drink.  Pt voiced that he will take Metoprolol and pain medication if needed. Mr George Dickson reported that he has not taken Plavix in over a week. Patient said that he told his Dr ( ? which one- he did not know; was seen by cardiologist) when he was in the hospital and was instructed to restart it after surgery. "They did not give it to me while I was in the hospital.

## 2013-08-17 ENCOUNTER — Encounter (HOSPITAL_COMMUNITY): Payer: 59 | Admitting: Vascular Surgery

## 2013-08-17 ENCOUNTER — Ambulatory Visit (HOSPITAL_COMMUNITY): Payer: 59 | Admitting: Certified Registered Nurse Anesthetist

## 2013-08-17 ENCOUNTER — Observation Stay (HOSPITAL_COMMUNITY)
Admission: RE | Admit: 2013-08-17 | Discharge: 2013-08-18 | Disposition: A | Discharge status: Home / Self Care | Payer: 59 | Source: Ambulatory Visit | Attending: Neurosurgery | Admitting: Neurosurgery

## 2013-08-17 ENCOUNTER — Encounter (HOSPITAL_COMMUNITY): Payer: Self-pay | Admitting: *Deleted

## 2013-08-17 ENCOUNTER — Ambulatory Visit (HOSPITAL_COMMUNITY): Payer: 59

## 2013-08-17 ENCOUNTER — Encounter (HOSPITAL_COMMUNITY)
Admission: RE | Disposition: A | Discharge status: Home / Self Care | Payer: Self-pay | Source: Ambulatory Visit | Attending: Neurosurgery

## 2013-08-17 DIAGNOSIS — Z794 Long term (current) use of insulin: Secondary | ICD-10-CM | POA: Insufficient documentation

## 2013-08-17 DIAGNOSIS — E785 Hyperlipidemia, unspecified: Secondary | ICD-10-CM | POA: Insufficient documentation

## 2013-08-17 DIAGNOSIS — E119 Type 2 diabetes mellitus without complications: Secondary | ICD-10-CM | POA: Insufficient documentation

## 2013-08-17 DIAGNOSIS — G4733 Obstructive sleep apnea (adult) (pediatric): Secondary | ICD-10-CM | POA: Insufficient documentation

## 2013-08-17 DIAGNOSIS — K219 Gastro-esophageal reflux disease without esophagitis: Secondary | ICD-10-CM | POA: Insufficient documentation

## 2013-08-17 DIAGNOSIS — Z9861 Coronary angioplasty status: Secondary | ICD-10-CM | POA: Insufficient documentation

## 2013-08-17 DIAGNOSIS — I252 Old myocardial infarction: Secondary | ICD-10-CM | POA: Insufficient documentation

## 2013-08-17 DIAGNOSIS — M5126 Other intervertebral disc displacement, lumbar region: Principal | ICD-10-CM | POA: Diagnosis present

## 2013-08-17 DIAGNOSIS — Z7902 Long term (current) use of antithrombotics/antiplatelets: Secondary | ICD-10-CM | POA: Insufficient documentation

## 2013-08-17 DIAGNOSIS — C001 Malignant neoplasm of external lower lip: Secondary | ICD-10-CM | POA: Insufficient documentation

## 2013-08-17 DIAGNOSIS — F17200 Nicotine dependence, unspecified, uncomplicated: Secondary | ICD-10-CM | POA: Insufficient documentation

## 2013-08-17 DIAGNOSIS — M5116 Intervertebral disc disorders with radiculopathy, lumbar region: Secondary | ICD-10-CM | POA: Diagnosis present

## 2013-08-17 DIAGNOSIS — I10 Essential (primary) hypertension: Secondary | ICD-10-CM | POA: Insufficient documentation

## 2013-08-17 DIAGNOSIS — F411 Generalized anxiety disorder: Secondary | ICD-10-CM | POA: Insufficient documentation

## 2013-08-17 HISTORY — PX: LUMBAR LAMINECTOMY/DECOMPRESSION MICRODISCECTOMY: SHX5026

## 2013-08-17 LAB — GLUCOSE, CAPILLARY
GLUCOSE-CAPILLARY: 246 mg/dL — AB (ref 70–99)
GLUCOSE-CAPILLARY: 315 mg/dL — AB (ref 70–99)
Glucose-Capillary: 200 mg/dL — ABNORMAL HIGH (ref 70–99)
Glucose-Capillary: 192 mg/dL — ABNORMAL HIGH (ref 70–99)
Glucose-Capillary: 306 mg/dL — ABNORMAL HIGH (ref 70–99)

## 2013-08-17 SURGERY — LUMBAR LAMINECTOMY/DECOMPRESSION MICRODISCECTOMY 1 LEVEL
Anesthesia: General | Site: Back | Laterality: Right

## 2013-08-17 MED ORDER — HYDROMORPHONE HCL PF 1 MG/ML IJ SOLN
INTRAMUSCULAR | Status: AC
  Filled 2013-08-17: qty 1

## 2013-08-17 MED ORDER — INSULIN LISPRO 100 UNIT/ML (KWIKPEN): 8.0000 [IU] | PEN_INJECTOR | Freq: Three times a day (TID) | SUBCUTANEOUS | Status: DC

## 2013-08-17 MED ORDER — ONDANSETRON HCL 4 MG/2ML IJ SOLN
INTRAMUSCULAR | Status: DC | PRN
  Administered 2013-08-17: 4 mg via INTRAVENOUS

## 2013-08-17 MED ORDER — LACTATED RINGERS IV SOLN
INTRAVENOUS | Status: DC
  Administered 2013-08-17: 08:00:00 via INTRAVENOUS

## 2013-08-17 MED ORDER — HYDROCODONE-ACETAMINOPHEN 5-325 MG PO TABS
1.0000 | ORAL_TABLET | ORAL | Status: DC | PRN
  Administered 2013-08-18: 2 via ORAL
  Filled 2013-08-17: qty 2

## 2013-08-17 MED ORDER — KETOROLAC TROMETHAMINE 30 MG/ML IJ SOLN
30.0000 mg | Freq: Four times a day (QID) | INTRAMUSCULAR | Status: DC
  Administered 2013-08-17 – 2013-08-18 (×4): 30 mg via INTRAVENOUS
  Filled 2013-08-17 (×7): qty 1

## 2013-08-17 MED ORDER — OXYCODONE-ACETAMINOPHEN 5-325 MG PO TABS
1.0000 | ORAL_TABLET | ORAL | Status: DC | PRN
  Administered 2013-08-17: 2 via ORAL

## 2013-08-17 MED ORDER — MENTHOL 3 MG MT LOZG: 1.0000 | LOZENGE | OROMUCOSAL | Status: DC | PRN

## 2013-08-17 MED ORDER — DEXAMETHASONE SODIUM PHOSPHATE 10 MG/ML IJ SOLN
10.0000 mg | INTRAMUSCULAR | Status: AC
  Administered 2013-08-17: 10 mg via INTRAVENOUS
  Filled 2013-08-17: qty 1

## 2013-08-17 MED ORDER — INSULIN ASPART 100 UNIT/ML ~~LOC~~ SOLN
8.0000 [IU] | Freq: Three times a day (TID) | SUBCUTANEOUS | Status: DC
  Administered 2013-08-17 – 2013-08-18 (×2): 8 [IU] via SUBCUTANEOUS

## 2013-08-17 MED ORDER — METOPROLOL TARTRATE 12.5 MG HALF TABLET
ORAL_TABLET | ORAL | Status: AC
  Filled 2013-08-17: qty 2

## 2013-08-17 MED ORDER — FENTANYL CITRATE 0.05 MG/ML IJ SOLN
INTRAMUSCULAR | Status: DC | PRN
  Administered 2013-08-17 (×2): 50 ug via INTRAVENOUS
  Administered 2013-08-17: 150 ug via INTRAVENOUS

## 2013-08-17 MED ORDER — INSULIN GLARGINE 100 UNIT/ML SOLOSTAR PEN: 30.0000 [IU] | PEN_INJECTOR | Freq: Every day | SUBCUTANEOUS | Status: DC

## 2013-08-17 MED ORDER — ROCURONIUM BROMIDE 100 MG/10ML IV SOLN
INTRAVENOUS | Status: DC | PRN
  Administered 2013-08-17: 10 mg via INTRAVENOUS
  Administered 2013-08-17: 20 mg via INTRAVENOUS
  Administered 2013-08-17: 40 mg via INTRAVENOUS

## 2013-08-17 MED ORDER — SODIUM CHLORIDE 0.9 % IJ SOLN
3.0000 mL | Freq: Two times a day (BID) | INTRAMUSCULAR | Status: DC
  Administered 2013-08-17 (×2): 3 mL via INTRAVENOUS

## 2013-08-17 MED ORDER — ATORVASTATIN CALCIUM 40 MG PO TABS
40.0000 mg | ORAL_TABLET | Freq: Every day | ORAL | Status: DC
  Administered 2013-08-17: 40 mg via ORAL
  Filled 2013-08-17 (×2): qty 1

## 2013-08-17 MED ORDER — MIDAZOLAM HCL 5 MG/5ML IJ SOLN
INTRAMUSCULAR | Status: DC | PRN
  Administered 2013-08-17: 2 mg via INTRAVENOUS

## 2013-08-17 MED ORDER — PROPOFOL 10 MG/ML IV BOLUS
INTRAVENOUS | Status: DC | PRN
  Administered 2013-08-17: 300 mg via INTRAVENOUS

## 2013-08-17 MED ORDER — METAXALONE 800 MG PO TABS
800.0000 mg | ORAL_TABLET | Freq: Two times a day (BID) | ORAL | Status: DC
  Administered 2013-08-17 (×2): 800 mg via ORAL
  Filled 2013-08-17 (×4): qty 1

## 2013-08-17 MED ORDER — DEXTROSE 5 % IV SOLN
3.0000 g | INTRAVENOUS | Status: AC
  Administered 2013-08-17: 3 g via INTRAVENOUS
  Filled 2013-08-17: qty 3000

## 2013-08-17 MED ORDER — HYDROCODONE-ACETAMINOPHEN 5-325 MG PO TABS: 1.0000 | ORAL_TABLET | ORAL | Status: DC | PRN

## 2013-08-17 MED ORDER — ONDANSETRON HCL 4 MG/2ML IJ SOLN: 4.0000 mg | Freq: Once | INTRAMUSCULAR | Status: DC | PRN

## 2013-08-17 MED ORDER — ONDANSETRON HCL 4 MG/2ML IJ SOLN: 4.0000 mg | INTRAMUSCULAR | Status: DC | PRN

## 2013-08-17 MED ORDER — LIDOCAINE HCL (CARDIAC) 20 MG/ML IV SOLN
INTRAVENOUS | Status: AC
  Filled 2013-08-17: qty 5

## 2013-08-17 MED ORDER — FENTANYL CITRATE 0.05 MG/ML IJ SOLN
INTRAMUSCULAR | Status: AC
  Filled 2013-08-17: qty 5

## 2013-08-17 MED ORDER — SENNA 8.6 MG PO TABS
1.0000 | ORAL_TABLET | Freq: Two times a day (BID) | ORAL | Status: DC
  Administered 2013-08-17 (×2): 8.6 mg via ORAL
  Filled 2013-08-17 (×3): qty 1

## 2013-08-17 MED ORDER — SODIUM CHLORIDE 0.9 % IJ SOLN: 3.0000 mL | INTRAMUSCULAR | Status: DC | PRN

## 2013-08-17 MED ORDER — CEFAZOLIN SODIUM 1-5 GM-% IV SOLN
1.0000 g | Freq: Three times a day (TID) | INTRAVENOUS | Status: AC
  Administered 2013-08-17 – 2013-08-18 (×2): 1 g via INTRAVENOUS
  Filled 2013-08-17 (×2): qty 50

## 2013-08-17 MED ORDER — EPHEDRINE SULFATE 50 MG/ML IJ SOLN
INTRAMUSCULAR | Status: AC
  Filled 2013-08-17: qty 1

## 2013-08-17 MED ORDER — NEOSTIGMINE METHYLSULFATE 1 MG/ML IJ SOLN
INTRAMUSCULAR | Status: DC | PRN
  Administered 2013-08-17: 5 mg via INTRAVENOUS

## 2013-08-17 MED ORDER — MIDAZOLAM HCL 2 MG/2ML IJ SOLN
INTRAMUSCULAR | Status: AC
  Filled 2013-08-17: qty 2

## 2013-08-17 MED ORDER — METOPROLOL TARTRATE 25 MG PO TABS
25.0000 mg | ORAL_TABLET | Freq: Every day | ORAL | Status: DC
  Filled 2013-08-17 (×2): qty 1

## 2013-08-17 MED ORDER — THROMBIN 5000 UNITS EX SOLR
CUTANEOUS | Status: DC | PRN
  Administered 2013-08-17 (×2): 5000 [IU] via TOPICAL

## 2013-08-17 MED ORDER — INSULIN GLARGINE 100 UNIT/ML ~~LOC~~ SOLN
30.0000 [IU] | Freq: Every day | SUBCUTANEOUS | Status: DC
  Administered 2013-08-17: 30 [IU] via SUBCUTANEOUS
  Filled 2013-08-17 (×3): qty 0.3

## 2013-08-17 MED ORDER — METOPROLOL TARTRATE 25 MG PO TABS
25.0000 mg | ORAL_TABLET | Freq: Once | ORAL | Status: AC
  Administered 2013-08-17: 25 mg via ORAL

## 2013-08-17 MED ORDER — BACITRACIN 50000 UNITS IM SOLR
INTRAMUSCULAR | Status: DC | PRN
  Administered 2013-08-17: 12:00:00

## 2013-08-17 MED ORDER — HEMOSTATIC AGENTS (NO CHARGE) OPTIME
TOPICAL | Status: DC | PRN
  Administered 2013-08-17: 1 via TOPICAL

## 2013-08-17 MED ORDER — LIDOCAINE HCL (CARDIAC) 20 MG/ML IV SOLN
INTRAVENOUS | Status: DC | PRN
  Administered 2013-08-17: 80 mg via INTRAVENOUS

## 2013-08-17 MED ORDER — CYCLOBENZAPRINE HCL 10 MG PO TABS
10.0000 mg | ORAL_TABLET | Freq: Three times a day (TID) | ORAL | Status: DC | PRN
  Administered 2013-08-18: 10 mg via ORAL
  Filled 2013-08-17: qty 1

## 2013-08-17 MED ORDER — CYCLOBENZAPRINE HCL 10 MG PO TABS: 10.0000 mg | ORAL_TABLET | Freq: Three times a day (TID) | ORAL | Status: DC | PRN

## 2013-08-17 MED ORDER — SUCCINYLCHOLINE CHLORIDE 20 MG/ML IJ SOLN
INTRAMUSCULAR | Status: AC
  Filled 2013-08-17: qty 1

## 2013-08-17 MED ORDER — HYDROMORPHONE HCL PF 1 MG/ML IJ SOLN
0.5000 mg | INTRAMUSCULAR | Status: DC | PRN
  Administered 2013-08-17: 1 mg via INTRAVENOUS
  Filled 2013-08-17: qty 1

## 2013-08-17 MED ORDER — ROCURONIUM BROMIDE 50 MG/5ML IV SOLN
INTRAVENOUS | Status: AC
  Filled 2013-08-17: qty 1

## 2013-08-17 MED ORDER — HYDROMORPHONE HCL PF 1 MG/ML IJ SOLN
0.2500 mg | INTRAMUSCULAR | Status: DC | PRN
  Administered 2013-08-17 (×4): 0.5 mg via INTRAVENOUS

## 2013-08-17 MED ORDER — PROPOFOL 10 MG/ML IV BOLUS
INTRAVENOUS | Status: AC
Start: 2013-08-17 — End: 2013-08-17
  Filled 2013-08-17: qty 20

## 2013-08-17 MED ORDER — ONDANSETRON HCL 4 MG/2ML IJ SOLN
INTRAMUSCULAR | Status: AC
  Filled 2013-08-17: qty 2

## 2013-08-17 MED ORDER — GLYCOPYRROLATE 0.2 MG/ML IJ SOLN
INTRAMUSCULAR | Status: DC | PRN
  Administered 2013-08-17: .8 mg via INTRAVENOUS

## 2013-08-17 MED ORDER — PHENYLEPHRINE 40 MCG/ML (10ML) SYRINGE FOR IV PUSH (FOR BLOOD PRESSURE SUPPORT)
PREFILLED_SYRINGE | INTRAVENOUS | Status: AC
  Filled 2013-08-17: qty 10

## 2013-08-17 MED ORDER — PHENOL 1.4 % MT LIQD: 1.0000 | OROMUCOSAL | Status: DC | PRN

## 2013-08-17 MED ORDER — LACTATED RINGERS IV SOLN
INTRAVENOUS | Status: DC | PRN
  Administered 2013-08-17 (×2): via INTRAVENOUS

## 2013-08-17 MED ORDER — BUPIVACAINE HCL (PF) 0.25 % IJ SOLN
INTRAMUSCULAR | Status: DC | PRN
  Administered 2013-08-17: 20 mL

## 2013-08-17 MED ORDER — SODIUM CHLORIDE 0.9 % IJ SOLN
INTRAMUSCULAR | Status: AC
  Filled 2013-08-17: qty 10

## 2013-08-17 MED ORDER — ACETAMINOPHEN 650 MG RE SUPP: 650.0000 mg | RECTAL | Status: DC | PRN

## 2013-08-17 MED ORDER — ACETAMINOPHEN 325 MG PO TABS: 650.0000 mg | ORAL_TABLET | ORAL | Status: DC | PRN

## 2013-08-17 MED ORDER — KETOROLAC TROMETHAMINE 30 MG/ML IJ SOLN
INTRAMUSCULAR | Status: AC
  Filled 2013-08-17: qty 1

## 2013-08-17 MED ORDER — ALUM & MAG HYDROXIDE-SIMETH 200-200-20 MG/5ML PO SUSP: 30.0000 mL | Freq: Four times a day (QID) | ORAL | Status: DC | PRN

## 2013-08-17 MED ORDER — PROPOFOL 10 MG/ML IV BOLUS
INTRAVENOUS | Status: AC
  Filled 2013-08-17: qty 20

## 2013-08-17 MED ORDER — HYDROMORPHONE HCL 2 MG PO TABS
2.0000 mg | ORAL_TABLET | ORAL | Status: DC | PRN
  Administered 2013-08-17: 2 mg via ORAL
  Filled 2013-08-17: qty 1

## 2013-08-17 MED ORDER — OXYCODONE-ACETAMINOPHEN 5-325 MG PO TABS
ORAL_TABLET | ORAL | Status: AC
  Filled 2013-08-17: qty 2

## 2013-08-17 SURGICAL SUPPLY — 56 items
BAG DECANTER FOR FLEXI CONT (MISCELLANEOUS) ×2 IMPLANT
BENZOIN TINCTURE PRP APPL 2/3 (GAUZE/BANDAGES/DRESSINGS) ×2 IMPLANT
BLADE SURG ROTATE 9660 (MISCELLANEOUS) IMPLANT
BRUSH SCRUB EZ PLAIN DRY (MISCELLANEOUS) ×2 IMPLANT
BUR CUTTER 7.0 ROUND (BURR) ×2 IMPLANT
CANISTER SUCT 3000ML (MISCELLANEOUS) ×2 IMPLANT
CONT SPEC 4OZ CLIKSEAL STRL BL (MISCELLANEOUS) ×2 IMPLANT
DECANTER SPIKE VIAL GLASS SM (MISCELLANEOUS) ×2 IMPLANT
DERMABOND ADHESIVE PROPEN (GAUZE/BANDAGES/DRESSINGS) ×1
DERMABOND ADVANCED (GAUZE/BANDAGES/DRESSINGS) ×1
DERMABOND ADVANCED .7 DNX12 (GAUZE/BANDAGES/DRESSINGS) ×1 IMPLANT
DERMABOND ADVANCED .7 DNX6 (GAUZE/BANDAGES/DRESSINGS) ×1 IMPLANT
DRAPE LAPAROTOMY 100X72X124 (DRAPES) ×2 IMPLANT
DRAPE MICROSCOPE ZEISS OPMI (DRAPES) ×2 IMPLANT
DRAPE POUCH INSTRU U-SHP 10X18 (DRAPES) ×2 IMPLANT
DRAPE PROXIMA HALF (DRAPES) IMPLANT
DRAPE SURG 17X23 STRL (DRAPES) ×4 IMPLANT
DRSG OPSITE POSTOP 3X4 (GAUZE/BANDAGES/DRESSINGS) ×2 IMPLANT
DRSG OPSITE POSTOP 4X6 (GAUZE/BANDAGES/DRESSINGS) ×2 IMPLANT
DURAPREP 26ML APPLICATOR (WOUND CARE) ×2 IMPLANT
ELECT REM PT RETURN 9FT ADLT (ELECTROSURGICAL) ×2
ELECTRODE REM PT RTRN 9FT ADLT (ELECTROSURGICAL) ×1 IMPLANT
GAUZE SPONGE 4X4 16PLY XRAY LF (GAUZE/BANDAGES/DRESSINGS) IMPLANT
GLOVE BIOGEL PI IND STRL 7.5 (GLOVE) ×2 IMPLANT
GLOVE BIOGEL PI INDICATOR 7.5 (GLOVE) ×2
GLOVE ECLIPSE 7.0 STRL STRAW (GLOVE) ×4 IMPLANT
GLOVE ECLIPSE 8.5 STRL (GLOVE) ×2 IMPLANT
GLOVE EXAM NITRILE LRG STRL (GLOVE) ×4 IMPLANT
GLOVE EXAM NITRILE MD LF STRL (GLOVE) IMPLANT
GLOVE EXAM NITRILE XL STR (GLOVE) IMPLANT
GLOVE EXAM NITRILE XS STR PU (GLOVE) IMPLANT
GLOVE SURG SS PI 7.0 STRL IVOR (GLOVE) ×4 IMPLANT
GOWN BRE IMP SLV AUR LG STRL (GOWN DISPOSABLE) IMPLANT
GOWN BRE IMP SLV AUR XL STRL (GOWN DISPOSABLE) ×2 IMPLANT
GOWN STRL REIN 2XL LVL4 (GOWN DISPOSABLE) IMPLANT
GOWN STRL REUS W/ TWL LRG LVL3 (GOWN DISPOSABLE) ×2 IMPLANT
GOWN STRL REUS W/ TWL XL LVL3 (GOWN DISPOSABLE) ×1 IMPLANT
GOWN STRL REUS W/TWL LRG LVL3 (GOWN DISPOSABLE) ×2
GOWN STRL REUS W/TWL XL LVL3 (GOWN DISPOSABLE) ×1
KIT BASIN OR (CUSTOM PROCEDURE TRAY) ×2 IMPLANT
KIT ROOM TURNOVER OR (KITS) ×2 IMPLANT
NEEDLE HYPO 22GX1.5 SAFETY (NEEDLE) ×2 IMPLANT
NEEDLE SPNL 22GX3.5 QUINCKE BK (NEEDLE) ×2 IMPLANT
NS IRRIG 1000ML POUR BTL (IV SOLUTION) ×2 IMPLANT
PACK LAMINECTOMY NEURO (CUSTOM PROCEDURE TRAY) ×2 IMPLANT
PAD ARMBOARD 7.5X6 YLW CONV (MISCELLANEOUS) ×6 IMPLANT
RUBBERBAND STERILE (MISCELLANEOUS) ×4 IMPLANT
SPONGE GAUZE 4X4 12PLY (GAUZE/BANDAGES/DRESSINGS) ×2 IMPLANT
SPONGE SURGIFOAM ABS GEL SZ50 (HEMOSTASIS) ×2 IMPLANT
STRIP CLOSURE SKIN 1/2X4 (GAUZE/BANDAGES/DRESSINGS) ×2 IMPLANT
SUT VIC AB 2-0 CT1 18 (SUTURE) ×2 IMPLANT
SUT VIC AB 3-0 SH 8-18 (SUTURE) ×2 IMPLANT
SYR 20ML ECCENTRIC (SYRINGE) ×2 IMPLANT
TOWEL OR 17X24 6PK STRL BLUE (TOWEL DISPOSABLE) ×2 IMPLANT
TOWEL OR 17X26 10 PK STRL BLUE (TOWEL DISPOSABLE) ×2 IMPLANT
WATER STERILE IRR 1000ML POUR (IV SOLUTION) ×2 IMPLANT

## 2013-08-17 NOTE — Discharge Instructions (Signed)

## 2013-08-17 NOTE — Progress Notes (Signed)
Utilization review completed.  

## 2013-08-17 NOTE — H&P (Signed)
George Dickson is an 43 y.o. male.   Chief Complaint: Back and right leg pain HPI: 43 year old male with severe back and right leg pain. Symptoms began suddenly and had been in intractable. Patient has required hospitalization for pain control. Workup demonstrates evidence of a very large right-sided L4-5 disc herniation with inferior Maffett fragment causing marked compression of the thecal sac and right L5 nerve root. Patient was recently discharged from the hospital after newly diagnosed diabetes mellitus. He felt to be medically stable for the surgery.  Past Medical History  Diagnosis Date  . Stented coronary artery   . Hypertension   . Coronary artery disease JUNE 2013    inferior ST-segment  elevation  MI- DES stent -proximaL RCA  . OSA (obstructive sleep apnea) 12/06/2011    initial sleep study  completed, CPAP /BiPAP TITRATION DONE 12/20/11  . Dyslipidemia   . Pneumonia   . Anxiety   . Kidney stones   . GERD (gastroesophageal reflux disease)     occasionally takes TUMS  . Headache(784.0)   . Old myocardial infarction   . Type II diabetes mellitus     "dx'd today" (08/13/2013)    Past Surgical History  Procedure Laterality Date  . Cardiac catheterization  JUNE 1,2013    thrombectomy of 100% thrombotic  occlusion of  proximal RCA  . Coronary angioplasty  10/23/2011    PCI - proximal  RCA with  Promus ElEMENT  des 3.0 mm x 2mm ; final diameter 3.10mm distal, 3.35 mm proximal   . Doppler echocardiography  10/25/2011    EF 93-81%;WE diastolic function normal ,systolic function normal  . Shoulder arthroscopy Right 1992    Family History  Problem Relation Age of Onset  . Coronary artery disease Mother   . Hypertension Mother   . Diabetes type II Mother    Social History:  reports that he has been smoking Cigars and Cigarettes.  He has a 6 pack-year smoking history. He has never used smokeless tobacco. He reports that he drinks about 14.4 ounces of alcohol per week. He reports that he  does not use illicit drugs.  Allergies: No Known Allergies  Medications Prior to Admission  Medication Sig Dispense Refill  . atorvastatin (LIPITOR) 40 MG tablet Take 40 mg by mouth daily.      . clopidogrel (PLAVIX) 75 MG tablet Take 75 mg by mouth daily.      Marland Kitchen docusate sodium 100 MG CAPS Take 100 mg by mouth 2 (two) times daily.  10 capsule  0  . HYDROcodone-acetaminophen (NORCO/VICODIN) 5-325 MG per tablet Take 1-2 tablets by mouth every 6 (six) hours as needed for moderate pain.      Marland Kitchen HYDROmorphone (DILAUDID) 2 MG tablet Take 2 mg by mouth every 4 (four) hours as needed for severe pain (may take 1 to 2 tabs every 4 hours as needed).      Marland Kitchen ibuprofen (ADVIL,MOTRIN) 200 MG tablet Take 800 mg by mouth every 6 (six) hours as needed for mild pain.       . Insulin Glargine (LANTUS) 100 UNIT/ML Solostar Pen Inject 30 Units into the skin daily.  15 mL  11  . insulin lispro (HUMALOG) 100 UNIT/ML KiwkPen Inject 8 Units into the skin 3 (three) times daily with meals.  15 mL  11  . metaxalone (SKELAXIN) 800 MG tablet Take 800 mg by mouth 2 (two) times daily.      . metoprolol tartrate (LOPRESSOR) 25 MG tablet Take 25  mg by mouth daily.      Marland Kitchen NEEDLE, DISP, 30 G (B-D DISP NEEDLE 30GX1") 30G X 1" MISC 1 Units by Does not apply route 4 (four) times daily -  before meals and at bedtime.  1 each  12    Results for orders placed during the hospital encounter of 08/17/13 (from the past 48 hour(s))  GLUCOSE, CAPILLARY     Status: Abnormal   Collection Time    08/17/13  8:04 AM      Result Value Ref Range   Glucose-Capillary 246 (*) 70 - 99 mg/dL   No results found.  Pertinent items are noted in HPI.  Blood pressure 138/87, pulse 84, temperature 97.1 F (36.2 C), temperature source Oral, resp. rate 20, height 6\' 3"  (1.905 m), weight 133.358 kg (294 lb), SpO2 97.00%.  Awake and alert. Oriented and appropriate. Patient obvious any great deal pain. Motor examination reveals significant weakness in his  right-sided anterior condyle this and extensor hallucis longus crating at 3 of her 5. Remainder of motor strength intact. Sensory examination was decreased sensation over light touch in his right L5 and S1 dermatomes. Deep versus a normal active sepsis Achilles reflexes are absent bilaterally. Straight leg raising is strongly positive. Patient unable to stand and walk secondary to pain. Examination head ears eyes and there is unremarkable. Chest and abdomen benign. Extremities Branden from injury or deformity. Assessment/Plan Right L4-5 herniated nucleus pulposus with radiculopathy. Plan right L4-5 laminotomy and microdiscectomy. Risks and benefits been explained. Patient wishes to proceed.  Shaneal Barasch A 08/17/2013, 10:12 AM

## 2013-08-17 NOTE — Progress Notes (Signed)
Report received from [facility]

## 2013-08-17 NOTE — Discharge Summary (Signed)
Physician Discharge Summary  Patient ID: George Dickson MRN: [id number] DOB/AGE: 1971-03-28 43 y.o.  Admit date: 08/17/2013 Discharge date: 08/17/2013  Admission Diagnoses:  Discharge Diagnoses:  Principal Problem:   HNP (herniated nucleus pulposus), lumbar Active Problems:   Lumbar disc herniation with radiculopathy   Discharged Condition: good  Hospital Course: Patient admitted the hospital where and when uncomplicated C5-8 laminotomy and microdiscectomy. Postoperatively is done quite well peer back and leg pain very much improved. He's up ambulating and ready for discharge home.  Consults:   Significant Diagnostic Studies:   Treatments:   Discharge Exam: Blood pressure 115/78, pulse 93, temperature 97.6 F (36.4 C), temperature source Oral, resp. rate 18, height 6\' 3"  (1.905 m), weight 133.358 kg (294 lb), SpO2 95.00%. Awake and alert. Oriented and appropriate. Cranial nerve function intact. Motor and sensory function extremities normal. Wound clean and dry. Chest and abdomen benign.  Disposition: 01-Home or Self Care   Future Appointments Provider Department Dept Phone   10/08/2013 9:15 AM Anne Shutter, [place] [phone]       Medication List         atorvastatin 40 MG tablet  Commonly known as:  LIPITOR  Take 40 mg by mouth daily.     clopidogrel 75 MG tablet  Commonly known as:  PLAVIX  Take 75 mg by mouth daily.     cyclobenzaprine 10 MG tablet  Commonly known as:  FLEXERIL  Take 1 tablet (10 mg total) by mouth 3 (three) times daily as needed for muscle spasms.     DSS 100 MG Caps  Take 100 mg by mouth 2 (two) times daily.     HYDROcodone-acetaminophen 5-325 MG per tablet  Commonly known as:  NORCO/VICODIN  Take 1-2 tablets by mouth every 6 (six) hours as needed for moderate pain.     HYDROcodone-acetaminophen 5-325 MG per tablet  Commonly known as:  NORCO/VICODIN  Take 1-2 tablets by mouth every  4 (four) hours as needed for moderate pain.     HYDROmorphone 2 MG tablet  Commonly known as:  DILAUDID  Take 2 mg by mouth every 4 (four) hours as needed for severe pain (may take 1 to 2 tabs every 4 hours as needed).     ibuprofen 200 MG tablet  Commonly known as:  ADVIL,MOTRIN  Take 800 mg by mouth every 6 (six) hours as needed for mild pain.     Insulin Glargine 100 UNIT/ML Solostar Pen  Commonly known as:  LANTUS  Inject 30 Units into the skin daily.     insulin lispro 100 UNIT/ML KiwkPen  Commonly known as:  HUMALOG  Inject 8 Units into the skin 3 (three) times daily with meals.     metaxalone 800 MG tablet  Commonly known as:  SKELAXIN  Take 800 mg by mouth 2 (two) times daily.     metoprolol tartrate 25 MG tablet  Commonly known as:  LOPRESSOR  Take 25 mg by mouth daily.     NEEDLE (DISP) 30 G 30G X 1" Misc  Commonly known as:  B-D DISP NEEDLE 30GX1"  1 Units by Does not apply route 4 (four) times daily -  before meals and at bedtime.           Follow-up Information   Schedule an appointment as soon as possible for a visit with Charlie Pitter, MD.   Specialty:  Neurosurgery   Contact information:   [address]  [address] [place] [address] [phone]       Signed: Earnie Larsson A 08/17/2013, 5:26 PM

## 2013-08-17 NOTE — Anesthesia Procedure Notes (Signed)
Procedure Name: Intubation Date/Time: 08/17/2013 10:48 AM Performed by: Carney Living Pre-anesthesia Checklist: Patient identified, Emergency Drugs available, Suction available, Patient being monitored and Timeout performed Patient Re-evaluated:Patient Re-evaluated prior to inductionOxygen Delivery Method: Circle system utilized Preoxygenation: Pre-oxygenation with 100% oxygen Intubation Type: IV induction Ventilation: Mask ventilation without difficulty Laryngoscope Size: Mac and 4 Grade View: Grade II Tube type: Oral Tube size: 7.5 mm Number of attempts: 1 Airway Equipment and Method: Stylet Placement Confirmation: ETT inserted through vocal cords under direct vision,  positive ETCO2 and breath sounds checked- equal and bilateral Secured at: 23 cm Tube secured with: Tape Dental Injury: Teeth and Oropharynx as per pre-operative assessment

## 2013-08-17 NOTE — Anesthesia Postprocedure Evaluation (Signed)
  Anesthesia Post-op Note  Patient: George Dickson  Procedure(s) Performed: Procedure(s): LUMBAR FOUR TO FIVE LUMBAR LAMINECTOMY/DECOMPRESSION MICRODISCECTOMY 1 LEVEL (Right)  Patient Location: PACU  Anesthesia Type:General  Level of Consciousness: awake, alert , oriented and patient cooperative  Airway and Oxygen Therapy: Patient Spontanous Breathing  Post-op Pain: mild  Post-op Assessment: Post-op Vital signs reviewed, Patient's Cardiovascular Status Stable, Respiratory Function Stable, Patent Airway, No signs of Nausea or vomiting and Pain level controlled  Post-op Vital Signs: stable  Complications: No apparent anesthesia complications

## 2013-08-17 NOTE — Anesthesia Preprocedure Evaluation (Addendum)
Anesthesia Evaluation  Patient identified by MRN, date of birth, ID band Patient awake    Reviewed: Allergy & Precautions, H&P , NPO status , Patient's Chart, lab work & pertinent test results  Airway Mallampati: II TM Distance: >3 FB Neck ROM: Full    Dental  (+) Teeth Intact, Dental Advisory Given, Chipped   Pulmonary sleep apnea , Current Smoker,          Cardiovascular hypertension, Pt. on medications and Pt. on home beta blockers + CAD, + Past MI and + Cardiac Stents     Neuro/Psych  Headaches, Anxiety    GI/Hepatic GERD-  Medicated and Controlled,  Endo/Other  diabetes, Type 2, Insulin DependentMorbid obesity  Renal/GU Renal disease     Musculoskeletal   Abdominal   Peds  Hematology   Anesthesia Other Findings   Reproductive/Obstetrics                        Anesthesia Physical Anesthesia Plan  ASA: III  Anesthesia Plan: General   Post-op Pain Management:    Induction: Intravenous  Airway Management Planned: Oral ETT  Additional Equipment:   Intra-op Plan:   Post-operative Plan: Extubation in OR  Informed Consent: I have reviewed the patients History and Physical, chart, labs and discussed the procedure including the risks, benefits and alternatives for the proposed anesthesia with the patient or authorized representative who has indicated his/her understanding and acceptance.   Dental advisory given  Plan Discussed with: CRNA, Anesthesiologist and Surgeon  Anesthesia Plan Comments:        Anesthesia Quick Evaluation

## 2013-08-17 NOTE — Transfer of Care (Signed)
Immediate Anesthesia Transfer of Care Note  Patient: George Dickson  Procedure(s) Performed: Procedure(s): LUMBAR FOUR TO FIVE LUMBAR LAMINECTOMY/DECOMPRESSION MICRODISCECTOMY 1 LEVEL (Right)  Patient Location: PACU  Anesthesia Type:General  Level of Consciousness: awake, alert , oriented and patient cooperative  Airway & Oxygen Therapy: Patient Spontanous Breathing and Patient connected to nasal cannula oxygen  Post-op Assessment: Report given to PACU RN, Post -op Vital signs reviewed and stable and Patient moving all extremities X 4  Post vital signs: Reviewed and stable  Complications: No apparent anesthesia complications

## 2013-08-17 NOTE — Plan of Care (Signed)
Problem: Consults Goal: Diagnosis - Spinal Surgery Outcome: Completed/Met Date Met:  08/17/13 Lumbar Laminectomy (Complex)

## 2013-08-17 NOTE — Op Note (Signed)
Date of procedure: 08/17/2013  Date of dictation: Same  Service: Neurosurgery  Preoperative diagnosis: Right L4-5 herniated nucleosis pulposus with radiculopathy  Postoperative diagnosis: Same  Procedure Name: Right L4-5 laminotomy microdiscectomy  Surgeon:Doyce Saling A.Dajuan Turnley, M.D.  Asst. Surgeon: Kathyrn Sheriff  Anesthesia: General  Indication: 43 year old male with severe back and right lower extremity pain. Workup demonstrates evidence of very large right-sided L4-5 disc herniation with marked compression of the right-sided L5 nerve root. Patient presents now for laminotomy and microdiscectomy in hopes of improving his symptoms.  Operative note: After induction anesthesia, patient positioned prone onto Wilson frame and appropriately padded. Lumbar region prepped and draped. Incision made overlying L4-5. Subperiosteal dissection performed the right side. Retractor placed. X-ray taken. Level confirmed. Laminotomy performed using high-speed drill and Kerrison rongeurs to remove the inferior aspect of lamina of L4 medial aspect the L4-5 facet joint and the superior rim of the L5 lamina. Ligament flavum was elevated and resected piecemeal fashion using Kerrison rongeurs. Underlying thecal sac and right L5 nerve root were identified. Microscope was then brought into the field and used for microdissection of the spinal canal. Epidural venous plexus was coagulated and cut. Thecal sac and L5 nerve root was gently mobilized. Inferior Sowles fragment was dissected Brisby and removed in 3 very large pieces. All elements the disc herniation appeared and resected. The space at the conclusion of removing the fragment was quite flat and I do not see the need for cutting into the annulus. At this point the wound is irrigated out like solution. Gelfoam was placed topically for hemostasis which then the beaded. Microscope and retractor system were removed. Hemostasis of the muscle was achieved left Harvard was and close in  layers with Vicryl sutures. Steri-Strips and sterile dressing were applied. There were no apparent complications. Patient tolerated the procedure well and he returns to the recovery room postop.

## 2013-08-17 NOTE — Brief Op Note (Signed)
08/17/2013  11:51 AM  PATIENT:  Nathanial Millman [place]  43 y.o. male  PRE-OPERATIVE DIAGNOSIS:  Herniated Nucleus Pulposus  POST-OPERATIVE DIAGNOSIS:  Herniated Nucleus Pulposus  PROCEDURE:  Procedure(s): LUMBAR FOUR TO FIVE LUMBAR LAMINECTOMY/DECOMPRESSION MICRODISCECTOMY 1 LEVEL (Right)  SURGEON:  Surgeon(s) and Role:    * Charlie Pitter, MD - Primary    * Consuella Lose, MD - Assisting  PHYSICIAN ASSISTANT:   ASSISTANTS:    ANESTHESIA:   general  EBL:  Total I/O In: 1000 [I.V.:1000] Out: 150 [Blood:150]  BLOOD ADMINISTERED:none  DRAINS: none   LOCAL MEDICATIONS USED:  MARCAINE     SPECIMEN:  No Specimen  DISPOSITION OF SPECIMEN:  N/A  COUNTS:  YES  TOURNIQUET:  * No tourniquets in log *  DICTATION: .Dragon Dictation  PLAN OF CARE: Admit for overnight observation  PATIENT DISPOSITION:  PACU - hemodynamically stable.   Delay start of Pharmacological VTE agent (>24hrs) due to surgical blood loss or risk of bleeding: yes

## 2013-08-17 NOTE — Preoperative (Signed)
Beta Blockers   Reason not to administer Beta Blockers:Not Applicable, Pt took metoprolol at 0821 this am

## 2013-08-18 LAB — GLUCOSE, CAPILLARY: Glucose-Capillary: 366 mg/dL — ABNORMAL HIGH (ref 70–99)

## 2013-08-18 NOTE — Progress Notes (Signed)
Pt. Alert and oriented,follows simple instructions, denies pain. Incision area without swelling, redness or S/S of infection. Voiding adequate clear yellow urine. Moving all extremities well and vitals stable and documented. Patient discharged home with mother. Lumbar surgery notes instructions given to patient and family member for home safety and precautions. Pt. and family stated understanding of instructions given. 

## 2013-08-21 ENCOUNTER — Encounter (HOSPITAL_COMMUNITY): Payer: Self-pay | Admitting: Neurosurgery

## 2013-10-08 ENCOUNTER — Encounter: Payer: Self-pay | Admitting: *Deleted

## 2013-10-08 ENCOUNTER — Encounter: Payer: 59 | Attending: Family Medicine | Admitting: *Deleted

## 2013-10-08 VITALS — Ht 74.0 in | Wt 289.2 lb

## 2013-10-08 DIAGNOSIS — E119 Type 2 diabetes mellitus without complications: Secondary | ICD-10-CM | POA: Insufficient documentation

## 2013-10-08 DIAGNOSIS — Z794 Long term (current) use of insulin: Secondary | ICD-10-CM | POA: Insufficient documentation

## 2013-10-08 DIAGNOSIS — Z713 Dietary counseling and surveillance: Secondary | ICD-10-CM | POA: Insufficient documentation

## 2013-10-08 NOTE — Progress Notes (Signed)
Appt start time: 0930 end time:  1100.  Assessment:  Patient was seen on  10/08/13 for individual diabetes education. Newly diagnosed with DM 2 about 2 months ago in hospital. Lives alone, so he buys and cooks his own food and occasionally eats at his mother's house. He typically works in [employer] 12 hours a day from 7 AM to 7 PM 3-4  days a week. He SMBG once a day at bedtime. He is getting physical therapy post back surgery twice a week for about an hour. He used to ride a bike and walk regularly and he is trying to walk a mile when he is able. He is now only on Lantus insulin, no longer needs the Humalog before meals. He has quit all beverages including alcohol for the past 4 months, except water.  Current HbA1c: 12.9%  Preferred Learning Style:   No preference indicated   Learning Readiness:   Ready  Change in progress  MEDICATIONS: see list, diabetes medication is Lantus @ 45 units at bedtime  DIETARY INTAKE:  24-hr recall:  B ( AM): skips usually  Snk ( AM): no  L ( PM): skips usually, maybe snack foods, variety of fruits, cereal, sandwiches etc. No set pattern Snk ( PM): no D (8 PM): vegetables, occasionally some meat, depends on what he feels like cooking Snk ( PM): no Beverages: water,   Usual physical activity: walking as able after back surgery  Estimated energy needs: 2000 calories 225 g carbohydrates 150 g protein 56 g fat  Progress Towards Goal(s):  In progress.   Nutritional Diagnosis:  NB-1.1 Food and nutrition-related knowledge deficit as related to managing diabetes with A1c of 12.9%     Intervention:  Nutrition counseling provided.  Discussed diabetes disease process and treatment options.  Discussed physiology of diabetes and role of obesity on insulin resistance.  Encouraged moderate weight reduction to improve glucose levels.  Discussed role of medications and diet in glucose control  Provided education on macronutrients on glucose levels.   Provided education on carb counting, importance of regularly scheduled meals/snacks, and meal planning  Discussed effects of physical activity on glucose levels and long-term glucose control.  Recommended current activity of walking as his physical activity/week.  Reviewed patient medications.  Discussed role of medication on blood glucose and possible side effects. Discussed importance of notifying MD is BG is in the 80's for 2 or more days so insulin dose can be adjusted as needed.  Discussed blood glucose monitoring and interpretation.  Discussed recommended target ranges and individual ranges.    Described short-term complications: hyper- and hypo-glycemia.  Discussed causes,symptoms, and treatment options.  Discussed prevention, detection, and treatment of long-term complications.    Discussed role of stress on blood glucose levels and discussed strategies to manage psychosocial issues.  Discussed recommendations for long-term diabetes self-care.  Provided checklist for medical, dental, and emotional self-care.  Plan:  Aim for 4 Carb Choices per meal (60 grams) +/- 1 either way  Aim for 0-2 Carbs per snack if hungry  Include protein in moderation with your meals and snacks Consider reading food labels for Total Carbohydrate of foods Continue with your activity level as tolerated Consider checking BG at alternate times per day   Continue taking Lantus insulin at 10 PM as directed by MD  Teaching Method Utilized: Visual, Auditory and Hands on  Handouts given during visit include: Living Well with Diabetes Carb Counting and Food Label handouts Meal Plan Card Insulin Action handout  Snack Handout  Barriers to learning/adherence to lifestyle change: recovering from surgery and new diagnosis of chronic disease  Diabetes self-care support plan:   [facility] support group  Demonstrated degree of understanding via:  Teach Back   Monitoring/Evaluation:  Dietary intake, exercise, reading  food labels, SMBG, and body weight in 1- 2 months.

## 2013-10-08 NOTE — Patient Instructions (Signed)
Plan:  Aim for 4 Carb Choices per meal (60 grams) +/- 1 either way  Aim for 0-2 Carbs per snack if hungry  Include protein in moderation with your meals and snacks Consider reading food labels for Total Carbohydrate of foods Continue with your activity level as tolerated Consider checking BG at alternate times per day   Continue taking Lantus insulin at 10 PM as directed by MD

## 2013-11-08 ENCOUNTER — Other Ambulatory Visit: Payer: Self-pay | Admitting: Neurosurgery

## 2013-11-08 DIAGNOSIS — M5126 Other intervertebral disc displacement, lumbar region: Secondary | ICD-10-CM

## 2013-11-16 ENCOUNTER — Telehealth: Payer: Self-pay | Admitting: Cardiology

## 2013-11-16 NOTE — Telephone Encounter (Signed)
Office notified via fax that patient was a No Show for his appointment on 08/13/13 for surgical clearance.

## 2013-11-20 ENCOUNTER — Ambulatory Visit
Admission: RE | Admit: 2013-11-20 | Discharge: 2013-11-20 | Disposition: A | Discharge status: Home / Self Care | Payer: 59 | Source: Ambulatory Visit | Attending: Neurosurgery | Admitting: Neurosurgery

## 2013-11-20 DIAGNOSIS — M5126 Other intervertebral disc displacement, lumbar region: Secondary | ICD-10-CM

## 2013-12-04 ENCOUNTER — Ambulatory Visit: Payer: 59 | Admitting: *Deleted

## 2013-12-27 ENCOUNTER — Other Ambulatory Visit (HOSPITAL_COMMUNITY): Payer: Self-pay | Admitting: Neurosurgery

## 2013-12-27 DIAGNOSIS — M5126 Other intervertebral disc displacement, lumbar region: Secondary | ICD-10-CM

## 2014-05-02 ENCOUNTER — Encounter (HOSPITAL_COMMUNITY): Payer: Self-pay | Admitting: Cardiology

## 2015-03-30 ENCOUNTER — Emergency Department (HOSPITAL_COMMUNITY): Payer: BLUE CROSS/BLUE SHIELD

## 2015-03-30 ENCOUNTER — Emergency Department (HOSPITAL_COMMUNITY)
Admission: EM | Admit: 2015-03-30 | Discharge: 2015-03-30 | Disposition: A | Discharge status: Home / Self Care | Payer: BLUE CROSS/BLUE SHIELD | Attending: Emergency Medicine | Admitting: Emergency Medicine

## 2015-03-30 ENCOUNTER — Encounter (HOSPITAL_COMMUNITY): Payer: Self-pay | Admitting: *Deleted

## 2015-03-30 DIAGNOSIS — E119 Type 2 diabetes mellitus without complications: Secondary | ICD-10-CM | POA: Diagnosis not present

## 2015-03-30 DIAGNOSIS — I252 Old myocardial infarction: Secondary | ICD-10-CM | POA: Insufficient documentation

## 2015-03-30 DIAGNOSIS — Z794 Long term (current) use of insulin: Secondary | ICD-10-CM | POA: Diagnosis not present

## 2015-03-30 DIAGNOSIS — I10 Essential (primary) hypertension: Secondary | ICD-10-CM | POA: Diagnosis not present

## 2015-03-30 DIAGNOSIS — E785 Hyperlipidemia, unspecified: Secondary | ICD-10-CM | POA: Insufficient documentation

## 2015-03-30 DIAGNOSIS — Z720 Tobacco use: Secondary | ICD-10-CM | POA: Diagnosis not present

## 2015-03-30 DIAGNOSIS — I2510 Atherosclerotic heart disease of native coronary artery without angina pectoris: Secondary | ICD-10-CM | POA: Diagnosis not present

## 2015-03-30 DIAGNOSIS — R1030 Lower abdominal pain, unspecified: Secondary | ICD-10-CM

## 2015-03-30 DIAGNOSIS — K5900 Constipation, unspecified: Secondary | ICD-10-CM | POA: Diagnosis not present

## 2015-03-30 DIAGNOSIS — Z8701 Personal history of pneumonia (recurrent): Secondary | ICD-10-CM | POA: Insufficient documentation

## 2015-03-30 DIAGNOSIS — N419 Inflammatory disease of prostate, unspecified: Secondary | ICD-10-CM

## 2015-03-30 DIAGNOSIS — Z7902 Long term (current) use of antithrombotics/antiplatelets: Secondary | ICD-10-CM | POA: Insufficient documentation

## 2015-03-30 DIAGNOSIS — Z79899 Other long term (current) drug therapy: Secondary | ICD-10-CM | POA: Insufficient documentation

## 2015-03-30 DIAGNOSIS — N200 Calculus of kidney: Secondary | ICD-10-CM | POA: Diagnosis not present

## 2015-03-30 DIAGNOSIS — Z955 Presence of coronary angioplasty implant and graft: Secondary | ICD-10-CM | POA: Insufficient documentation

## 2015-03-30 DIAGNOSIS — Z8719 Personal history of other diseases of the digestive system: Secondary | ICD-10-CM | POA: Diagnosis not present

## 2015-03-30 LAB — URINALYSIS, ROUTINE W REFLEX MICROSCOPIC
BILIRUBIN URINE: NEGATIVE
GLUCOSE, UA: NEGATIVE mg/dL
HGB URINE DIPSTICK: NEGATIVE
KETONES UR: NEGATIVE mg/dL
Leukocytes, UA: NEGATIVE
Nitrite: NEGATIVE
PH: 6 (ref 5.0–8.0)
PROTEIN: NEGATIVE mg/dL
Specific Gravity, Urine: 1.022 (ref 1.005–1.030)
Urobilinogen, UA: 0.2 mg/dL (ref 0.0–1.0)

## 2015-03-30 LAB — POC OCCULT BLOOD, ED: Fecal Occult Bld: NEGATIVE

## 2015-03-30 MED ORDER — DOXYCYCLINE HYCLATE 100 MG PO CAPS: 100.0000 mg | ORAL_CAPSULE | Freq: Two times a day (BID) | ORAL | Status: DC

## 2015-03-30 MED ORDER — POLYETHYLENE GLYCOL 3350 17 G PO PACK: 17.0000 g | PACK | Freq: Every day | ORAL | Status: DC

## 2015-03-30 MED ORDER — CEFTRIAXONE SODIUM 250 MG IJ SOLR
250.0000 mg | Freq: Once | INTRAMUSCULAR | Status: AC
  Administered 2015-03-30: 250 mg via INTRAMUSCULAR
  Filled 2015-03-30: qty 250

## 2015-03-30 MED ORDER — STERILE WATER FOR INJECTION IJ SOLN
INTRAMUSCULAR | Status: AC
Start: 2015-03-30 — End: 2015-03-30
  Administered 2015-03-30: 0.9 mL
  Filled 2015-03-30: qty 10

## 2015-03-30 MED ORDER — DOCUSATE SODIUM 100 MG PO CAPS: 100.0000 mg | ORAL_CAPSULE | Freq: Two times a day (BID) | ORAL | Status: DC

## 2015-03-30 NOTE — ED Provider Notes (Signed)
CSN: [id number]     Arrival date & time 03/30/15  1122 History   First MD Initiated Contact with Patient 03/30/15 1235     No chief complaint on file.    (Consider location/radiation/quality/duration/timing/severity/associated sxs/prior Treatment) HPI Comments: George Dickson is a 44 y.o. male with a PMHx of CAD, OSA, HTN, HLD, anxiety, nephrolithiasis, GERD, MI s/p stenting, and DM2, who presents to the ED with complaints of constipation 2 weeks. He reports he has not had a bowel movement weeks, he has had issues with constipation in the past, has tried mag citrate and an unknown stool softener with no relief. He also reports one week of 10/10 intermittent lower abdominal cramping that radiates to the rectum only occurring when trying to have a bowel movement or standing, improves with ibuprofen. He reports some tenesmus. He denies any hematochezia, fevers, chills, chest pain, shortness breath, nausea, vomiting, diarrhea, obstipation, testicular pain or swelling, penile pain or discharge, dysuria, hematuria, numbness, tingling, weakness, recent travel, sick contacts, suspicious food intake, change in medications, narcotic use, dehydration, antibiotic use, NSAID use, or alcohol use.  Patient is a 44 y.o. male presenting with constipation. The history is provided by the patient. No language interpreter was used.  Constipation Severity:  Moderate Time since last bowel movement:  2 weeks Timing:  Constant Progression:  Unchanged Chronicity:  Recurrent Context: not dehydration, not dietary changes, not medication and not narcotics   Stool description:  None produced Relieved by:  Nothing Worsened by:  Nothing tried Ineffective treatments:  Stool softeners and laxatives Associated symptoms: abdominal pain   Associated symptoms: no diarrhea, no dysuria, no fever, no flatus, no hematochezia, no nausea, no urinary retention and no vomiting   Risk factors: no change in medication, no hx of abdominal  surgery, no recent antibiotic use, no recent surgery and no recent travel     Past Medical History  Diagnosis Date  . Stented coronary artery   . Hypertension   . Coronary artery disease JUNE 2013    inferior ST-segment  elevation  MI- DES stent -proximaL RCA  . OSA (obstructive sleep apnea) 12/06/2011    initial sleep study  completed, CPAP /BiPAP TITRATION DONE 12/20/11  . Dyslipidemia   . Pneumonia   . Anxiety   . Kidney stones   . GERD (gastroesophageal reflux disease)     occasionally takes TUMS  . Headache(784.0)   . Old myocardial infarction   . Type II diabetes mellitus ([place])     "dx'd today" (08/13/2013)   Past Surgical History  Procedure Laterality Date  . Cardiac catheterization  JUNE 1,2013    thrombectomy of 100% thrombotic  occlusion of  proximal RCA  . Coronary angioplasty  10/23/2011    PCI - proximal  RCA with  Promus ElEMENT  des 3.0 mm x 9mm ; final diameter 3.3mm distal, 3.35 mm proximal   . Doppler echocardiography  10/25/2011    EF 57-01%;XB diastolic function normal ,systolic function normal  . Shoulder arthroscopy Right 1992  . Lumbar laminectomy/decompression microdiscectomy Right 08/17/2013    Procedure: LUMBAR FOUR TO FIVE LUMBAR LAMINECTOMY/DECOMPRESSION MICRODISCECTOMY 1 LEVEL;  Surgeon: Charlie Pitter, MD;  Location: [facility];  Service: Neurosurgery;  Laterality: Right;  . Left heart catheterization with coronary angiogram N/A 10/23/2011    Procedure: LEFT HEART CATHETERIZATION WITH CORONARY ANGIOGRAM;  Surgeon: [name], MD;  Location: [facility] CATH LAB;  Service: Cardiovascular;  Laterality: N/A;  . Percutaneous coronary stent intervention (pci-s)  10/23/2011  Procedure: PERCUTANEOUS CORONARY STENT INTERVENTION (PCI-S);  Surgeon: [name], MD;  Location: [facility] CATH LAB;  Service: Cardiovascular;;   Family History  Problem Relation Age of Onset  . Coronary artery disease Mother   . Hypertension Mother   . Diabetes type II Mother    Social  History  Substance Use Topics  . Smoking status: Current Every Day Smoker -- 0.25 packs/day for 4 years    Types: Cigars, Cigarettes  . Smokeless tobacco: Never Used     Comment: trying to quit - has not had any cigarettes or cigars for 3 weeks  . Alcohol Use: No     Comment: pt reports stopping drinking ETOH 05/2013    Review of Systems  Constitutional: Negative for fever and chills.  Respiratory: Negative for shortness of breath.   Cardiovascular: Negative for chest pain.  Gastrointestinal: Positive for abdominal pain, constipation and rectal pain. Negative for nausea, vomiting, diarrhea, hematochezia, anal bleeding and flatus.  Genitourinary: Negative for dysuria, hematuria, discharge, scrotal swelling, penile pain and testicular pain.  Musculoskeletal: Negative for myalgias and arthralgias.  Skin: Negative for color change.  Allergic/Immunologic: Negative for immunocompromised state.  Neurological: Negative for weakness and numbness.  Psychiatric/Behavioral: Negative for confusion.   10 Systems reviewed and are negative for acute change except as noted in the HPI.    Allergies  Review of patient's allergies indicates no known allergies.  Home Medications   Prior to Admission medications   Medication Sig Start Date End Date Taking? Authorizing Provider  atorvastatin (LIPITOR) 40 MG tablet Take 40 mg by mouth daily. 07/27/13   Historical Provider, MD  clopidogrel (PLAVIX) 75 MG tablet Take 75 mg by mouth daily. 07/27/13   Historical Provider, MD  cyclobenzaprine (FLEXERIL) 10 MG tablet Take 1 tablet (10 mg total) by mouth 3 (three) times daily as needed for muscle spasms. 08/17/13   Earnie Larsson, MD  docusate sodium 100 MG CAPS Take 100 mg by mouth 2 (two) times daily. 08/15/13   Melton Alar, PA-C  HYDROcodone-acetaminophen (NORCO/VICODIN) 5-325 MG per tablet Take 1-2 tablets by mouth every 6 (six) hours as needed for moderate pain.    Historical Provider, MD    HYDROcodone-acetaminophen (NORCO/VICODIN) 5-325 MG per tablet Take 1-2 tablets by mouth every 4 (four) hours as needed for moderate pain. 08/17/13   Earnie Larsson, MD  HYDROmorphone (DILAUDID) 2 MG tablet Take 2 mg by mouth every 4 (four) hours as needed for severe pain (may take 1 to 2 tabs every 4 hours as needed).    Historical Provider, MD  ibuprofen (ADVIL,MOTRIN) 200 MG tablet Take 800 mg by mouth every 6 (six) hours as needed for mild pain.     Historical Provider, MD  Insulin Glargine (LANTUS) 100 UNIT/ML Solostar Pen Inject 30 Units into the skin daily. 08/15/13   Bobby Rumpf York, PA-C  insulin lispro (HUMALOG) 100 UNIT/ML KiwkPen Inject 8 Units into the skin 3 (three) times daily with meals. 08/15/13   Melton Alar, PA-C  metaxalone (SKELAXIN) 800 MG tablet Take 800 mg by mouth 2 (two) times daily. 07/30/13   Historical Provider, MD  metoprolol tartrate (LOPRESSOR) 25 MG tablet Take 25 mg by mouth daily. 07/27/13   Historical Provider, MD  NEEDLE, DISP, 30 G (B-D DISP NEEDLE 30GX1") 30G X 1" MISC 1 Units by Does not apply route 4 (four) times daily -  before meals and at bedtime. 08/15/13   Marianne L York, PA-C   BP 128/85 mmHg  Pulse 83  Temp(Src) 98.4 F (36.9 C) (Oral)  Resp 18  Ht 6\' 3"  (1.905 m)  Wt 310 lb (140.615 kg)  BMI 38.75 kg/m2  SpO2 98% Physical Exam  Constitutional: He is oriented to person, place, and time. Vital signs are normal. He appears well-developed and well-nourished.  Non-toxic appearance. No distress.  Afebrile, nontoxic, NAD  HENT:  Head: Normocephalic and atraumatic.  Mouth/Throat: Oropharynx is clear and moist and mucous membranes are normal.  Eyes: Conjunctivae and EOM are normal. Right eye exhibits no discharge. Left eye exhibits no discharge.  Neck: Normal range of motion. Neck supple.  Cardiovascular: Normal rate, regular rhythm, normal heart sounds and intact distal pulses.  Exam reveals no gallop and no friction rub.   No murmur  heard. Pulmonary/Chest: Effort normal and breath sounds normal. No respiratory distress. He has no decreased breath sounds. He has no wheezes. He has no rhonchi. He has no rales.  Abdominal: Soft. Normal appearance and bowel sounds are normal. He exhibits no distension. There is tenderness in the suprapubic area. There is no rigidity, no rebound, no guarding, no CVA tenderness, no tenderness at McBurney's point and negative Murphy's sign.    Soft, nondistended, +BS throughout, with mild suprapubic TTP, no r/g/r, neg murphy's, neg mcburney's, no CVA TTP   Genitourinary: Rectum normal. Rectal exam shows no external hemorrhoid, no internal hemorrhoid, no fissure, no mass, no tenderness and anal tone normal. Guaiac negative stool. Prostate is tender. Prostate is not enlarged.  Chaperone present No gross blood noted on rectal exam, FOBT neg, normal tone, no rectal wall tenderness, no mass or fissure, no hemorrhoids. Prostate without enlargement, not warm or boggy, but with mild TTP during digital exam.  Musculoskeletal: Normal range of motion.  Neurological: He is alert and oriented to person, place, and time. He has normal strength. No sensory deficit.  Skin: Skin is warm, dry and intact. No rash noted.  Psychiatric: He has a normal mood and affect.  Nursing note and vitals reviewed.   ED Course  Procedures (including critical care time) Labs Review Labs Reviewed  URINALYSIS, ROUTINE W REFLEX MICROSCOPIC (NOT AT [facility])  OCCULT BLOOD X 1 CARD TO LAB, STOOL  POC OCCULT BLOOD, ED  GC/CHLAMYDIA PROBE AMP ([place]) NOT AT [facility]    Imaging Review Dg Abd Acute W/chest  03/30/2015  CLINICAL DATA:  Constipation. EXAM: DG ABDOMEN ACUTE W/ 1V CHEST COMPARISON:  08/13/2013 chest radiograph. FINDINGS: Stable cardiomediastinal silhouette with normal heart size. No pneumothorax. No pleural effusion. Clear lungs, with no focal lung consolidation and no pulmonary edema. No disproportionately dilated small  bowel loops or air-fluid levels. Minimal colonic stool. No evidence of pneumatosis or pneumoperitoneum. Clustered 8 mm and 9 mm stones in the left lower kidney. Mild degenerative changes in the lower lumbar spine. IMPRESSION: 1.  No active disease in the chest. 2. Nonobstructive bowel gas pattern.  Minimal colonic stool. 3. Left nephrolithiasis. Electronically Signed   By: Ilona Sorrel M.D.   On: 03/30/2015 13:55   I have personally reviewed and evaluated these images and lab results as part of my medical decision-making.   EKG Interpretation None      MDM   Final diagnoses:  Constipation, unspecified constipation type  Prostatitis, unspecified prostatitis type  Lower abdominal pain  Nephrolithiasis    44 y.o. male here with constipation with no bowel movement 2 weeks. Mild intermittent abdominal cramping when trying have a bowel movement. On exam, mild suprapubic tenderness. No fecal impaction,  no hemorrhoids, mild tenderness of the prostate concerning for possible prostatitis. Will obtain FOBT card and urinalysis. Will obtain GC/CT testing off his urine. Doubt need for labs but will get acute abd series to ensure no obstruction. Declines pain meds. Will reassess shortly.  2:10 PM U/A clear but given physical exam findings c/w prostatitis, will treat as such. Will give rocephin here then d/c home with doxycycline. Discussed tylenol/motrin for pain. Acute abd series shows moderate stool, will Start on miralax/colace, and discussed fiber/water intake. Xray also shows stable nephrolithiasis, doubt this is playing a role today. Will have him f/up with PCP in 1wk, and urology in 2wks. I explained the diagnosis and have given explicit precautions to return to the ER including for any other new or worsening symptoms. The patient understands and accepts the medical plan as it's been dictated and I have answered their questions. Discharge instructions concerning home care and prescriptions have been  given. The patient is STABLE and is discharged to home in good condition.  BP 128/85 mmHg  Pulse 83  Temp(Src) 98.4 F (36.9 C) (Oral)  Resp 18  Ht 6\' 3"  (1.905 m)  Wt 310 lb (140.615 kg)  BMI 38.75 kg/m2  SpO2 98%  Meds ordered this encounter  Medications  . cefTRIAXone (ROCEPHIN) injection 250 mg    Sig:     Order Specific Question:  Antibiotic Indication:    Answer:  Other Indication (list below)    Order Specific Question:  Other Indication:    Answer:  prostatitis  . doxycycline (VIBRAMYCIN) 100 MG capsule    Sig: Take 1 capsule (100 mg total) by mouth 2 (two) times daily. One po bid x 10 days    Dispense:  20 capsule    Refill:  0    Order Specific Question:  Supervising Provider    Answer:  MILLER, BRIAN [[id number]]  . polyethylene glycol (MIRALAX / GLYCOLAX) packet    Sig: Take 17 g by mouth daily.    Dispense:  14 each    Refill:  0    Order Specific Question:  Supervising Provider    Answer:  Sabra Heck, BRIAN [[id number]]  . docusate sodium (COLACE) 100 MG capsule    Sig: Take 1 capsule (100 mg total) by mouth every 12 (twelve) hours.    Dispense:  60 capsule    Refill:  0    Order Specific Question:  Supervising Provider    Answer:  Noemi Chapel [[id number]]      Mylz Yuan Camprubi-Soms, PA-C 03/30/15 1412  Blanchie Dessert, MD 03/30/15 1610

## 2015-03-30 NOTE — ED Notes (Signed)
Patient transported to X-ray 

## 2015-03-30 NOTE — ED Notes (Signed)
Pt c/o constipation, LBM x 14 days per pt, pt denies relief with magnesium & stool softners, pt c/o mid lower abd pain, pt takes 800 mg Ibuprofen, pt denies n/v, A&O x4

## 2015-03-30 NOTE — Discharge Instructions (Signed)
Use miralax and colace as directed until you achieve daily soft bowel movements. Once you've had a bowel movement, continue using miralax to maintain soft daily stools, if they become too watery then decrease the use to every other day to maintain your stools coming daily and being soft. Drink plenty of water, increase fiber intake. You're being treated for prostatitis, take the antibiotics as directed. Follow up with your regular doctor in 1 week, and with the urologist in 2 weeks to follow up with your prostate issue. Return to the ER for changes or worsening symptoms.   Constipation, Adult Constipation is when a person has fewer than three bowel movements a week, has difficulty having a bowel movement, or has stools that are dry, hard, or larger than normal. As people grow older, constipation is more common. A low-fiber diet, not taking in enough fluids, and taking certain medicines may make constipation worse.  CAUSES   Certain medicines, such as antidepressants, pain medicine, iron supplements, antacids, and water pills.   Certain diseases, such as diabetes, irritable bowel syndrome (IBS), thyroid disease, or depression.   Not drinking enough water.   Not eating enough fiber-rich foods.   Stress or travel.   Lack of physical activity or exercise.   Ignoring the urge to have a bowel movement.   Using laxatives too much.  SIGNS AND SYMPTOMS   Having fewer than three bowel movements a week.   Straining to have a bowel movement.   Having stools that are hard, dry, or larger than normal.   Feeling full or bloated.   Pain in the lower abdomen.   Not feeling relief after having a bowel movement.  DIAGNOSIS  Your health care provider will take a medical history and perform a physical exam. Further testing may be done for severe constipation. Some tests may include:  A barium enema X-ray to examine your rectum, colon, and, sometimes, your small intestine.   A  sigmoidoscopy to examine your lower colon.   A colonoscopy to examine your entire colon. TREATMENT  Treatment will depend on the severity of your constipation and what is causing it. Some dietary treatments include drinking more fluids and eating more fiber-rich foods. Lifestyle treatments may include regular exercise. If these diet and lifestyle recommendations do not help, your health care provider may recommend taking over-the-counter laxative medicines to help you have bowel movements. Prescription medicines may be prescribed if over-the-counter medicines do not work.  HOME CARE INSTRUCTIONS   Eat foods that have a lot of fiber, such as fruits, vegetables, whole grains, and beans.  Limit foods high in fat and processed sugars, such as french fries, hamburgers, cookies, candies, and soda.   A fiber supplement may be added to your diet if you cannot get enough fiber from foods.   Drink enough fluids to keep your urine clear or pale yellow.   Exercise regularly or as directed by your health care provider.   Go to the restroom when you have the urge to go. Do not hold it.   Only take over-the-counter or prescription medicines as directed by your health care provider. Do not take other medicines for constipation without talking to your health care provider first.  [place] IF:   You have bright red blood in your stool.   Your constipation lasts for more than 4 days or gets worse.   You have abdominal or rectal pain.   You have thin, pencil-like stools.   You have unexplained  weight loss. MAKE SURE YOU:   Understand these instructions.  Will watch your condition.  Will get help right away if you are not doing well or get worse.   This information is not intended to replace advice given to you by your health care provider. Make sure you discuss any questions you have with your health care provider.   Document Released: 02/06/2004 Document Revised:  05/31/2014 Document Reviewed: 02/19/2013 Elsevier Interactive Patient Education 2016 Elsevier Inc.  High-Fiber Diet Fiber, also called dietary fiber, is a type of carbohydrate found in fruits, vegetables, whole grains, and beans. A high-fiber diet can have many health benefits. Your health care provider may recommend a high-fiber diet to help:  Prevent constipation. Fiber can make your bowel movements more regular.  Lower your cholesterol.  Relieve hemorrhoids, uncomplicated diverticulosis, or irritable bowel syndrome.  Prevent overeating as part of a weight-loss plan.  Prevent heart disease, type 2 diabetes, and certain cancers. WHAT IS MY PLAN? The recommended daily intake of fiber includes:  38 grams for men under age 5.  29 grams for men over age 71.  50 grams for women under age 43.  10 grams for women over age 25. You can get the recommended daily intake of dietary fiber by eating a variety of fruits, vegetables, grains, and beans. Your health care provider may also recommend a fiber supplement if it is not possible to get enough fiber through your diet. WHAT DO I NEED TO KNOW ABOUT A HIGH-FIBER DIET?  Fiber supplements have not been widely studied for their effectiveness, so it is better to get fiber through food sources.  Always check the fiber content on thenutrition facts label of any prepackaged food. Look for foods that contain at least 5 grams of fiber per serving.  Ask your dietitian if you have questions about specific foods that are related to your condition, especially if those foods are not listed in the following section.  Increase your daily fiber consumption gradually. Increasing your intake of dietary fiber too quickly may cause bloating, cramping, or gas.  Drink plenty of water. Water helps you to digest fiber. WHAT FOODS CAN I EAT? Grains Whole-grain breads. Multigrain cereal. Oats and oatmeal. Brown rice. Barley. Bulgur wheat. [place]. Bran muffins.  Popcorn. Rye wafer crackers. Vegetables Sweet potatoes. Spinach. Kale. Artichokes. Cabbage. Broccoli. Green peas. Carrots. Squash. Fruits Berries. Pears. Apples. Oranges. Avocados. Prunes and raisins. Dried figs. Meats and Other Protein Sources [place], kidney, pinto, and soy beans. Split peas. Lentils. Nuts and seeds. Dairy Fiber-fortified yogurt. Beverages Fiber-fortified soy milk. Fiber-fortified orange juice. Other Fiber bars. The items listed above may not be a complete list of recommended foods or beverages. Contact your dietitian for more options. WHAT FOODS ARE NOT RECOMMENDED? Grains White bread. Pasta made with refined flour. White rice. Vegetables Fried potatoes. Canned vegetables. Well-cooked vegetables.  Fruits Fruit juice. Cooked, strained fruit. Meats and Other Protein Sources Fatty cuts of meat. Fried Sales executive or fried fish. Dairy Milk. Yogurt. Cream cheese. Sour cream. Beverages Soft drinks. Other Cakes and pastries. Butter and oils. The items listed above may not be a complete list of foods and beverages to avoid. Contact your dietitian for more information. WHAT ARE SOME TIPS FOR INCLUDING HIGH-FIBER FOODS IN MY DIET?  Eat a wide variety of high-fiber foods.  Make sure that half of all grains consumed each day are whole grains.  Replace breads and cereals made from refined flour or white flour with whole-grain breads and cereals.  Replace  white rice with brown rice, bulgur wheat, or millet.  Start the day with a breakfast that is high in fiber, such as a cereal that contains at least 5 grams of fiber per serving.  Use beans in place of meat in soups, salads, or pasta.  Eat high-fiber snacks, such as berries, raw vegetables, nuts, or popcorn.   This information is not intended to replace advice given to you by your health care provider. Make sure you discuss any questions you have with your health care provider.   Document Released: 05/10/2005 Document  Revised: 05/31/2014 Document Reviewed: 10/23/2013 Elsevier Interactive Patient Education 2016 Elsevier Inc.  Prostatitis Prostatitis is redness, soreness, and puffiness (swelling) of the prostate gland. The prostate gland is the walnut-sized gland located just below your bladder. HOME CARE:   Take all medicines as told by your doctor.  Take warm-water baths (sitz baths) as told by your doctor. GET HELP IF:  Your symptoms get worse, not better.  You have a fever. GET HELP RIGHT AWAY IF:   You have chills.  You feel sick to your stomach (nauseous) or like you will throw up (vomit).  You feel lightheaded or like you will pass out (faint).  You are unable to pee (urinate).  You have blood or blood clumps (clots) in your pee (urine). MAKE SURE YOU:  Understand these instructions.  Will watch your condition.  Will get help right away if you are not doing well or get worse.   This information is not intended to replace advice given to you by your health care provider. Make sure you discuss any questions you have with your health care provider.   Document Released: 11/09/2011 Document Revised: 05/31/2014 Document Reviewed: 11/27/2012 Elsevier Interactive Patient Education [employer].

## 2015-03-31 LAB — GC/CHLAMYDIA PROBE AMP (~~LOC~~) NOT AT ARMC
CHLAMYDIA, DNA PROBE: NEGATIVE
Neisseria Gonorrhea: NEGATIVE

## 2015-10-07 ENCOUNTER — Emergency Department (HOSPITAL_COMMUNITY): Payer: BLUE CROSS/BLUE SHIELD

## 2015-10-07 ENCOUNTER — Emergency Department (HOSPITAL_COMMUNITY)
Admission: EM | Admit: 2015-10-07 | Discharge: 2015-10-08 | Disposition: A | Discharge status: Home / Self Care | Payer: BLUE CROSS/BLUE SHIELD | Attending: Emergency Medicine | Admitting: Emergency Medicine

## 2015-10-07 ENCOUNTER — Encounter (HOSPITAL_COMMUNITY): Payer: Self-pay | Admitting: Emergency Medicine

## 2015-10-07 DIAGNOSIS — Z9861 Coronary angioplasty status: Secondary | ICD-10-CM | POA: Insufficient documentation

## 2015-10-07 DIAGNOSIS — Z8719 Personal history of other diseases of the digestive system: Secondary | ICD-10-CM | POA: Insufficient documentation

## 2015-10-07 DIAGNOSIS — R0602 Shortness of breath: Secondary | ICD-10-CM | POA: Insufficient documentation

## 2015-10-07 DIAGNOSIS — F17210 Nicotine dependence, cigarettes, uncomplicated: Secondary | ICD-10-CM | POA: Diagnosis not present

## 2015-10-07 DIAGNOSIS — Z9889 Other specified postprocedural states: Secondary | ICD-10-CM | POA: Insufficient documentation

## 2015-10-07 DIAGNOSIS — I2510 Atherosclerotic heart disease of native coronary artery without angina pectoris: Secondary | ICD-10-CM | POA: Diagnosis not present

## 2015-10-07 DIAGNOSIS — Z79899 Other long term (current) drug therapy: Secondary | ICD-10-CM | POA: Diagnosis not present

## 2015-10-07 DIAGNOSIS — M549 Dorsalgia, unspecified: Secondary | ICD-10-CM | POA: Diagnosis not present

## 2015-10-07 DIAGNOSIS — E1165 Type 2 diabetes mellitus with hyperglycemia: Secondary | ICD-10-CM | POA: Diagnosis not present

## 2015-10-07 DIAGNOSIS — Z8669 Personal history of other diseases of the nervous system and sense organs: Secondary | ICD-10-CM | POA: Insufficient documentation

## 2015-10-07 DIAGNOSIS — E785 Hyperlipidemia, unspecified: Secondary | ICD-10-CM | POA: Diagnosis not present

## 2015-10-07 DIAGNOSIS — Z8701 Personal history of pneumonia (recurrent): Secondary | ICD-10-CM | POA: Insufficient documentation

## 2015-10-07 DIAGNOSIS — I252 Old myocardial infarction: Secondary | ICD-10-CM | POA: Diagnosis not present

## 2015-10-07 DIAGNOSIS — Z794 Long term (current) use of insulin: Secondary | ICD-10-CM | POA: Insufficient documentation

## 2015-10-07 DIAGNOSIS — Z87442 Personal history of urinary calculi: Secondary | ICD-10-CM | POA: Insufficient documentation

## 2015-10-07 DIAGNOSIS — R739 Hyperglycemia, unspecified: Secondary | ICD-10-CM

## 2015-10-07 DIAGNOSIS — Z8659 Personal history of other mental and behavioral disorders: Secondary | ICD-10-CM | POA: Insufficient documentation

## 2015-10-07 DIAGNOSIS — Z7902 Long term (current) use of antithrombotics/antiplatelets: Secondary | ICD-10-CM | POA: Insufficient documentation

## 2015-10-07 DIAGNOSIS — R61 Generalized hyperhidrosis: Secondary | ICD-10-CM | POA: Diagnosis not present

## 2015-10-07 DIAGNOSIS — I10 Essential (primary) hypertension: Secondary | ICD-10-CM | POA: Diagnosis not present

## 2015-10-07 LAB — URINE MICROSCOPIC-ADD ON: SQUAMOUS EPITHELIAL / LPF: NONE SEEN

## 2015-10-07 LAB — COMPREHENSIVE METABOLIC PANEL
ALT: 46 U/L (ref 17–63)
ANION GAP: 9 (ref 5–15)
AST: 29 U/L (ref 15–41)
Albumin: 3.3 g/dL — ABNORMAL LOW (ref 3.5–5.0)
Alkaline Phosphatase: 80 U/L (ref 38–126)
BILIRUBIN TOTAL: 0.3 mg/dL (ref 0.3–1.2)
BUN: 11 mg/dL (ref 6–20)
CHLORIDE: 106 mmol/L (ref 101–111)
CO2: 23 mmol/L (ref 22–32)
Calcium: 8.8 mg/dL — ABNORMAL LOW (ref 8.9–10.3)
Creatinine, Ser: 1.14 mg/dL (ref 0.61–1.24)
Glucose, Bld: 293 mg/dL — ABNORMAL HIGH (ref 65–99)
POTASSIUM: 3.7 mmol/L (ref 3.5–5.1)
Sodium: 138 mmol/L (ref 135–145)
TOTAL PROTEIN: 5.7 g/dL — AB (ref 6.5–8.1)

## 2015-10-07 LAB — URINALYSIS, ROUTINE W REFLEX MICROSCOPIC
Bilirubin Urine: NEGATIVE
Glucose, UA: >1000 mg/dL — AB
Hgb urine dipstick: NEGATIVE
Ketones, ur: NEGATIVE mg/dL
LEUKOCYTES UA: NEGATIVE
NITRITE: NEGATIVE
PROTEIN: 30 mg/dL — AB
SPECIFIC GRAVITY, URINE: 1.021 (ref 1.005–1.030)
pH: 6 (ref 5.0–8.0)

## 2015-10-07 LAB — CBG MONITORING, ED: GLUCOSE-CAPILLARY: 234 mg/dL — AB (ref 65–99)

## 2015-10-07 LAB — CBC WITH DIFFERENTIAL/PLATELET
BASOS ABS: 0.1 10*3/uL (ref 0.0–0.1)
BASOS PCT: 2 %
Eosinophils Absolute: 0.2 10*3/uL (ref 0.0–0.7)
Eosinophils Relative: 3 %
HCT: 39.3 % (ref 39.0–52.0)
Hemoglobin: 12.8 g/dL — ABNORMAL LOW (ref 13.0–17.0)
LYMPHS ABS: 1.9 10*3/uL (ref 0.7–4.0)
Lymphocytes Relative: 29 %
MCH: 26.6 pg (ref 26.0–34.0)
MCHC: 32.6 g/dL (ref 30.0–36.0)
MCV: 81.7 fL (ref 78.0–100.0)
MONOS PCT: 5 %
Monocytes Absolute: 0.3 10*3/uL (ref 0.1–1.0)
NEUTROS ABS: 4 10*3/uL (ref 1.7–7.7)
Neutrophils Relative %: 61 %
PLATELETS: 256 10*3/uL (ref 150–400)
RBC: 4.81 MIL/uL (ref 4.22–5.81)
RDW: 13.5 % (ref 11.5–15.5)
WBC: 6.5 10*3/uL (ref 4.0–10.5)

## 2015-10-07 NOTE — ED Notes (Signed)
Pt's CBG result was 234. Informed Bobby - RN.

## 2015-10-07 NOTE — ED Notes (Signed)
Pt. presents with multiple complaints : headache onset last week , bilateral hands swelling onset 2 days ago denies injury , chronic back pain for several years , generalized weakness/fatigue , elevated blood sugar and SOB .

## 2015-10-07 NOTE — ED Notes (Signed)
Dr. Audie Pinto ( EDP ) notified on elevated blood sugar .

## 2015-10-08 MED ORDER — DIPHENHYDRAMINE HCL 25 MG PO CAPS
50.0000 mg | ORAL_CAPSULE | Freq: Once | ORAL | Status: DC
  Filled 2015-10-08: qty 2

## 2015-10-08 MED ORDER — KETOROLAC TROMETHAMINE 60 MG/2ML IM SOLN
60.0000 mg | Freq: Once | INTRAMUSCULAR | Status: DC
  Filled 2015-10-08: qty 2

## 2015-10-08 MED ORDER — METOCLOPRAMIDE HCL 10 MG PO TABS
10.0000 mg | ORAL_TABLET | Freq: Once | ORAL | Status: DC
  Filled 2015-10-08: qty 1

## 2015-10-08 NOTE — ED Provider Notes (Signed)
CSN: [id number]     Arrival date & time 10/07/15  1904 History  By signing my name below, I, Irene Pap, attest that this documentation has been prepared under the direction and in the presence of Everlene Balls, MD. Electronically Signed: Irene Pap, ED Scribe. 10/08/2015. 12:13 AM.   Chief Complaint  Patient presents with  . Headache  . Edema  . Back Pain  . Fatigue  . Hyperglycemia   The history is provided by the patient. No language interpreter was used.  HPI Comments: George Dickson is a 45 y.o. male with a hx of CAD, HTN, OSA, Type II DM, headache, and MI who presents to the Emergency Department complaining of bilateral hand swelling onset one week ago. Pt reports associated back pain, chills, diaphoresis, headache, SOB, generalized fatigue and increased CBG levels. Pt has not taken anything for his blood sugars in over a year. He was told to stop taking the insulin by his PCP. Pt has taken ibuprofen 800 mg for headache to no relief. He denies fever or cough.   Past Medical History  Diagnosis Date  . Stented coronary artery   . Hypertension   . Coronary artery disease JUNE 2013    inferior ST-segment  elevation  MI- DES stent -proximaL RCA  . OSA (obstructive sleep apnea) 12/06/2011    initial sleep study  completed, CPAP /BiPAP TITRATION DONE 12/20/11  . Dyslipidemia   . Pneumonia   . Anxiety   . Kidney stones   . GERD (gastroesophageal reflux disease)     occasionally takes TUMS  . Headache(784.0)   . Old myocardial infarction   . Type II diabetes mellitus ([place])     "dx'd today" (08/13/2013)   Past Surgical History  Procedure Laterality Date  . Cardiac catheterization  JUNE 1,2013    thrombectomy of 100% thrombotic  occlusion of  proximal RCA  . Coronary angioplasty  10/23/2011    PCI - proximal  RCA with  Promus ElEMENT  des 3.0 mm x 78mm ; final diameter 3.69mm distal, 3.35 mm proximal   . Doppler echocardiography  10/25/2011    EF [id number] diastolic function normal  ,systolic function normal  . Shoulder arthroscopy Right 1992  . Lumbar laminectomy/decompression microdiscectomy Right 08/17/2013    Procedure: LUMBAR FOUR TO FIVE LUMBAR LAMINECTOMY/DECOMPRESSION MICRODISCECTOMY 1 LEVEL;  Surgeon: Charlie Pitter, MD;  Location: [facility];  Service: Neurosurgery;  Laterality: Right;  . Left heart catheterization with coronary angiogram N/A 10/23/2011    Procedure: LEFT HEART CATHETERIZATION WITH CORONARY ANGIOGRAM;  Surgeon: [name], MD;  Location: [facility] CATH LAB;  Service: Cardiovascular;  Laterality: N/A;  . Percutaneous coronary stent intervention (pci-s)  10/23/2011    Procedure: PERCUTANEOUS CORONARY STENT INTERVENTION (PCI-S);  Surgeon: [name], MD;  Location: [facility] CATH LAB;  Service: Cardiovascular;;   Family History  Problem Relation Age of Onset  . Coronary artery disease Mother   . Hypertension Mother   . Diabetes type II Mother    Social History  Substance Use Topics  . Smoking status: Current Every Day Smoker -- 0.25 packs/day for 4 years    Types: Cigars, Cigarettes  . Smokeless tobacco: Never Used     Comment: trying to quit - has not had any cigarettes or cigars for 3 weeks  . Alcohol Use: No    Review of Systems  10 Systems reviewed and all are negative for acute change except as noted in the HPI.  Allergies  Review  of patient's allergies indicates no known allergies.  Home Medications   Prior to Admission medications   Medication Sig Start Date End Date Taking? Authorizing Provider  atorvastatin (LIPITOR) 40 MG tablet Take 40 mg by mouth daily. 07/27/13   Historical Provider, MD  clopidogrel (PLAVIX) 75 MG tablet Take 75 mg by mouth daily. 07/27/13   Historical Provider, MD  cyclobenzaprine (FLEXERIL) 10 MG tablet Take 1 tablet (10 mg total) by mouth 3 (three) times daily as needed for muscle spasms. Patient not taking: Reported on 03/30/2015 08/17/13   Earnie Larsson, MD  docusate sodium (COLACE) 100 MG capsule Take 1 capsule (100  mg total) by mouth every 12 (twelve) hours. 03/30/15   Mercedes Camprubi-Soms, PA-C  docusate sodium 100 MG CAPS Take 100 mg by mouth 2 (two) times daily. Patient not taking: Reported on 03/30/2015 08/15/13   Melton Alar, PA-C  doxycycline (VIBRAMYCIN) 100 MG capsule Take 1 capsule (100 mg total) by mouth 2 (two) times daily. One po bid x 10 days 03/30/15   Mercedes Camprubi-Soms, PA-C  HYDROcodone-acetaminophen (NORCO/VICODIN) 5-325 MG per tablet Take 1-2 tablets by mouth every 6 (six) hours as needed for moderate pain.    Historical Provider, MD  HYDROcodone-acetaminophen (NORCO/VICODIN) 5-325 MG per tablet Take 1-2 tablets by mouth every 4 (four) hours as needed for moderate pain. Patient not taking: Reported on 03/30/2015 08/17/13   Earnie Larsson, MD  HYDROmorphone (DILAUDID) 2 MG tablet Take 2 mg by mouth every 4 (four) hours as needed for severe pain (may take 1 to 2 tabs every 4 hours as needed).    Historical Provider, MD  ibuprofen (ADVIL,MOTRIN) 200 MG tablet Take 800 mg by mouth every 6 (six) hours as needed for mild pain.     Historical Provider, MD  Insulin Glargine (LANTUS) 100 UNIT/ML Solostar Pen Inject 30 Units into the skin daily. Patient not taking: Reported on 03/30/2015 08/15/13   Melton Alar, PA-C  insulin lispro (HUMALOG) 100 UNIT/ML KiwkPen Inject 8 Units into the skin 3 (three) times daily with meals. Patient not taking: Reported on 03/30/2015 08/15/13   Melton Alar, PA-C  metaxalone (SKELAXIN) 800 MG tablet Take 800 mg by mouth 2 (two) times daily. 07/30/13   Historical Provider, MD  metoprolol tartrate (LOPRESSOR) 25 MG tablet Take 25 mg by mouth daily. 07/27/13   Historical Provider, MD  NEEDLE, DISP, 30 G (B-D DISP NEEDLE 30GX1") 30G X 1" MISC 1 Units by Does not apply route 4 (four) times daily -  before meals and at bedtime. 08/15/13   Bobby Rumpf [place], PA-C  polyethylene glycol (MIRALAX / GLYCOLAX) packet Take 17 g by mouth daily. 03/30/15   Mercedes Camprubi-Soms, PA-C   BP  158/85 mmHg  Pulse 85  Temp(Src) 99 F (37.2 C) (Oral)  Resp 18  Ht 6\' 2"  (1.88 m)  Wt 308 lb 5 oz (139.85 kg)  BMI 39.57 kg/m2  SpO2 98% Physical Exam  Constitutional: He is oriented to person, place, and time. Vital signs are normal. He appears well-developed and well-nourished.  Non-toxic appearance. He does not appear ill. No distress.  HENT:  Head: Normocephalic and atraumatic.  Nose: Nose normal.  Mouth/Throat: Oropharynx is clear and moist. No oropharyngeal exudate.  Eyes: Conjunctivae and EOM are normal. Pupils are equal, round, and reactive to light. No scleral icterus.  Neck: Normal range of motion. Neck supple. No tracheal deviation, no edema, no erythema and normal range of motion present. No thyroid mass and no thyromegaly  present.  Cardiovascular: Normal rate, regular rhythm, S1 normal, S2 normal, normal heart sounds, intact distal pulses and normal pulses.  Exam reveals no gallop and no friction rub.   No murmur heard. Pulmonary/Chest: Effort normal and breath sounds normal. No respiratory distress. He has no wheezes. He has no rhonchi. He has no rales.  Abdominal: Soft. Normal appearance and bowel sounds are normal. He exhibits no distension, no ascites and no mass. There is no hepatosplenomegaly. There is no tenderness. There is no rebound, no guarding and no CVA tenderness.  Musculoskeletal: Normal range of motion. He exhibits no edema or tenderness.  Lymphadenopathy:    He has no cervical adenopathy.  Neurological: He is alert and oriented to person, place, and time. He has normal strength. No cranial nerve deficit or sensory deficit.  Normal strength and sensation in all extremities; normal cerebellar testing  Skin: Skin is warm, dry and intact. No petechiae and no rash noted. He is not diaphoretic. No erythema. No pallor.  Psychiatric: He has a normal mood and affect. His behavior is normal. Judgment normal.  Nursing note and vitals reviewed.   ED Course   Procedures (including critical care time) DIAGNOSTIC STUDIES: Oxygen Saturation is 98% on RA, normal by my interpretation.    COORDINATION OF CARE: 12:13 AM-Discussed treatment plan which includes labs, x-ray, and EKG with pt at bedside and pt agreed to plan.    Labs Review Labs Reviewed  CBC WITH DIFFERENTIAL/PLATELET - Abnormal; Notable for the following:    Hemoglobin 12.8 (*)    All other components within normal limits  COMPREHENSIVE METABOLIC PANEL - Abnormal; Notable for the following:    Glucose, Bld 293 (*)    Calcium 8.8 (*)    Total Protein 5.7 (*)    Albumin 3.3 (*)    All other components within normal limits  URINALYSIS, ROUTINE W REFLEX MICROSCOPIC (NOT AT [facility]) - Abnormal; Notable for the following:    Glucose, UA >1000 (*)    Protein, ur 30 (*)    All other components within normal limits  URINE MICROSCOPIC-ADD ON - Abnormal; Notable for the following:    Bacteria, UA RARE (*)    All other components within normal limits  CBG MONITORING, ED - Abnormal; Notable for the following:    Glucose-Capillary 234 (*)    All other components within normal limits    Imaging Review Dg Chest 2 View  10/07/2015  CLINICAL DATA:  Shortness of Breath EXAM: CHEST  2 VIEW COMPARISON:  04/09/2015 FINDINGS: Cardiac shadow is within normal limits. The lungs are well aerated bilaterally. Minimal bibasilar atelectasis is noted. No sizable effusion or focal infiltrate is seen. No bony abnormality is noted. IMPRESSION: Mild bibasilar atelectasis. Electronically Signed   By: Inez Catalina M.D.   On: 10/07/2015 20:26   I have personally reviewed and evaluated these images and lab results as part of my medical decision-making.   EKG Interpretation   Date/Time:  Tuesday Oct 07 2015 19:34:35 EDT Ventricular Rate:  92 PR Interval:  142 QRS Duration: 102 QT Interval:  344 QTC Calculation: 425 R Axis:   -19 Text Interpretation:  Normal sinus rhythm Incomplete right bundle branch  block  Left ventricular hypertrophy Abnormal ECG No significant change  since last tracing Confirmed by Glynn Octave [phone]) on 10/07/2015  11:14:15 PM      MDM   Final diagnoses:  None    Patient presents to the ED for multiple complaints.   Likely related  to his high blood sugar.  They seem to be chronic complaints that are slowly worsening.  He was given toradol reglan and benadryl for his headache.  PCP fu advised within 3 days. He appears well and in NAD.  VS remain within his normal limits and he is safe for DC.  I personally performed the services described in this documentation, which was scribed in my presence. The recorded information has been reviewed and is accurate.     Everlene Balls, MD 10/08/15 [phone]

## 2015-10-08 NOTE — Discharge Instructions (Signed)
How to Avoid Diabetes Problems George Dickson, see your primary care doctor within 3 days for close follow up.  You may need to go back on diabetes medication.  Begin to diet and exercise as well.  If symptoms worsen, come back to the ED immediately. Thank you.   You can do a lot to prevent or slow down diabetes problems. Following your diabetes plan and taking care of yourself can reduce your risk of serious or life-threatening complications. Below, you will find certain things you can do to prevent diabetes problems. MANAGE YOUR DIABETES Follow your health care provider's, nurse educator's, and dietitian's instructions for managing your diabetes. They will teach you the basics of diabetes care. They can help answer questions you may have. Learn about diabetes and make healthy choices regarding eating and physical activity. Monitor your blood glucose level regularly. Your health care provider will help you decide how often to check your blood glucose level depending on your treatment goals and how well you are meeting them.  DO NOT USE NICOTINE Nicotine and diabetes are a dangerous combination. Nicotine raises your risk for diabetes problems. If you quit using nicotine, you will lower your risk for heart attack, stroke, nerve disease, and kidney disease. Your cholesterol and your blood pressure levels may improve. Your blood circulation will also improve. Do not use any tobacco products, including cigarettes, chewing tobacco, or electronic cigarettes. If you need help quitting, ask your health care provider. KEEP YOUR BLOOD PRESSURE UNDER CONTROL Your health care provider will determine your individualized target blood pressure based on your age, your medicines, how long you have had diabetes, and any other medical conditions you have. Blood pressure consists of two numbers. Generally, the goal is to keep your top number (systolic pressure) at or below 130, and your bottom number (diastolic pressure) at or  below 80. Your health care provider may recommend a lower target blood pressure reading, if appropriate. Meal planning, medicines, and exercise can help you reach your target blood pressure. Make sure your health care provider checks your blood pressure at every visit. KEEP YOUR CHOLESTEROL UNDER CONTROL Normal cholesterol levels will help prevent heart disease and stroke. These are the biggest health problems for people with diabetes. Keeping cholesterol levels under control can also help with blood flow. Have your cholesterol level checked at least once a year. Your health care provider may prescribe a medicine known as a statin. Statins lower your cholesterol. If you are not taking a statin, ask your health care provider if you should be. Meal planning, exercise, and medicines can help you reach your cholesterol targets.  SCHEDULE AND KEEP YOUR ANNUAL PHYSICAL EXAMS AND EYE EXAMS Your health care provider will tell you how often he or she wants to see you depending on your plan of treatment. It is important that you keep these appointments so that possible problems can be identified early and complications can be avoided or treated.  Every visit with your health care provider should include your weight, blood pressure, and an evaluation of your blood glucose control.  Your hemoglobin A1c should be checked:  At least twice a year if you are at your goal.  Every 3 months if there are changes in treatment.  If you are not meeting your goals.  Your blood lipids should be checked yearly. You should also be checked yearly to see if you have protein in your urine (microalbumin).  Schedule a dilated eye exam within 5 years of your diagnosis if  you have type 1 diabetes, and then yearly. Schedule a dilated eye exam at diagnosis if you have type 2 diabetes, and then yearly. All exams thereafter can be extended to every 2 to 3 years if one or more exams have been normal. KEEP YOUR VACCINES CURRENT It is  recommended that you receive a flu (influenza) vaccine every year. It is also recommended that you receive a pneumonia (pneumococcal) vaccine. If you are 72 years of age or older and have never received a pneumonia vaccine, this vaccine may be given as a series of two separate shots. Ask your health care provider which additional vaccines may be recommended. TAKE CARE OF YOUR FEET  Diabetes may cause you to have a poor blood supply (circulation) to your legs and feet. Because of this, the skin may be thinner, break easier, and heal more slowly. You also may have nerve damage in your legs and feet, causing decreased feeling. You may not notice minor injuries to your feet that could lead to serious problems or infections. Taking care of your feet is very important. Visual foot exams are performed at every routine medical visit. The exams check for cuts, injuries, or other problems with the feet. A comprehensive foot exam should be done yearly. This includes visual inspection as well as assessing foot pulses and testing for loss of sensation. You should also do the following:  Inspect your feet daily for cuts, calluses, blisters, ingrown toenails, and signs of infection, such as redness, swelling, or pus.  Wash and dry your feet thoroughly, especially between the toes.  Avoid soaking your feet regularly in hot water baths.  Moisturize dry skin with lotion, avoiding areas between your toes.  Cut toenails straight across and file the edges.  Avoid shoes that do not fit well or have areas that irritate your skin.  Avoid going barefooted or wearing only socks. Your feet need protection. TAKE CARE OF YOUR TEETH People with poorly controlled diabetes are more likely to have gum (periodontal) disease. These infections make diabetes harder to control. Periodontal diseases, if left untreated, can lead to tooth loss. Brush your teeth twice a day, floss, and see your dentist for checkups and cleaning every 6  months, or 2 times a year. ASK YOUR HEALTH CARE PROVIDER ABOUT TAKING ASPIRIN Taking aspirin daily is recommended to help prevent cardiovascular disease in people with and without diabetes. Ask your health care provider if this would benefit you and what dose he or she would recommend. DRINK RESPONSIBLY Moderate amounts of alcohol (less than 1 drink per day for adult women and less than 2 drinks per day for adult men) have a minimal effect on blood glucose if ingested with food. It is important to eat food with alcohol to avoid hypoglycemia. People should avoid alcohol if they have a history of alcohol abuse or dependence, if they are pregnant, and if they have liver disease, pancreatitis, advanced neuropathy, or severe hypertriglyceridemia. LESSEN STRESS Living with diabetes can be stressful. When you are under stress, your blood glucose may be affected in two ways:  Stress hormones may cause your blood glucose to rise.  You may be distracted from taking good care of yourself. It is a good idea to be aware of your stress level and make changes that are necessary to help you better manage challenging situations. Support groups, planned relaxation, a hobby you enjoy, meditation, healthy relationships, and exercise all work to lower your stress level. If your efforts do not seem to  be helping, get help from your health care provider or a trained mental health professional.   This information is not intended to replace advice given to you by your health care provider. Make sure you discuss any questions you have with your health care provider.   Document Released: 01/26/2011 Document Revised: 05/31/2014 Document Reviewed: 07/04/2013 Elsevier Interactive Patient Education [employer].

## 2015-10-10 ENCOUNTER — Emergency Department (HOSPITAL_COMMUNITY)
Admission: EM | Admit: 2015-10-10 | Discharge: 2015-10-10 | Disposition: A | Discharge status: Home / Self Care | Payer: BLUE CROSS/BLUE SHIELD | Attending: Emergency Medicine | Admitting: Emergency Medicine

## 2015-10-10 ENCOUNTER — Encounter (HOSPITAL_COMMUNITY): Payer: Self-pay | Admitting: Emergency Medicine

## 2015-10-10 DIAGNOSIS — E1165 Type 2 diabetes mellitus with hyperglycemia: Secondary | ICD-10-CM | POA: Diagnosis not present

## 2015-10-10 DIAGNOSIS — I252 Old myocardial infarction: Secondary | ICD-10-CM | POA: Insufficient documentation

## 2015-10-10 DIAGNOSIS — I10 Essential (primary) hypertension: Secondary | ICD-10-CM | POA: Diagnosis not present

## 2015-10-10 DIAGNOSIS — R739 Hyperglycemia, unspecified: Secondary | ICD-10-CM

## 2015-10-10 DIAGNOSIS — M545 Low back pain: Secondary | ICD-10-CM | POA: Insufficient documentation

## 2015-10-10 DIAGNOSIS — F17210 Nicotine dependence, cigarettes, uncomplicated: Secondary | ICD-10-CM | POA: Diagnosis not present

## 2015-10-10 DIAGNOSIS — Z8659 Personal history of other mental and behavioral disorders: Secondary | ICD-10-CM | POA: Insufficient documentation

## 2015-10-10 DIAGNOSIS — I2510 Atherosclerotic heart disease of native coronary artery without angina pectoris: Secondary | ICD-10-CM | POA: Diagnosis not present

## 2015-10-10 DIAGNOSIS — Z87442 Personal history of urinary calculi: Secondary | ICD-10-CM | POA: Insufficient documentation

## 2015-10-10 DIAGNOSIS — R4182 Altered mental status, unspecified: Secondary | ICD-10-CM | POA: Diagnosis not present

## 2015-10-10 DIAGNOSIS — Z79899 Other long term (current) drug therapy: Secondary | ICD-10-CM | POA: Diagnosis not present

## 2015-10-10 DIAGNOSIS — Z8701 Personal history of pneumonia (recurrent): Secondary | ICD-10-CM | POA: Insufficient documentation

## 2015-10-10 DIAGNOSIS — G4733 Obstructive sleep apnea (adult) (pediatric): Secondary | ICD-10-CM | POA: Insufficient documentation

## 2015-10-10 DIAGNOSIS — R531 Weakness: Secondary | ICD-10-CM | POA: Diagnosis present

## 2015-10-10 DIAGNOSIS — Z9861 Coronary angioplasty status: Secondary | ICD-10-CM | POA: Diagnosis not present

## 2015-10-10 DIAGNOSIS — Z794 Long term (current) use of insulin: Secondary | ICD-10-CM | POA: Insufficient documentation

## 2015-10-10 DIAGNOSIS — R404 Transient alteration of awareness: Secondary | ICD-10-CM

## 2015-10-10 DIAGNOSIS — Z9889 Other specified postprocedural states: Secondary | ICD-10-CM | POA: Diagnosis not present

## 2015-10-10 DIAGNOSIS — K219 Gastro-esophageal reflux disease without esophagitis: Secondary | ICD-10-CM | POA: Insufficient documentation

## 2015-10-10 DIAGNOSIS — Z9981 Dependence on supplemental oxygen: Secondary | ICD-10-CM | POA: Insufficient documentation

## 2015-10-10 LAB — COMPREHENSIVE METABOLIC PANEL
ALBUMIN: 3.3 g/dL — AB (ref 3.5–5.0)
ALK PHOS: 79 U/L (ref 38–126)
ALT: 41 U/L (ref 17–63)
AST: 25 U/L (ref 15–41)
Anion gap: 6 (ref 5–15)
BILIRUBIN TOTAL: 0.7 mg/dL (ref 0.3–1.2)
BUN: 11 mg/dL (ref 6–20)
CALCIUM: 8.9 mg/dL (ref 8.9–10.3)
CO2: 26 mmol/L (ref 22–32)
CREATININE: 0.96 mg/dL (ref 0.61–1.24)
Chloride: 107 mmol/L (ref 101–111)
GFR calc non Af Amer: >60 mL/min (ref >60)
GLUCOSE: 205 mg/dL — AB (ref 65–99)
Potassium: 4.4 mmol/L (ref 3.5–5.1)
SODIUM: 139 mmol/L (ref 135–145)
TOTAL PROTEIN: 6.5 g/dL (ref 6.5–8.1)

## 2015-10-10 LAB — CBC
HCT: 39 % (ref 39.0–52.0)
Hemoglobin: 12.5 g/dL — ABNORMAL LOW (ref 13.0–17.0)
MCH: 26.2 pg (ref 26.0–34.0)
MCHC: 32.1 g/dL (ref 30.0–36.0)
MCV: 81.8 fL (ref 78.0–100.0)
PLATELETS: 192 10*3/uL (ref 150–400)
RBC: 4.77 MIL/uL (ref 4.22–5.81)
RDW: 13.6 % (ref 11.5–15.5)
WBC: 6.3 10*3/uL (ref 4.0–10.5)

## 2015-10-10 NOTE — ED Notes (Signed)
At work, had a near syncopal episode, no LOC. Confusion present. On EMS arrival oriented x2 and unsteady for ambulation. CBG 202. Arrives to ED drowsy, oriented x4. Reports new medication for back pain started yesterday (Soma).

## 2015-10-10 NOTE — ED Provider Notes (Signed)
CSN: [id number]     Arrival date & time 10/10/15  0909 History   First MD Initiated Contact with Patient 10/10/15 0935     Chief Complaint  Patient presents with  . Altered Mental Status     (Consider location/radiation/quality/duration/timing/severity/associated sxs/prior Treatment) HPI   George Dickson is a 45 y.o. male presents for altered mental status, staggering and weakness at work today. He feels better since that time. He recalls the incident. He had told a coworker that he was going home, but they were concerned, so called the ambulance. He was started on soma yesterday for low back pain. He has taken 3 doses since then. He has never taken this medication previously. Using the ED 2 days ago with some similar symptoms. His PCP saw him yesterday and started the [place]. She plans on referring him to chiropractic, for further care. He is status post low back surgery with removal of a partial disc rupture. He is not currently doing PT or any rehabilitation exercises. There are no other known modifying factors.   Past Medical History  Diagnosis Date  . Stented coronary artery   . Hypertension   . Coronary artery disease JUNE 2013    inferior ST-segment  elevation  MI- DES stent -proximaL RCA  . OSA (obstructive sleep apnea) 12/06/2011    initial sleep study  completed, CPAP /BiPAP TITRATION DONE 12/20/11  . Dyslipidemia   . Pneumonia   . Anxiety   . Kidney stones   . GERD (gastroesophageal reflux disease)     occasionally takes TUMS  . Headache(784.0)   . Old myocardial infarction   . Type II diabetes mellitus ([place])     "dx'd today" (08/13/2013)   Past Surgical History  Procedure Laterality Date  . Cardiac catheterization  JUNE 1,2013    thrombectomy of 100% thrombotic  occlusion of  proximal RCA  . Coronary angioplasty  10/23/2011    PCI - proximal  RCA with  Promus ElEMENT  des 3.0 mm x 46mm ; final diameter 3.21mm distal, 3.35 mm proximal   . Doppler echocardiography  10/25/2011     EF [id number] diastolic function normal ,systolic function normal  . Shoulder arthroscopy Right 1992  . Lumbar laminectomy/decompression microdiscectomy Right 08/17/2013    Procedure: LUMBAR FOUR TO FIVE LUMBAR LAMINECTOMY/DECOMPRESSION MICRODISCECTOMY 1 LEVEL;  Surgeon: Charlie Pitter, MD;  Location: [place] [facility];  Service: Neurosurgery;  Laterality: Right;  . Left heart catheterization with coronary angiogram N/A 10/23/2011    Procedure: LEFT HEART CATHETERIZATION WITH CORONARY ANGIOGRAM;  Surgeon: [name], MD;  Location: [facility] CATH LAB;  Service: Cardiovascular;  Laterality: N/A;  . Percutaneous coronary stent intervention (pci-s)  10/23/2011    Procedure: PERCUTANEOUS CORONARY STENT INTERVENTION (PCI-S);  Surgeon: [name], MD;  Location: [facility] CATH LAB;  Service: Cardiovascular;;   Family History  Problem Relation Age of Onset  . Coronary artery disease Mother   . Hypertension Mother   . Diabetes type II Mother    Social History  Substance Use Topics  . Smoking status: Current Every Day Smoker -- 0.25 packs/day for 4 years    Types: Cigars, Cigarettes  . Smokeless tobacco: Never Used     Comment: trying to quit - has not had any cigarettes or cigars for 3 weeks  . Alcohol Use: No    Review of Systems  All other systems reviewed and are negative.     Allergies  Review of patient's allergies indicates no known allergies.  Home Medications   Prior to Admission medications   Medication Sig Start Date End Date Taking? Authorizing Provider  acetaminophen (TYLENOL) 500 MG tablet Take 1,000 mg by mouth every 6 (six) hours as needed (pain).   Yes Historical Provider, MD  carisoprodol (SOMA) 350 MG tablet Take 350 mg by mouth every 8 (eight) hours.   Yes Historical Provider, MD  ibuprofen (ADVIL,MOTRIN) 200 MG tablet Take 800 mg by mouth every 6 (six) hours as needed (pain).    Yes Historical Provider, MD  indomethacin (INDOCIN) 50 MG capsule Take 50 mg by mouth 3 (three)  times daily with meals. Started 10/09/15 - take for 14 days   Yes Historical Provider, MD  Lewis 1 patch onto the skin daily as needed (back pain).   Yes Historical Provider, MD  PRESCRIPTION MEDICATION Apply 1 application topically at bedtime as needed (back pain). Sample from drug rep   Yes Historical Provider, MD  cyclobenzaprine (FLEXERIL) 10 MG tablet Take 1 tablet (10 mg total) by mouth 3 (three) times daily as needed for muscle spasms. Patient not taking: Reported on 03/30/2015 08/17/13   Earnie Larsson, MD  docusate sodium (COLACE) 100 MG capsule Take 1 capsule (100 mg total) by mouth every 12 (twelve) hours. Patient not taking: Reported on 10/10/2015 03/30/15   Mercedes Camprubi-Soms, PA-C  docusate sodium 100 MG CAPS Take 100 mg by mouth 2 (two) times daily. Patient not taking: Reported on 03/30/2015 08/15/13   Melton Alar, PA-C  HYDROcodone-acetaminophen (NORCO/VICODIN) 5-325 MG per tablet Take 1-2 tablets by mouth every 4 (four) hours as needed for moderate pain. Patient not taking: Reported on 03/30/2015 08/17/13   Earnie Larsson, MD  Insulin Glargine (LANTUS) 100 UNIT/ML Solostar Pen Inject 30 Units into the skin daily. Patient not taking: Reported on 03/30/2015 08/15/13   Melton Alar, PA-C  insulin lispro (HUMALOG) 100 UNIT/ML KiwkPen Inject 8 Units into the skin 3 (three) times daily with meals. Patient not taking: Reported on 03/30/2015 08/15/13   Bobby Rumpf York, PA-C  NEEDLE, DISP, 30 G (B-D DISP NEEDLE 30GX1") 30G X 1" MISC 1 Units by Does not apply route 4 (four) times daily -  before meals and at bedtime. Patient not taking: Reported on 10/10/2015 08/15/13   Melton Alar, PA-C  polyethylene glycol (MIRALAX / GLYCOLAX) packet Take 17 g by mouth daily. Patient not taking: Reported on 10/10/2015 03/30/15   Mercedes Camprubi-Soms, PA-C   BP 125/69 mmHg  Pulse 53  Temp(Src) 98.3 F (36.8 C) (Oral)  Resp 16  SpO2 99% Physical Exam  Constitutional: He is  oriented to person, place, and time. He appears well-developed and well-nourished.  HENT:  Head: Normocephalic and atraumatic.  Right Ear: External ear normal.  Left Ear: External ear normal.  Eyes: Conjunctivae and EOM are normal. Pupils are equal, round, and reactive to light.  Neck: Normal range of motion and phonation normal. Neck supple.  Cardiovascular: Normal rate, regular rhythm and normal heart sounds.   Pulmonary/Chest: Effort normal and breath sounds normal. He exhibits no bony tenderness.  Abdominal: Soft. There is no tenderness.  Musculoskeletal: Normal range of motion.  Mild right lower back tenderness to palpation, with painful, but not limited range of motion.  Neurological: He is alert and oriented to person, place, and time. No cranial nerve deficit or sensory deficit. He exhibits normal muscle tone. Coordination normal.  No dysarthria and aphasia or nystagmus.  Skin: Skin is warm, dry and intact.  Psychiatric: He has a normal mood and affect. His behavior is normal. Judgment and thought content normal.  Nursing note and vitals reviewed.   ED Course  Procedures (including critical care time)  Medications - No data to display  Patient Vitals for the past 24 hrs:  BP Temp Temp src Pulse Resp SpO2  10/10/15 1030 125/69 mmHg - - (!) 53 16 99 %  10/10/15 1000 127/63 mmHg - - (!) 54 22 100 %  10/10/15 0919 145/90 mmHg 98.3 F (36.8 C) Oral (!) 52 16 100 %  10/10/15 0913 - - - - - 99 %    10:57 AM Reevaluation with update and discussion. After initial assessment and treatment, an updated evaluation reveals No change in complaints. Findings discussed with patient and his mother, all questions were answered. Aveer Bartow L    Labs Review Labs Reviewed  COMPREHENSIVE METABOLIC PANEL - Abnormal; Notable for the following:    Glucose, Bld 205 (*)    Albumin 3.3 (*)    All other components within normal limits  CBC - Abnormal; Notable for the following:    Hemoglobin  12.5 (*)    All other components within normal limits    Imaging Review No results found. I have personally reviewed and evaluated these images and lab results as part of my medical decision-making.   EKG Interpretation   Date/Time:  Friday Oct 10 2015 09:32:37 EDT Ventricular Rate:  54 PR Interval:  194 QRS Duration: 112 QT Interval:  417 QTC Calculation: 395 R Axis:   -4 Text Interpretation:  Sinus rhythm Borderline intraventricular conduction  delay RSR' in V1 or V2, right VCD or RVH Borderline ST elevation, anterior  leads Since last tracing rate slower Confirmed by [facility]  MD, Quy Lotts  [id number]) on 10/10/2015 9:38:09 AM      MDM   Final diagnoses:  Transient alteration of awareness    Altered mental status associated with initiation of a new sedating medicine, taken for low back discomfort. Incidental hyperglycemia is noted. The patient is aware of the hyperglycemia and is working with his PCP about it. Doubt spinal myelopathy, acute CNS infection, serious bacterial infection or metabolic instability.  Nursing Notes Reviewed/ Care Coordinated Applicable Imaging Reviewed Interpretation of Laboratory Data incorporated into ED treatment  The patient appears reasonably screened and/or stabilized for discharge and I doubt any other medical condition or other [facility] requiring further screening, evaluation, or treatment in the ED at this time prior to discharge.  Plan: Home Medications- usual; Home Treatments- rest; return here if the recommended treatment, does not improve the symptoms; Recommended follow up- PCP 1 week. Do not take Soma before going to work, if it continues to make him sleepy.     Daleen Bo, MD 10/10/15 1059

## 2015-10-10 NOTE — Discharge Instructions (Signed)
Get plenty of rest and drink a lot of fluids.  Avoid taking the Soma before going to work if it makes you sleepy.  Continue to work with your PCP for treatment of the back pain.

## 2015-10-13 ENCOUNTER — Encounter (HOSPITAL_BASED_OUTPATIENT_CLINIC_OR_DEPARTMENT_OTHER): Payer: Self-pay | Admitting: Emergency Medicine

## 2016-03-20 ENCOUNTER — Encounter (HOSPITAL_COMMUNITY): Payer: Self-pay | Admitting: Family Medicine

## 2016-03-20 ENCOUNTER — Ambulatory Visit (HOSPITAL_COMMUNITY)
Admission: EM | Admit: 2016-03-20 | Discharge: 2016-03-20 | Disposition: A | Discharge status: Home / Self Care | Payer: BLUE CROSS/BLUE SHIELD | Attending: Family Medicine | Admitting: Family Medicine

## 2016-03-20 DIAGNOSIS — L03011 Cellulitis of right finger: Secondary | ICD-10-CM

## 2016-03-20 MED ORDER — HYDROCODONE-ACETAMINOPHEN 5-325 MG PO TABS: 1.0000 | ORAL_TABLET | Freq: Four times a day (QID) | ORAL | 0 refills | Status: DC | PRN

## 2016-03-20 MED ORDER — DOXYCYCLINE HYCLATE 100 MG PO TABS: 100.0000 mg | ORAL_TABLET | Freq: Two times a day (BID) | ORAL | 0 refills | Status: DC

## 2016-03-20 NOTE — ED Provider Notes (Signed)
[place]    CSN: [id number] Arrival date & time: 03/20/16  1752     History   Chief Complaint No chief complaint on file.   HPI George Dickson is a 45 y.o. male.   This is a 45 year old diabetic male who presents with an infected right middle finger. He's been manipulating the tissue on the side of the cuticle (ulnar side) sees but the finger pain is starting get worse.  The onset of this problem was about 5 days ago.      Past Medical History:  Diagnosis Date  . Anxiety   . Coronary artery disease JUNE 2013   inferior ST-segment  elevation  MI- DES stent -proximaL RCA  . Dyslipidemia   . GERD (gastroesophageal reflux disease)    occasionally takes TUMS  . Headache(784.0)   . Hypertension   . Kidney stones   . Old myocardial infarction   . OSA (obstructive sleep apnea) 12/06/2011   initial sleep study  completed, CPAP /BiPAP TITRATION DONE 12/20/11  . Pneumonia   . Stented coronary artery   . Type II diabetes mellitus ([place])    "dx'd today" (08/13/2013)    Patient Active Problem List   Diagnosis Date Noted  . HNP (herniated nucleus pulposus), lumbar 08/17/2013  . 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 DM (diabetes mellitus) ([place]) 08/15/2013  . Hyperglycemia 08/13/2013  . Hyperglycemic hyperosmolar nonketotic state 08/13/2013  . HTN (hypertension) 08/13/2013  . Old myocardial infarction   . Presence of drug coated stent in right coronary artery - Promus 3.0 mm x 24 mm - post dilated to 3.3 mm  10/24/2011    Class: Acute  . Tobacco use 10/23/2011  . STEMI (ST elevation myocardial infarction) ([place]) 10/23/2011  . Obesity, Class II, BMI 35-39.9 10/23/2011  . Coronary artery disease 10/23/2011    Past Surgical History:  Procedure Laterality Date  . CARDIAC CATHETERIZATION  JUNE 1,2013   thrombectomy of 100% thrombotic  occlusion of  proximal RCA  . CORONARY ANGIOPLASTY  10/23/2011   PCI - proximal  RCA with  Promus ElEMENT  des 3.0  mm x 49mm ; final diameter 3.25mm distal, 3.35 mm proximal   . DOPPLER ECHOCARDIOGRAPHY  10/25/2011   EF [id number] diastolic function normal ,systolic function normal  . LEFT HEART CATHETERIZATION WITH CORONARY ANGIOGRAM N/A 10/23/2011   Procedure: LEFT HEART CATHETERIZATION WITH CORONARY ANGIOGRAM;  Surgeon: [name], MD;  Location[facility] CATH LAB;  Service: Cardiovascular;  Laterality: N/A;  . LUMBAR LAMINECTOMY/DECOMPRESSION MICRODISCECTOMY Right 08/17/2013   Procedure: LUMBAR FOUR TO FIVE LUMBAR LAMINECTOMY/DECOMPRESSION MICRODISCECTOMY 1 LEVEL;  Surgeon: Charlie Pitter, MD;  Location: [place] NEURO ORS;  Service: Neurosurgery;  Laterality: Right;  . PERCUTANEOUS CORONARY STENT INTERVENTION (PCI-S)  10/23/2011   Procedure: PERCUTANEOUS CORONARY STENT INTERVENTION (PCI-S);  Surgeon: [name], MD;  Location: [facility] CATH LAB;  Service: Cardiovascular;;  . SHOULDER ARTHROSCOPY Right 1992       Home Medications    Prior to Admission medications   Medication Sig Start Date End Date Taking? Authorizing Provider  acetaminophen (TYLENOL) 500 MG tablet Take 1,000 mg by mouth every 6 (six) hours as needed (pain).    Historical Provider, MD  carisoprodol (SOMA) 350 MG tablet Take 350 mg by mouth every 8 (eight) hours.    Historical Provider, MD  cyclobenzaprine (FLEXERIL) 10 MG tablet Take 1 tablet (10 mg total) by mouth 3 (three) times daily as needed for muscle spasms. Patient not taking:  Reported on 03/30/2015 08/17/13   Earnie Larsson, MD  docusate sodium (COLACE) 100 MG capsule Take 1 capsule (100 mg total) by mouth every 12 (twelve) hours. Patient not taking: Reported on 10/10/2015 03/30/15   Mercedes Camprubi-Soms, PA-C  docusate sodium 100 MG CAPS Take 100 mg by mouth 2 (two) times daily. Patient not taking: Reported on 03/30/2015 08/15/13   Melton Alar, PA-C  HYDROcodone-acetaminophen (NORCO/VICODIN) 5-325 MG per tablet Take 1-2 tablets by mouth every 4 (four) hours as needed for moderate  pain. Patient not taking: Reported on 03/30/2015 08/17/13   Earnie Larsson, MD  ibuprofen (ADVIL,MOTRIN) 200 MG tablet Take 800 mg by mouth every 6 (six) hours as needed (pain).     Historical Provider, MD  indomethacin (INDOCIN) 50 MG capsule Take 50 mg by mouth 3 (three) times daily with meals. Started 10/09/15 - take for 14 days    Historical Provider, MD  Insulin Glargine (LANTUS) 100 UNIT/ML Solostar Pen Inject 30 Units into the skin daily. Patient not taking: Reported on 03/30/2015 08/15/13   Melton Alar, PA-C  insulin lispro (HUMALOG) 100 UNIT/ML KiwkPen Inject 8 Units into the skin 3 (three) times daily with meals. Patient not taking: Reported on 03/30/2015 08/15/13   Bobby Rumpf York, PA-C  NEEDLE, DISP, 30 G (B-D DISP NEEDLE 30GX1") 30G X 1" MISC 1 Units by Does not apply route 4 (four) times daily -  before meals and at bedtime. Patient not taking: Reported on 10/10/2015 08/15/13   Melton Alar, PA-C  OVER THE COUNTER MEDICATION Place 1 patch onto the skin daily as needed (back pain).    Historical Provider, MD  polyethylene glycol (MIRALAX / GLYCOLAX) packet Take 17 g by mouth daily. Patient not taking: Reported on 10/10/2015 03/30/15   Mercedes Camprubi-Soms, PA-C  PRESCRIPTION MEDICATION Apply 1 application topically at bedtime as needed (back pain). Sample from drug rep    Historical Provider, MD    Family History Family History  Problem Relation Age of Onset  . Coronary artery disease Mother   . Hypertension Mother   . Diabetes type II Mother     Social History Social History  Substance Use Topics  . Smoking status: Current Every Day Smoker    Packs/day: 0.25    Years: 4.00    Types: Cigars, Cigarettes  . Smokeless tobacco: Never Used     Comment: trying to quit - has not had any cigarettes or cigars for 3 weeks  . Alcohol use No     Allergies   Review of patient's allergies indicates no known allergies.   Review of Systems Review of Systems  Constitutional:  Negative.   HENT: Negative.   Eyes: Negative.   Respiratory: Negative.   Cardiovascular: Negative.   Gastrointestinal: Negative.   Genitourinary: Negative.      Physical Exam Triage Vital Signs ED Triage Vitals [03/20/16 1803]  Enc Vitals Group     BP 139/74     Pulse Rate 83     Resp 16     Temp 97.4 F (36.3 C)     Temp Source Oral     SpO2 97 %     Weight      Height      Head Circumference      Peak Flow      Pain Score      Pain Loc      Pain Edu?      Excl. in [place]?    No data found.  Updated Vital Signs BP 139/74 (BP Location: Left Arm)   Pulse 83   Temp 97.4 F (36.3 C) (Oral)   Resp 16   SpO2 97%    Physical Exam  Constitutional: He is oriented to person, place, and time. He appears well-developed and well-nourished.  HENT:  Head: Normocephalic.  Right Ear: External ear normal.  Left Ear: External ear normal.  Mouth/Throat: Oropharynx is clear and moist.  Eyes: Conjunctivae and EOM are normal. Pupils are equal, round, and reactive to light.  Neck: Normal range of motion. Neck supple.  Musculoskeletal: Normal range of motion.  The ulnar side of the right middle finger is mildly swollen with no obvious purulent collection. The volar aspect of the distal phalanx slightly erythematous without swelling. There is some tenderness diffusely along the ulnar side of the nail.  Range of motion of the finger is normal.  Neurological: He is alert and oriented to person, place, and time. He has normal reflexes.  Nursing note and vitals reviewed.    UC Treatments / Results  Labs (all labs ordered are listed, but only abnormal results are displayed) Labs Reviewed - No data to display  EKG  EKG Interpretation None       Radiology No results found.  Procedures Procedures (including critical care time)  Medications Ordered in UC Medications - No data to display   Initial Impression / Assessment and Plan / UC Course  I have reviewed the triage  vital signs and the nursing notes.  Pertinent labs & imaging results that were available during my care of the patient were reviewed by me and considered in my medical decision making (see chart for details).  Clinical Course    Final Clinical Impressions(s) / UC Diagnoses   Final diagnoses:  None    New Prescriptions New Prescriptions   No medications on file     Robyn Haber, MD 03/20/16 [place]

## 2016-03-20 NOTE — Discharge Instructions (Signed)
Please return if pain or swelling increases over the next 48 hours.

## 2016-03-20 NOTE — ED Triage Notes (Signed)
Pt  Had   A  Hang  naul  r   Middle  Finger     Pt  States  It  Has  Been  Festering

## 2016-05-04 ENCOUNTER — Emergency Department (HOSPITAL_COMMUNITY)
Admission: EM | Admit: 2016-05-04 | Discharge: 2016-05-04 | Disposition: A | Discharge status: Home / Self Care | Payer: Worker's Compensation | Attending: Emergency Medicine | Admitting: Emergency Medicine

## 2016-05-04 ENCOUNTER — Encounter (HOSPITAL_COMMUNITY): Payer: Self-pay

## 2016-05-04 ENCOUNTER — Emergency Department (HOSPITAL_COMMUNITY): Payer: Worker's Compensation

## 2016-05-04 DIAGNOSIS — Y9389 Activity, other specified: Secondary | ICD-10-CM | POA: Insufficient documentation

## 2016-05-04 DIAGNOSIS — Y990 Civilian activity done for income or pay: Secondary | ICD-10-CM | POA: Insufficient documentation

## 2016-05-04 DIAGNOSIS — M5127 Other intervertebral disc displacement, lumbosacral region: Secondary | ICD-10-CM | POA: Diagnosis not present

## 2016-05-04 DIAGNOSIS — I10 Essential (primary) hypertension: Secondary | ICD-10-CM | POA: Insufficient documentation

## 2016-05-04 DIAGNOSIS — I2510 Atherosclerotic heart disease of native coronary artery without angina pectoris: Secondary | ICD-10-CM | POA: Insufficient documentation

## 2016-05-04 DIAGNOSIS — E119 Type 2 diabetes mellitus without complications: Secondary | ICD-10-CM | POA: Diagnosis not present

## 2016-05-04 DIAGNOSIS — Z955 Presence of coronary angioplasty implant and graft: Secondary | ICD-10-CM | POA: Insufficient documentation

## 2016-05-04 DIAGNOSIS — F17290 Nicotine dependence, other tobacco product, uncomplicated: Secondary | ICD-10-CM | POA: Insufficient documentation

## 2016-05-04 DIAGNOSIS — Y929 Unspecified place or not applicable: Secondary | ICD-10-CM | POA: Diagnosis not present

## 2016-05-04 DIAGNOSIS — I252 Old myocardial infarction: Secondary | ICD-10-CM | POA: Insufficient documentation

## 2016-05-04 DIAGNOSIS — M545 Low back pain, unspecified: Secondary | ICD-10-CM

## 2016-05-04 DIAGNOSIS — X500XXA Overexertion from strenuous movement or load, initial encounter: Secondary | ICD-10-CM | POA: Diagnosis not present

## 2016-05-04 DIAGNOSIS — M549 Dorsalgia, unspecified: Secondary | ICD-10-CM

## 2016-05-04 MED ORDER — PREDNISONE 10 MG PO TABS: 40.0000 mg | ORAL_TABLET | Freq: Every day | ORAL | 0 refills | Status: AC

## 2016-05-04 MED ORDER — DEXAMETHASONE SODIUM PHOSPHATE 10 MG/ML IJ SOLN
10.0000 mg | Freq: Once | INTRAMUSCULAR | Status: AC
  Administered 2016-05-04: 10 mg via INTRAVENOUS
  Filled 2016-05-04: qty 1

## 2016-05-04 MED ORDER — CYCLOBENZAPRINE HCL 10 MG PO TABS: 10.0000 mg | ORAL_TABLET | Freq: Two times a day (BID) | ORAL | 0 refills | Status: DC | PRN

## 2016-05-04 MED ORDER — MORPHINE SULFATE (PF) 4 MG/ML IV SOLN
4.0000 mg | Freq: Once | INTRAVENOUS | Status: AC
  Administered 2016-05-04: 4 mg via INTRAVENOUS
  Filled 2016-05-04: qty 1

## 2016-05-04 MED ORDER — LORAZEPAM 2 MG/ML IJ SOLN
1.0000 mg | Freq: Two times a day (BID) | INTRAMUSCULAR | Status: DC | PRN
  Administered 2016-05-04: 1 mg via INTRAVENOUS
  Filled 2016-05-04: qty 1

## 2016-05-04 MED ORDER — CYCLOBENZAPRINE HCL 10 MG PO TABS
10.0000 mg | ORAL_TABLET | Freq: Once | ORAL | Status: AC
  Administered 2016-05-04: 10 mg via ORAL
  Filled 2016-05-04: qty 1

## 2016-05-04 MED ORDER — OXYCODONE HCL 5 MG PO TABS: 5.0000 mg | ORAL_TABLET | ORAL | 0 refills | Status: DC | PRN

## 2016-05-04 MED ORDER — OXYCODONE HCL 5 MG PO TABS
5.0000 mg | ORAL_TABLET | Freq: Once | ORAL | Status: AC
  Administered 2016-05-04: 5 mg via ORAL
  Filled 2016-05-04: qty 1

## 2016-05-04 MED ORDER — KETOROLAC TROMETHAMINE 15 MG/ML IJ SOLN
15.0000 mg | Freq: Once | INTRAMUSCULAR | Status: AC
  Administered 2016-05-04: 15 mg via INTRAVENOUS
  Filled 2016-05-04: qty 1

## 2016-05-04 NOTE — ED Provider Notes (Signed)
[place] DEPT Provider Note   CSN: [id number] Arrival date & time: 05/04/16  0744     History   Chief Complaint Chief Complaint  Patient presents with  . Back Pain    HPI George Dickson is a 45 y.o. male.  HPI   Left lower lumbar back pain while lifting pallet at work Numbness LLE and bilateral lower extremity weakness Similar pain prior to past surgery on right side Severe pain, 10/10  Denies loss bowel bladder control No fevers, no IVDU  Past Medical History:  Diagnosis Date  . Anxiety   . Coronary artery disease JUNE 2013   inferior ST-segment  elevation  MI- DES stent -proximaL RCA  . Dyslipidemia   . GERD (gastroesophageal reflux disease)    occasionally takes TUMS  . Headache(784.0)   . Hypertension   . Kidney stones   . Old myocardial infarction   . OSA (obstructive sleep apnea) 12/06/2011   initial sleep study  completed, CPAP /BiPAP TITRATION DONE 12/20/11  . Pneumonia   . Stented coronary artery   . Type II diabetes mellitus ([place])    "dx'd today" (08/13/2013)    Patient Active Problem List   Diagnosis Date Noted  . HNP (herniated nucleus pulposus), lumbar 08/17/2013  . 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 DM (diabetes mellitus) ([place]) 08/15/2013  . Hyperglycemia 08/13/2013  . Hyperglycemic hyperosmolar nonketotic state 08/13/2013  . HTN (hypertension) 08/13/2013  . Old myocardial infarction   . Presence of drug coated stent in right coronary artery - Promus 3.0 mm x 24 mm - post dilated to 3.3 mm  10/24/2011    Class: Acute  . Tobacco use 10/23/2011  . STEMI (ST elevation myocardial infarction) ([place]) 10/23/2011  . Obesity, Class II, BMI 35-39.9 10/23/2011  . Coronary artery disease 10/23/2011    Past Surgical History:  Procedure Laterality Date  . CARDIAC CATHETERIZATION  JUNE 1,2013   thrombectomy of 100% thrombotic  occlusion of  proximal RCA  . CORONARY ANGIOPLASTY  10/23/2011   PCI - proximal  RCA with  Promus  ElEMENT  des 3.0 mm x 56mm ; final diameter 3.65mm distal, 3.35 mm proximal   . DOPPLER ECHOCARDIOGRAPHY  10/25/2011   EF [id number] diastolic function normal ,systolic function normal  . LEFT HEART CATHETERIZATION WITH CORONARY ANGIOGRAM N/A 10/23/2011   Procedure: LEFT HEART CATHETERIZATION WITH CORONARY ANGIOGRAM;  Surgeon: [name], MD;  [facility];  Service: Cardiovascular;  Laterality: N/A;  . LUMBAR LAMINECTOMY/DECOMPRESSION MICRODISCECTOMY Right 08/17/2013   Procedure: LUMBAR FOUR TO FIVE LUMBAR LAMINECTOMY/DECOMPRESSION MICRODISCECTOMY 1 LEVEL;  Surgeon: Charlie Pitter, MD;  Location: [place] NEURO ORS;  Service: Neurosurgery;  Laterality: Right;  . PERCUTANEOUS CORONARY STENT INTERVENTION (PCI-S)  10/23/2011   Procedure: PERCUTANEOUS CORONARY STENT INTERVENTION (PCI-S);  Surgeon: [name], MD;  Location: [facility] C[facility] LAB;  Service: Cardiovascular;;  . SHOULDER ARTHROSCOPY Right 1992       Home Medications    Prior to Admission medications   Medication Sig Start Date End Date Taking? Authorizing Provider  cyclobenzaprine (FLEXERIL) 10 MG tablet Take 1 tablet (10 mg total) by mouth 2 (two) times daily as needed for muscle spasms. 05/04/16   Gareth Morgan, MD  doxycycline (VIBRA-TABS) 100 MG tablet Take 1 tablet (100 mg total) by mouth 2 (two) times daily. Patient not taking: Reported on 05/04/2016 03/20/16   Robyn Haber, MD  HYDROcodone-acetaminophen [facility]) 5-325 MG tablet Take 1 tablet by mouth every 6 (six) hours as  needed for moderate pain. Patient not taking: Reported on 05/04/2016 03/20/16   Robyn Haber, MD  oxyCODONE (ROXICODONE) 5 MG immediate release tablet Take 1 tablet (5 mg total) by mouth every 4 (four) hours as needed for severe pain. 05/04/16   Gareth Morgan, MD  predniSONE (DELTASONE) 10 MG tablet Take 4 tablets (40 mg total) by mouth daily. 05/05/16 05/10/16  Gareth Morgan, MD    Family History Family History  Problem Relation Age of Onset  .  Coronary artery disease Mother   . Hypertension Mother   . Diabetes type II Mother     Social History Social History  Substance Use Topics  . Smoking status: Current Every Day Smoker    Packs/day: 0.25    Years: 4.00    Types: Cigars, Cigarettes  . Smokeless tobacco: Never Used     Comment: trying to quit - has not had any cigarettes or cigars for 3 weeks  . Alcohol use No     Allergies   Patient has no known allergies.   Review of Systems Review of Systems  Constitutional: Negative for fever.  HENT: Negative for sore throat.   Eyes: Negative for visual disturbance.  Respiratory: Negative for shortness of breath.   Cardiovascular: Negative for chest pain.  Gastrointestinal: Negative for abdominal pain.  Genitourinary: Negative for difficulty urinating.  Musculoskeletal: Positive for back pain. Negative for neck stiffness.  Skin: Negative for rash.  Neurological: Positive for weakness and numbness. Negative for syncope and headaches.     Physical Exam Updated Vital Signs BP 135/83   Pulse 67   Temp 99.3 F (37.4 C) (Oral)   Resp 18   SpO2 97%   Physical Exam  Constitutional: He is oriented to person, place, and time. He appears well-developed and well-nourished. No distress.  HENT:  Head: Normocephalic and atraumatic.  Eyes: Conjunctivae and EOM are normal.  Neck: Normal range of motion.  Cardiovascular: Normal rate, regular rhythm, normal heart sounds and intact distal pulses.  Exam reveals no gallop and no friction rub.   No murmur heard. Pulmonary/Chest: Effort normal and breath sounds normal. No respiratory distress. He has no wheezes. He has no rales.  Abdominal: Soft. He exhibits no distension. There is no tenderness. There is no guarding.  Musculoskeletal: He exhibits no edema.       Lumbar back: He exhibits tenderness and bony tenderness.  Neurological: He is alert and oriented to person, place, and time. GCS eye subscore is 4. GCS verbal subscore is  5. GCS motor subscore is 6.  RLE knee extension weakness, likely secondary to pain LLE weakness with dorsi/plantar flexion, great toe extension Numbness bottom of left foot   Skin: Skin is warm and dry. He is not diaphoretic.  Nursing note and vitals reviewed.    ED Treatments / Results  Labs (all labs ordered are listed, but only abnormal results are displayed) Labs Reviewed - No data to display  EKG  EKG Interpretation None       Radiology Mr Lumbar Spine Wo Contrast  Result Date: 05/04/2016 CLINICAL DATA:  Onset of severe low back and left leg pain after a lifting injury this morning. EXAM: MRI LUMBAR SPINE WITHOUT CONTRAST TECHNIQUE: Multiplanar, multisequence MR imaging of the lumbar spine was performed. No intravenous contrast was administered. COMPARISON:  MRI lumbar spine 08/05/2013. FINDINGS: Segmentation:  Standard. Alignment:  Maintained. Vertebrae: No fracture or worrisome lesion. A few small Schmorl's nodes are noted. Conus medullaris: Extends to the L1 level and  appears normal. Paraspinal and other soft tissues: Negative. Disc levels: T11-12 and T12-L1 are imaged in the sagittal plane only and negative. L1-2:  Negative. L2-3:  Negative. L3-4: Shallow broad-based central protrusion without central canal or foraminal stenosis. L4-5: The patient has undergone right laminotomy since the prior examination. There is a diffuse broad-based disc bulge with a large down turning left paracentral and subarticular recess protrusion. The disc protrusion impinges on the descending left L5 and S1 roots. Moderate bilateral foraminal narrowing appears worse on the right. L5-S1: Right paracentral annular fissure and a shallow protrusion without central canal stenosis. Bulging disc and facet degenerative change causes marked right foraminal narrowing, unchanged. Mild to moderate left foraminal narrowing is also seen. IMPRESSION: Status post right laminotomy at L4-5. There is a diffuse broad-based  disc bulge and superimposed down turning left paracentral and subarticular recess protrusion at L4-5. The protrusion impinges on the descending left L5 and S1 roots. Mild to moderate foraminal narrowing at this level is worse on the right. Moderately severe right foraminal narrowing at L5-S1 is not notably changed. Annular fissure and shallow right paracentral protrusion without central canal stenosis are seen at this level Electronically Signed   By: Inge Rise M.D.   On: 05/04/2016 10:35    Procedures Procedures (including critical care time)  Medications Ordered in ED Medications  morphine 4 MG/ML injection 4 mg (4 mg Intravenous Given 05/04/16 0828)  cyclobenzaprine (FLEXERIL) tablet 10 mg (10 mg Oral Given 05/04/16 0829)  dexamethasone (DECADRON) injection 10 mg (10 mg Intravenous Given 05/04/16 0826)  oxyCODONE (Oxy IR/ROXICODONE) immediate release tablet 5 mg (5 mg Oral Given 05/04/16 1117)  ketorolac (TORADOL) 15 MG/ML injection 15 mg (15 mg Intravenous Given 05/04/16 1118)     Initial Impression / Assessment and Plan / ED Course  I have reviewed the triage vital signs and the nursing notes.  Pertinent labs & imaging results that were available during my care of the patient were reviewed by me and considered in my medical decision making (see chart for details).  Clinical Course    45 year old male with a history of laminectomy, and discectomy with Dr. Verl Dicker 2015 presents with sudden onset left lower extremity weakness and numbness with back pain after lifting up a pallet today.   Patient without loss of control of his bowel or his bladder. No fevers, no IVDU.  He does have weakness in his left lower extremity, an MRI was ordered for further evaluation. MRI shows disc bulging impinging on the left L5 and S1 nerve roots. Discussed with Dr. Sharmaine Base who recommends close outpatient follow up with Dr. Annette Stable.   Gave steroids, discussed risks of steroids, risks of opiates. Patient  discharged in stable condition with understanding of reasons to return.   Final Clinical Impressions(s) / ED Diagnoses   Final diagnoses:  Back pain  Acute midline low back pain without sciatica  Herniation of intervertebral disc between L5 and S1    New Prescriptions Discharge Medication List as of 05/04/2016 11:20 AM    START taking these medications   Details  cyclobenzaprine (FLEXERIL) 10 MG tablet Take 1 tablet (10 mg total) by mouth 2 (two) times daily as needed for muscle spasms., Starting Tue 05/04/2016, Print    oxyCODONE (ROXICODONE) 5 MG immediate release tablet Take 1 tablet (5 mg total) by mouth every 4 (four) hours as needed for severe pain., Starting Tue 05/04/2016, Print    predniSONE (DELTASONE) 10 MG tablet Take 4 tablets (40 mg total) by  mouth daily., Starting Wed 05/05/2016, Until Mon 05/10/2016, Print         Gareth Morgan, MD 05/04/16 2238

## 2016-05-04 NOTE — Discharge Instructions (Signed)
Take Tylenol 1000 mg every 6 hours, and ibuprofen 400 mg every 6 hours for pain, using flexeril 10mg  every 8 hours as needed for muscle spasms and oxycodone for breakthrough pain.

## 2016-05-04 NOTE — ED Triage Notes (Signed)
[facility]- pt has left lower back pain. He felt something pop when bending over to pick up a pallet. No current meds. Hx of back pain. Pain is 8/10.

## 2016-05-13 ENCOUNTER — Other Ambulatory Visit: Payer: Self-pay | Admitting: Neurosurgery

## 2016-05-13 DIAGNOSIS — M5127 Other intervertebral disc displacement, lumbosacral region: Secondary | ICD-10-CM

## 2016-05-19 ENCOUNTER — Other Ambulatory Visit: Payer: Self-pay | Admitting: Neurosurgery

## 2016-05-19 ENCOUNTER — Ambulatory Visit
Admission: RE | Admit: 2016-05-19 | Discharge: 2016-05-19 | Disposition: A | Discharge status: Home / Self Care | Payer: Worker's Compensation | Source: Ambulatory Visit | Attending: Neurosurgery | Admitting: Neurosurgery

## 2016-05-19 DIAGNOSIS — M5127 Other intervertebral disc displacement, lumbosacral region: Secondary | ICD-10-CM

## 2016-05-19 MED ORDER — METHYLPREDNISOLONE ACETATE 40 MG/ML INJ SUSP (RADIOLOG
120.0000 mg | Freq: Once | INTRAMUSCULAR | Status: AC
  Administered 2016-05-19: 120 mg via EPIDURAL

## 2016-05-19 MED ORDER — IOPAMIDOL (ISOVUE-M 200) INJECTION 41%
1.0000 mL | Freq: Once | INTRAMUSCULAR | Status: AC
Start: 2016-05-19 — End: 2016-05-19
  Administered 2016-05-19: 1 mL via EPIDURAL

## 2016-05-19 NOTE — Discharge Instructions (Signed)

## 2016-08-24 ENCOUNTER — Other Ambulatory Visit: Payer: Self-pay | Admitting: Neurosurgery

## 2016-08-24 DIAGNOSIS — M5126 Other intervertebral disc displacement, lumbar region: Secondary | ICD-10-CM

## 2016-09-01 ENCOUNTER — Ambulatory Visit
Admission: RE | Admit: 2016-09-01 | Discharge: 2016-09-01 | Disposition: A | Discharge status: Home / Self Care | Payer: Worker's Compensation | Source: Ambulatory Visit | Attending: Neurosurgery | Admitting: Neurosurgery

## 2016-09-01 VITALS — Ht 76.0 in | Wt 243.0 lb

## 2016-09-01 DIAGNOSIS — M5116 Intervertebral disc disorders with radiculopathy, lumbar region: Secondary | ICD-10-CM

## 2016-09-01 DIAGNOSIS — M5126 Other intervertebral disc displacement, lumbar region: Secondary | ICD-10-CM

## 2016-09-01 MED ORDER — METHYLPREDNISOLONE ACETATE 40 MG/ML INJ SUSP (RADIOLOG
120.0000 mg | Freq: Once | INTRAMUSCULAR | Status: AC
  Administered 2016-09-01: 120 mg via EPIDURAL

## 2016-09-01 MED ORDER — IOPAMIDOL (ISOVUE-M 200) INJECTION 41%
1.0000 mL | Freq: Once | INTRAMUSCULAR | Status: AC
Start: 2016-09-01 — End: 2016-09-01
  Administered 2016-09-01: 1 mL via EPIDURAL

## 2016-09-08 ENCOUNTER — Other Ambulatory Visit: Payer: Self-pay | Admitting: Neurosurgery

## 2016-09-08 DIAGNOSIS — M5126 Other intervertebral disc displacement, lumbar region: Secondary | ICD-10-CM

## 2016-09-17 ENCOUNTER — Ambulatory Visit
Admission: RE | Admit: 2016-09-17 | Discharge: 2016-09-17 | Disposition: A | Discharge status: Home / Self Care | Payer: Worker's Compensation | Source: Ambulatory Visit | Attending: Neurosurgery | Admitting: Neurosurgery

## 2016-09-17 DIAGNOSIS — M5126 Other intervertebral disc displacement, lumbar region: Secondary | ICD-10-CM

## 2016-09-17 MED ORDER — METHYLPREDNISOLONE ACETATE 40 MG/ML INJ SUSP (RADIOLOG
120.0000 mg | Freq: Once | INTRAMUSCULAR | Status: AC
  Administered 2016-09-17: 120 mg via EPIDURAL

## 2016-09-17 MED ORDER — IOPAMIDOL (ISOVUE-M 200) INJECTION 41%
1.0000 mL | Freq: Once | INTRAMUSCULAR | Status: AC
  Administered 2016-09-17: 1 mL via EPIDURAL

## 2016-09-17 NOTE — Discharge Instructions (Signed)

## 2017-01-06 ENCOUNTER — Encounter (HOSPITAL_COMMUNITY): Payer: Self-pay | Admitting: Emergency Medicine

## 2017-01-06 ENCOUNTER — Observation Stay (HOSPITAL_COMMUNITY)
Admission: EM | Admit: 2017-01-06 | Discharge: 2017-01-07 | Disposition: A | Discharge status: Home / Self Care | Payer: 59 | Attending: Cardiology | Admitting: Cardiology

## 2017-01-06 ENCOUNTER — Emergency Department (HOSPITAL_COMMUNITY): Payer: 59

## 2017-01-06 DIAGNOSIS — G4733 Obstructive sleep apnea (adult) (pediatric): Secondary | ICD-10-CM | POA: Insufficient documentation

## 2017-01-06 DIAGNOSIS — Z87442 Personal history of urinary calculi: Secondary | ICD-10-CM | POA: Diagnosis not present

## 2017-01-06 DIAGNOSIS — F419 Anxiety disorder, unspecified: Secondary | ICD-10-CM | POA: Diagnosis not present

## 2017-01-06 DIAGNOSIS — K219 Gastro-esophageal reflux disease without esophagitis: Secondary | ICD-10-CM | POA: Diagnosis not present

## 2017-01-06 DIAGNOSIS — M5116 Intervertebral disc disorders with radiculopathy, lumbar region: Secondary | ICD-10-CM | POA: Diagnosis not present

## 2017-01-06 DIAGNOSIS — R911 Solitary pulmonary nodule: Secondary | ICD-10-CM | POA: Insufficient documentation

## 2017-01-06 DIAGNOSIS — R0602 Shortness of breath: Secondary | ICD-10-CM | POA: Diagnosis not present

## 2017-01-06 DIAGNOSIS — Z9114 Patient's other noncompliance with medication regimen: Secondary | ICD-10-CM | POA: Insufficient documentation

## 2017-01-06 DIAGNOSIS — F17290 Nicotine dependence, other tobacco product, uncomplicated: Secondary | ICD-10-CM | POA: Diagnosis not present

## 2017-01-06 DIAGNOSIS — Z955 Presence of coronary angioplasty implant and graft: Secondary | ICD-10-CM | POA: Insufficient documentation

## 2017-01-06 DIAGNOSIS — J45909 Unspecified asthma, uncomplicated: Secondary | ICD-10-CM | POA: Insufficient documentation

## 2017-01-06 DIAGNOSIS — R0789 Other chest pain: Secondary | ICD-10-CM | POA: Diagnosis not present

## 2017-01-06 DIAGNOSIS — N200 Calculus of kidney: Principal | ICD-10-CM | POA: Insufficient documentation

## 2017-01-06 DIAGNOSIS — R001 Bradycardia, unspecified: Secondary | ICD-10-CM | POA: Diagnosis not present

## 2017-01-06 DIAGNOSIS — I10 Essential (primary) hypertension: Secondary | ICD-10-CM | POA: Diagnosis not present

## 2017-01-06 DIAGNOSIS — I2510 Atherosclerotic heart disease of native coronary artery without angina pectoris: Secondary | ICD-10-CM | POA: Diagnosis not present

## 2017-01-06 DIAGNOSIS — E785 Hyperlipidemia, unspecified: Secondary | ICD-10-CM | POA: Insufficient documentation

## 2017-01-06 DIAGNOSIS — Z8249 Family history of ischemic heart disease and other diseases of the circulatory system: Secondary | ICD-10-CM | POA: Insufficient documentation

## 2017-01-06 DIAGNOSIS — I252 Old myocardial infarction: Secondary | ICD-10-CM | POA: Diagnosis not present

## 2017-01-06 DIAGNOSIS — I700 Atherosclerosis of aorta: Secondary | ICD-10-CM | POA: Insufficient documentation

## 2017-01-06 DIAGNOSIS — F17210 Nicotine dependence, cigarettes, uncomplicated: Secondary | ICD-10-CM | POA: Diagnosis not present

## 2017-01-06 LAB — BASIC METABOLIC PANEL
ANION GAP: 7 (ref 5–15)
BUN: 11 mg/dL (ref 6–20)
CALCIUM: 9.1 mg/dL (ref 8.9–10.3)
CO2: 24 mmol/L (ref 22–32)
Chloride: 109 mmol/L (ref 101–111)
Creatinine, Ser: 1.13 mg/dL (ref 0.61–1.24)
GFR calc Af Amer: >60 mL/min (ref >60)
GFR calc non Af Amer: >60 mL/min (ref >60)
GLUCOSE: 95 mg/dL (ref 65–99)
Potassium: 4 mmol/L (ref 3.5–5.1)
Sodium: 140 mmol/L (ref 135–145)

## 2017-01-06 LAB — CBC
HEMATOCRIT: 48.1 % (ref 39.0–52.0)
HEMOGLOBIN: 15.9 g/dL (ref 13.0–17.0)
MCH: 27.5 pg (ref 26.0–34.0)
MCHC: 33.1 g/dL (ref 30.0–36.0)
MCV: 83.1 fL (ref 78.0–100.0)
Platelets: 194 10*3/uL (ref 150–400)
RBC: 5.79 MIL/uL (ref 4.22–5.81)
RDW: 15.6 % — ABNORMAL HIGH (ref 11.5–15.5)
WBC: 8.7 10*3/uL (ref 4.0–10.5)

## 2017-01-06 LAB — I-STAT TROPONIN, ED: Troponin i, poc: 0 ng/mL (ref 0.00–0.08)

## 2017-01-06 MED ORDER — NITROGLYCERIN 0.4 MG SL SUBL
0.4000 mg | SUBLINGUAL_TABLET | SUBLINGUAL | Status: AC | PRN
  Administered 2017-01-07 (×3): 0.4 mg via SUBLINGUAL
  Filled 2017-01-06: qty 1

## 2017-01-06 MED ORDER — ASPIRIN 81 MG PO CHEW
324.0000 mg | CHEWABLE_TABLET | Freq: Once | ORAL | Status: AC
  Administered 2017-01-07: 324 mg via ORAL
  Filled 2017-01-06: qty 4

## 2017-01-06 NOTE — ED Triage Notes (Signed)
Pt presents with squeezing chest pain that began 2 days ago. Pt endorses diaphoresis, nausea, and radiation to the back. Hx MI, with one stent placement.

## 2017-01-06 NOTE — ED Provider Notes (Signed)
[place] DEPT Provider Note   CSN: [id number] Arrival date & time: 01/06/17  2220     History   Chief Complaint Chief Complaint  Patient presents with  . Chest Pain    HPI George Dickson is a 46 y.o. male.  HPI Patient has left-sided chest pain radiating to his thoracic back. Pain is present the last 2-3 days. Worsened acutely tonight while at work. Experience diaphoresis and nausea. Currently ranks pain at 10/10. Similar to pain he had when he had a MI in 2003 requiring stent placement. Patient denies taking any medications currently. Has not followed up with cardiology. Past Medical History:  Diagnosis Date  . Anxiety   . Coronary artery disease JUNE 2013   inferior ST-segment  elevation  MI- DES stent -proximaL RCA  . Dyslipidemia   . GERD (gastroesophageal reflux disease)    occasionally takes TUMS  . Headache(784.0)   . Hypertension   . Kidney stones   . Old myocardial infarction   . OSA (obstructive sleep apnea) 12/06/2011   initial sleep study  completed, CPAP /BiPAP TITRATION DONE 12/20/11  . Pneumonia   . Stented coronary artery   . Type II diabetes mellitus ([place])    "dx'd today" (08/13/2013)    Patient Active Problem List   Diagnosis Date Noted  . Unstable angina ([place]) 01/07/2017  . Atypical chest pain 01/07/2017  . Nephrolithiasis 01/07/2017  . HNP (herniated nucleus pulposus), lumbar 08/17/2013  . 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 DM (diabetes mellitus) ([place]) 08/15/2013  . Hyperglycemia 08/13/2013  . Hyperglycemic hyperosmolar nonketotic state 08/13/2013  . HTN (hypertension) 08/13/2013  . Old myocardial infarction   . Presence of drug coated stent in right coronary artery - Promus 3.0 mm x 24 mm - post dilated to 3.3 mm  10/24/2011    Class: Acute  . Tobacco use 10/23/2011  . STEMI (ST elevation myocardial infarction) ([place]) 10/23/2011  . Obesity, Class II, BMI 35-39.9 10/23/2011  . Coronary artery disease 10/23/2011     Past Surgical History:  Procedure Laterality Date  . CARDIAC CATHETERIZATION  JUNE 1,2013   thrombectomy of 100% thrombotic  occlusion of  proximal RCA  . CORONARY ANGIOPLASTY  10/23/2011   PCI - proximal  RCA with  Promus ElEMENT  des 3.0 mm x 1mm ; final diameter 3.47mm distal, 3.35 mm proximal   . DOPPLER ECHOCARDIOGRAPHY  10/25/2011   EF 47-42%;VZ diastolic function normal ,systolic function normal  . LEFT HEART CATHETERIZATION WITH CORONARY ANGIOGRAM N/A 10/23/2011   Procedure: LEFT HEART CATHETERIZATION WITH CORONARY ANGIOGRAM;  Surgeon: [name], MD;  Location: [facility] CATH LAB;  Service: Cardiovascular;  Laterality: N/A;  . LUMBAR LAMINECTOMY/DECOMPRESSION MICRODISCECTOMY Right 08/17/2013   Procedure: LUMBAR FOUR TO FIVE LUMBAR LAMINECTOMY/DECOMPRESSION MICRODISCECTOMY 1 LEVEL;  Surgeon: Charlie Pitter, MD;  Location: [place] NEURO ORS;  Service: Neurosurgery;  Laterality: Right;  . PERCUTANEOUS CORONARY STENT INTERVENTION (PCI-S)  10/23/2011   Procedure: PERCUTANEOUS CORONARY STENT INTERVENTION (PCI-S);  Surgeon: [name], MD;  Location: [facility] CATH LAB;  Service: Cardiovascular;;  . SHOULDER ARTHROSCOPY Right 1992       Home Medications    Prior to Admission medications   Medication Sig Start Date End Date Taking? Authorizing Provider  traMADol (ULTRAM) 50 MG tablet Take 1 tablet (50 mg total) by mouth every 6 (six) hours as needed for severe pain. 01/07/17   Delos Haring, PA-C  traMADol (ULTRAM) 50 MG tablet Take 1 tablet (50 mg total) by  mouth every 6 (six) hours as needed. 01/07/17 01/07/18  Delos Haring, PA-C    Family History Family History  Problem Relation Age of Onset  . Coronary artery disease Mother   . Hypertension Mother   . Diabetes type II Mother     Social History Social History  Substance Use Topics  . Smoking status: Current Every Day Smoker    Packs/day: 0.25    Years: 4.00    Types: Cigars, Cigarettes  . Smokeless tobacco: Never Used     Comment:  trying to quit - has not had any cigarettes or cigars for 3 weeks  . Alcohol use Yes     Allergies   Patient has no known allergies.   Review of Systems Review of Systems  Constitutional: Positive for diaphoresis. Negative for chills and fever.  Respiratory: Negative for cough, chest tightness and shortness of breath.   Cardiovascular: Positive for chest pain. Negative for palpitations and leg swelling.  Gastrointestinal: Negative for abdominal pain, diarrhea, nausea and vomiting.  Genitourinary: Negative for dysuria, flank pain and frequency.  Musculoskeletal: Positive for back pain. Negative for myalgias, neck pain and neck stiffness.  Skin: Negative for rash and wound.  Neurological: Negative for dizziness, weakness, light-headedness, numbness and headaches.  All other systems reviewed and are negative.    Physical Exam Updated Vital Signs BP 122/70   Pulse (!) 46   Temp 97.8 F (36.6 C) (Oral)   Resp 15   Ht 6\' 2"  (1.88 m)   Wt 136.1 kg (300 lb)   SpO2 98%   BMI 38.52 kg/m   Physical Exam  Constitutional: He is oriented to person, place, and time. He appears well-developed and well-nourished. No distress.  HENT:  Head: Normocephalic and atraumatic.  Mouth/Throat: Oropharynx is clear and moist. No oropharyngeal exudate.  Eyes: Pupils are equal, round, and reactive to light. EOM are normal.  Neck: Normal range of motion. Neck supple. No JVD present.  Cardiovascular: Normal rate and regular rhythm.  Exam reveals no gallop and no friction rub.   No murmur heard. Pulmonary/Chest: Effort normal and breath sounds normal. No respiratory distress. He has no wheezes. He has no rales. He exhibits tenderness (appears to have left lower chest tenderness to palpation extending to the left thoracic back but not crossing midline. No crepitance or deformity.).  Abdominal: Soft. Bowel sounds are normal. There is no tenderness. There is no rebound and no guarding.  Musculoskeletal:  Normal range of motion. He exhibits no edema or tenderness.  Patient complains of diffuse midline thoracic and lumbar tenderness without focality, step-offs or deformities. No lower extremity swelling or asymmetry. Distal pulses are 2+.  Lymphadenopathy:    He has no cervical adenopathy.  Neurological: He is alert and oriented to person, place, and time.  5/5 motor in all extremities. Sensation fully intact.  Skin: Skin is warm and dry. Capillary refill takes less than 2 seconds. No rash noted. No erythema.  Psychiatric: He has a normal mood and affect. His behavior is normal.  Nursing note and vitals reviewed.    ED Treatments / Results  Labs (all labs ordered are listed, but only abnormal results are displayed) Labs Reviewed  CBC - Abnormal; Notable for the following:       Result Value   RDW 15.6 (*)    All other components within normal limits  BASIC METABOLIC PANEL  HIV ANTIBODY (ROUTINE TESTING)  I-STAT TROPONIN, ED  I-STAT TROPONIN, ED    EKG  EKG  Interpretation  Date/Time:  Thursday January 06 2017 22:26:04 EDT Ventricular Rate:  58 PR Interval:  174 QRS Duration: 106 QT Interval:  378 QTC Calculation: 371 R Axis:   17 Text Interpretation:  Sinus bradycardia Incomplete right bundle branch block Borderline ECG Confirmed by Lita Mains  MD, Aleks Nawrot ([id number]) on 01/06/2017 11:33:51 PM Also confirmed by Lita Mains  MD, Kareena Arrambide ([id number]), editor Hattie Perch ([id number])  on 01/07/2017 7:49:37 AM       Radiology Dg Chest 2 View  Result Date: 01/06/2017 CLINICAL DATA:  Acute onset of generalized chest pain and shortness of breath. Initial encounter. EXAM: CHEST  2 VIEW COMPARISON:  Chest radiograph performed 10/07/2015 FINDINGS: The lungs are well-aerated and clear. There is no evidence of focal opacification, pleural effusion or pneumothorax. The heart is normal in size; the mediastinal contour is within normal limits. No acute osseous abnormalities are seen. IMPRESSION: No acute  cardiopulmonary process seen. Electronically Signed   By: Garald Balding M.D.   On: 01/06/2017 22:48   Ct Angio Chest/abd/pel For Dissection W And/or Wo Contrast  Result Date: 01/07/2017 CLINICAL DATA:  LEFT chest pain radiating to back. Nausea and vomiting. History of hypertension, myocardial infarction, diabetes. EXAM: CT ANGIOGRAPHY CHEST, ABDOMEN AND PELVIS TECHNIQUE: Multidetector CT imaging through the chest, abdomen and pelvis was performed using the standard protocol during bolus administration of intravenous contrast. Multiplanar reconstructed images and MIPs were obtained and reviewed to evaluate the vascular anatomy. CONTRAST:  100 cc Isovue 370 COMPARISON:  Chest radiograph January 06, 2017 FINDINGS: CTA CHEST FINDINGS CARDIOVASCULAR: Thoracic aorta is normal course and caliber. No intrinsic density on noncontrast CT. Homogeneous contrast opacification of thoracic aorta without dissection, aneurysm, luminal irregularity, periaortic fluid collections, or contrast extravasation. Heart size is normal. Coronary artery stent. No pericardial effusion. No pulmonary embolism to level is segmental branches. MEDIASTINUM/NODES: No mediastinal mass or lymphadenopathy by CT size criteria. Subcentimeter hilar and mediastinal lymph nodes are likely reactive. LUNGS/PLEURA: Tracheobronchial tree is patent, no pneumothorax. Mild bronchial wall thickening. 3 mm sub solid pulmonary nodule along RIGHT minor fissure, no indicated follow-up. Dependent atelectasis. No pleural effusion or focal consolidation. MUSCULOSKELETAL: Non-suspicious. Review of the MIP images confirms the above findings. CTA ABDOMEN AND PELVIS FINDINGS VASCULAR Aorta: Abdominal aorta is normal course and caliber. Trace calcific atherosclerosis. Homogeneous contrast opacification of aortoiliac vessels without dissection, aneurysm, luminal irregularity, periaortic fluid collections, or contrast extravasation. Celiac: Patent. SMA: Patent. Renals: Patent.  IMA: Patent. Inflow: Negative. Veins: Negative, not tailored for evaluation. Review of the MIP images confirms the above findings. NON-VASCULAR HEPATOBILIARY: Liver and gallbladder are normal. PANCREAS: Normal. SPLEEN: Normal. ADRENALS/URINARY TRACT: Kidneys are orthotopic, demonstrating symmetric enhancement. Two 6 mm LEFT lower pole nephrolithiasis. No hydronephrosis or solid renal masses. The unopacified ureters are normal in course and caliber. Urinary bladder is partially distended and unremarkable. Normal adrenal glands. STOMACH/BOWEL: The stomach, small and large bowel are normal in course and caliber without inflammatory changes, sensitivity decreased without oral contrast. Mild sigmoid diverticulosis. Normal appendix. VASCULAR/LYMPHATIC: No lymphadenopathy by CT size criteria. REPRODUCTIVE: Normal. OTHER: No intraperitoneal [place] fluid or [place] air. MUSCULOSKELETAL: Nonacute. Mild degenerative change of the hips. Status post LEFT L4-5 hemilaminectomy with moderate broad-based disc osteophyte complex. 8 mm calcified synovial cyst anterior to the LEFT L5 superior articular facet. Moderate to severe facet arthropathy. Review of the MIP images confirms the above findings. IMPRESSION: CTA CHEST: 1.  No acute vascular process. 2. Mild bronchial wall thickening seen with reactive airway disease and bronchitis without pneumonia. CTA  ABDOMEN AND PELVIS: 1. No acute vascular process or or acute intra-abdominal/pelvic disease. 2. LEFT nephrolithiasis without hydronephrosis. Aortic Atherosclerosis (ICD10-I70.0). Electronically Signed   By: Elon Alas M.D.   On: 01/07/2017 02:46    Procedures Procedures (including critical care time)  Medications Ordered in ED Medications  aspirin chewable tablet 324 mg (324 mg Oral Given 01/07/17 0023)  nitroGLYCERIN (NITROSTAT) SL tablet 0.4 mg (0.4 mg Sublingual Given 01/07/17 0615)  iopamidol (ISOVUE-370) 76 % injection (100 mLs  Contrast Given 01/07/17 0157)      Initial Impression / Assessment and Plan / ED Course  I have reviewed the triage vital signs and the nursing notes.  Pertinent labs & imaging results that were available during my care of the patient were reviewed by me and considered in my medical decision making (see chart for details).    Patient with persistent pain despite aspirin and nitroglycerin. CT angio chest without evidence of dissection. Troponin 2 is negative. Cardiology will see in the emergency department and dsiposition.   Final Clinical Impressions(s) / ED Diagnoses   Final diagnoses:  Atypical chest pain    New Prescriptions Discharge Medication List as of 01/07/2017  1:00 PM    START taking these medications   Details  traMADol (ULTRAM) 50 MG tablet Take 1 tablet (50 mg total) by mouth every 6 (six) hours as needed for severe pain., Starting Fri 01/07/2017, Normal         Julianne Rice, MD 01/07/17 [phone]

## 2017-01-07 ENCOUNTER — Other Ambulatory Visit (HOSPITAL_COMMUNITY): Payer: Self-pay

## 2017-01-07 ENCOUNTER — Emergency Department (HOSPITAL_COMMUNITY): Payer: 59

## 2017-01-07 DIAGNOSIS — I200 Unstable angina: Secondary | ICD-10-CM | POA: Insufficient documentation

## 2017-01-07 DIAGNOSIS — R079 Chest pain, unspecified: Secondary | ICD-10-CM | POA: Diagnosis not present

## 2017-01-07 DIAGNOSIS — N200 Calculus of kidney: Principal | ICD-10-CM

## 2017-01-07 DIAGNOSIS — R0789 Other chest pain: Secondary | ICD-10-CM | POA: Diagnosis present

## 2017-01-07 DIAGNOSIS — I10 Essential (primary) hypertension: Secondary | ICD-10-CM | POA: Diagnosis not present

## 2017-01-07 LAB — I-STAT TROPONIN, ED: TROPONIN I, POC: 0 ng/mL (ref 0.00–0.08)

## 2017-01-07 LAB — HIV ANTIBODY (ROUTINE TESTING W REFLEX): HIV Screen 4th Generation wRfx: NONREACTIVE

## 2017-01-07 MED ORDER — ATORVASTATIN CALCIUM 80 MG PO TABS
80.0000 mg | ORAL_TABLET | Freq: Every day | ORAL | Status: DC
  Filled 2017-01-07: qty 1

## 2017-01-07 MED ORDER — LISINOPRIL 10 MG PO TABS: 10.0000 mg | ORAL_TABLET | Freq: Every day | ORAL | Status: DC

## 2017-01-07 MED ORDER — IOPAMIDOL (ISOVUE-370) INJECTION 76%
INTRAVENOUS | Status: AC
  Administered 2017-01-07: 100 mL
  Filled 2017-01-07: qty 100

## 2017-01-07 MED ORDER — TRAMADOL HCL 50 MG PO TABS
50.0000 mg | ORAL_TABLET | Freq: Two times a day (BID) | ORAL | Status: DC | PRN
  Filled 2017-01-07: qty 1

## 2017-01-07 MED ORDER — TRAMADOL HCL 50 MG PO TABS: 50.0000 mg | ORAL_TABLET | Freq: Four times a day (QID) | ORAL | 0 refills | Status: DC | PRN

## 2017-01-07 MED ORDER — ONDANSETRON HCL 4 MG/2ML IJ SOLN: 4.0000 mg | Freq: Four times a day (QID) | INTRAMUSCULAR | Status: DC | PRN

## 2017-01-07 MED ORDER — ASPIRIN EC 81 MG PO TBEC: 81.0000 mg | DELAYED_RELEASE_TABLET | Freq: Every day | ORAL | Status: DC

## 2017-01-07 MED ORDER — ACETAMINOPHEN 325 MG PO TABS: 650.0000 mg | ORAL_TABLET | ORAL | Status: DC | PRN

## 2017-01-07 NOTE — ED Notes (Signed)
Continuing to c/o 10/10 pain, have not heard from cardiology yet. Repaged at this time.

## 2017-01-07 NOTE — Discharge Summary (Signed)
Discharge Summary    Patient ID: JOHNATHAN HESKETT,  MRN: [id number], DOB/AGE: May 31, 1970 46 y.o.  Admit date: 01/06/2017 Discharge date: 01/07/2017  Primary Care Provider: Lin Landsman Primary Cardiologist: Dr. Ellyn Hack   Discharge Diagnoses    Principal Problem:   Nephrolithiasis Active Problems:   Presence of drug coated stent in right coronary artery - Promus 3.0 mm x 24 mm - post dilated to 3.3 mm    Atypical chest pain   Allergies No Known Allergies   History of Present Illness     Mr. Heimann is a 46 year old male with a past medical history of CAD s/p RCA STEMI and HTN who presented to the hospital last night with chest pain. He had been having chest pains for the past few days that were the sensation of stabbing and pressure. It radiated towards his back. He also had associated diaphoresis. Nothing made the pain better or worse. He had not had any SOB, LE edema, palpitations, syncope. He is on no home medications. He had a CT angio of the chest done which showed no acute abnormalities but positive nephrolithiasis without hydronephrosis. Cardiology Fellow asked to admit patient for further evaluation and work-up of his chest pain.  Hospital Course     Consultants: None  This admission he was unresponsive to nitroglycerin. He had negative troponin's and no acute EKG changes. On re-evaluation by Dr. Debara Pickett this morning he has developed objective CVA tenderness on the left. He had an MRI done recently that indicated bilateral nephrolithiasis and overnight he was noted to have left nephrolithiasis without hydronephrosis on CT scan. This is believed to be the cause of his pain. The recommendation per Dr. Debara Pickett is for Tramadol pain medication prescription at discharge and he requested the ER provide Mr. Duesing a urine strainer to strain. He has been referred back to his PCP to arrange follow-up within the next week. I called to schedule follow-up but the office is currently closed. His  PCP may want to consider a Urology referral. He has been arranged a follow-up appointment with Dr. Ellyn Hack in 4 weeks as he has history of a coronary stent but is not currently on any medications at home (BB, ASA or statin). This will need to be assessed at his follow-up appointment.  The patient has had an uncomplicated hospital course and is recovering well. He has been seen by Dr. Debara Pickett today and deemed ready for discharge home. Follow-up appointments have been scheduled. Smoking cessation was disscussed in length. A work excuse note was provided as well. Discharge medications are listed below.  _____________  Discharge Vitals Blood pressure 132/67, pulse (!) 49, temperature 97.8 F (36.6 C), temperature source Oral, resp. rate 16, height 6\' 2"  (1.88 m), weight 300 lb (136.1 kg), SpO2 100 %.  Filed Weights   01/06/17 2226  Weight: 300 lb (136.1 kg)    Labs & Radiologic Studies     CBC  Recent Labs  01/06/17 2233  WBC 8.7  HGB 15.9  HCT 48.1  MCV 83.1  PLT 696   Basic Metabolic Panel  Recent Labs  01/06/17 2233  NA 140  K 4.0  CL 109  CO2 24  GLUCOSE 95  BUN 11  CREATININE 1.13  CALCIUM 9.1    Dg Chest 2 View  Result Date: 01/06/2017 CLINICAL DATA:  Acute onset of generalized chest pain and shortness of breath. Initial encounter. EXAM: CHEST  2 VIEW COMPARISON:  Chest radiograph performed 10/07/2015 FINDINGS:  The lungs are well-aerated and clear. There is no evidence of focal opacification, pleural effusion or pneumothorax. The heart is normal in size; the mediastinal contour is within normal limits. No acute osseous abnormalities are seen. IMPRESSION: No acute cardiopulmonary process seen. Electronically Signed   By: Garald Balding M.D.   On: 01/06/2017 22:48   Ct Angio Chest/abd/pel For Dissection W And/or Wo Contrast  Result Date: 01/07/2017 CLINICAL DATA:  LEFT chest pain radiating to back. Nausea and vomiting. History of hypertension, myocardial infarction,  diabetes. EXAM: CT ANGIOGRAPHY CHEST, ABDOMEN AND PELVIS TECHNIQUE: Multidetector CT imaging through the chest, abdomen and pelvis was performed using the standard protocol during bolus administration of intravenous contrast. Multiplanar reconstructed images and MIPs were obtained and reviewed to evaluate the vascular anatomy. CONTRAST:  100 cc Isovue 370 COMPARISON:  Chest radiograph January 06, 2017 FINDINGS: CTA CHEST FINDINGS CARDIOVASCULAR: Thoracic aorta is normal course and caliber. No intrinsic density on noncontrast CT. Homogeneous contrast opacification of thoracic aorta without dissection, aneurysm, luminal irregularity, periaortic fluid collections, or contrast extravasation. Heart size is normal. Coronary artery stent. No pericardial effusion. No pulmonary embolism to level is segmental branches. MEDIASTINUM/NODES: No mediastinal mass or lymphadenopathy by CT size criteria. Subcentimeter hilar and mediastinal lymph nodes are likely reactive. LUNGS/PLEURA: Tracheobronchial tree is patent, no pneumothorax. Mild bronchial wall thickening. 3 mm sub solid pulmonary nodule along RIGHT minor fissure, no indicated follow-up. Dependent atelectasis. No pleural effusion or focal consolidation. MUSCULOSKELETAL: Non-suspicious. Review of the MIP images confirms the above findings. CTA ABDOMEN AND PELVIS FINDINGS VASCULAR Aorta: Abdominal aorta is normal course and caliber. Trace calcific atherosclerosis. Homogeneous contrast opacification of aortoiliac vessels without dissection, aneurysm, luminal irregularity, periaortic fluid collections, or contrast extravasation. Celiac: Patent. SMA: Patent. Renals: Patent. IMA: Patent. Inflow: Negative. Veins: Negative, not tailored for evaluation. Review of the MIP images confirms the above findings. NON-VASCULAR HEPATOBILIARY: Liver and gallbladder are normal. PANCREAS: Normal. SPLEEN: Normal. ADRENALS/URINARY TRACT: Kidneys are orthotopic, demonstrating symmetric enhancement.  Two 6 mm LEFT lower pole nephrolithiasis. No hydronephrosis or solid renal masses. The unopacified ureters are normal in course and caliber. Urinary bladder is partially distended and unremarkable. Normal adrenal glands. STOMACH/BOWEL: The stomach, small and large bowel are normal in course and caliber without inflammatory changes, sensitivity decreased without oral contrast. Mild sigmoid diverticulosis. Normal appendix. VASCULAR/LYMPHATIC: No lymphadenopathy by CT size criteria. REPRODUCTIVE: Normal. OTHER: No intraperitoneal [place] fluid or [place] air. MUSCULOSKELETAL: Nonacute. Mild degenerative change of the hips. Status post LEFT L4-5 hemilaminectomy with moderate broad-based disc osteophyte complex. 8 mm calcified synovial cyst anterior to the LEFT L5 superior articular facet. Moderate to severe facet arthropathy. Review of the MIP images confirms the above findings. IMPRESSION: CTA CHEST: 1.  No acute vascular process. 2. Mild bronchial wall thickening seen with reactive airway disease and bronchitis without pneumonia. CTA ABDOMEN AND PELVIS: 1. No acute vascular process or or acute intra-abdominal/pelvic disease. 2. LEFT nephrolithiasis without hydronephrosis. Aortic Atherosclerosis (ICD10-I70.0). Electronically Signed   By: Elon Alas M.D.   On: 01/07/2017 02:46     Diagnostic Studies/Procedures    CLINICAL DATA:  LEFT chest pain radiating to back. Nausea and vomiting. History of hypertension, myocardial infarction, diabetes.  EXAM: CT ANGIOGRAPHY CHEST, ABDOMEN AND PELVIS  TECHNIQUE: Multidetector CT imaging through the chest, abdomen and pelvis was performed using the standard protocol during bolus administration of intravenous contrast. Multiplanar reconstructed images and MIPs were obtained and reviewed to evaluate the vascular anatomy.  CONTRAST:  100 cc Isovue 370  COMPARISON:  Chest radiograph January 06, 2017  FINDINGS: CTA CHEST FINDINGS  CARDIOVASCULAR: Thoracic  aorta is normal course and caliber. No intrinsic density on noncontrast CT. Homogeneous contrast opacification of thoracic aorta without dissection, aneurysm, luminal irregularity, periaortic fluid collections, or contrast extravasation. Heart size is normal. Coronary artery stent. No pericardial effusion. No pulmonary embolism to level is segmental branches.  MEDIASTINUM/NODES: No mediastinal mass or lymphadenopathy by CT size criteria. Subcentimeter hilar and mediastinal lymph nodes are likely reactive.  LUNGS/PLEURA: Tracheobronchial tree is patent, no pneumothorax. Mild bronchial wall thickening. 3 mm sub solid pulmonary nodule along RIGHT minor fissure, no indicated follow-up. Dependent atelectasis. No pleural effusion or focal consolidation.  MUSCULOSKELETAL: Non-suspicious.  Review of the MIP images confirms the above findings.  CTA ABDOMEN AND PELVIS FINDINGS  VASCULAR  Aorta: Abdominal aorta is normal course and caliber. Trace calcific atherosclerosis. Homogeneous contrast opacification of aortoiliac vessels without dissection, aneurysm, luminal irregularity, periaortic fluid collections, or contrast extravasation.  Celiac: Patent.  SMA: Patent.  Renals: Patent.  IMA: Patent.  Inflow: Negative.  Veins: Negative, not tailored for evaluation.  Review of the MIP images confirms the above findings.  NON-VASCULAR  HEPATOBILIARY: Liver and gallbladder are normal.  PANCREAS: Normal.  SPLEEN: Normal.  ADRENALS/URINARY TRACT: Kidneys are orthotopic, demonstrating symmetric enhancement. Two 6 mm LEFT lower pole nephrolithiasis. No hydronephrosis or solid renal masses. The unopacified ureters are normal in course and caliber. Urinary bladder is partially distended and unremarkable. Normal adrenal glands.  STOMACH/BOWEL: The stomach, small and large bowel are normal in course and caliber without inflammatory changes, sensitivity decreased  without oral contrast. Mild sigmoid diverticulosis. Normal appendix.  VASCULAR/LYMPHATIC: No lymphadenopathy by CT size criteria.  REPRODUCTIVE: Normal.  OTHER: No intraperitoneal [place] fluid or [place] air.  MUSCULOSKELETAL: Nonacute. Mild degenerative change of the hips. Status post LEFT L4-5 hemilaminectomy with moderate broad-based disc osteophyte complex. 8 mm calcified synovial cyst anterior to the LEFT L5 superior articular facet. Moderate to severe facet arthropathy.  Review of the MIP images confirms the above findings.  IMPRESSION: CTA CHEST:  1.  No acute vascular process. 2. Mild bronchial wall thickening seen with reactive airway disease and bronchitis without pneumonia.  CTA ABDOMEN AND PELVIS:  1. No acute vascular process or or acute intra-abdominal/pelvic disease. 2. LEFT nephrolithiasis without hydronephrosis.  Aortic Atherosclerosis (ICD10-I70.0).   Electronically Signed   By: Elon Alas M.D.   On: 01/07/2017 02:46 _____________    Disposition   Pt is being discharged home today in good condition.  Follow-up Plans & Appointments    Follow-up Information    [name], MD Follow up on 02/03/2017.   Specialty:  Cardiology Why:  You appointment is at 8:20am, please arrive 10-15 minutes early. Contact information: [address] [place] [address] [phone]        Lin Landsman, MD. Call.   Specialty:  Family Medicine Why:  Please call the office to schedule a hospital follow-up appointment to be seen in the next week please. Contact information: [address] [place] [address] [phone]            Discharge Medications   Allergies as of 01/07/2017   No Known Allergies     Medication List    TAKE these medications   traMADol 50 MG tablet Commonly known as:  ULTRAM Take 1 tablet (50 mg total) by mouth every 6 (six) hours as needed for severe pain.  Outstanding  Labs/Studies    Duration of Discharge Encounter   Greater than 30 minutes including physician time.  Kristopher Glee PA-C 01/07/2017, 12:25 PM

## 2017-01-07 NOTE — ED Notes (Signed)
Cardiology MD aware of patient's pain, upgraded to SDU as a result. No other new orders for pain management.

## 2017-01-07 NOTE — Discharge Instructions (Signed)
Kidney Stones  Kidney stones (urolithiasis) are solid, rock-like deposits that form inside of the organs that make urine (kidneys). A kidney stone may form in a kidney and move into the bladder, where it can cause intense pain and block the flow of urine. Kidney stones are created when high levels of certain minerals are found in the urine. They are usually passed through urination, but in some cases, medical treatment may be needed to remove them.  What are the causes?  Kidney stones may be caused by:  · A condition in which certain glands produce too much parathyroid hormone (primary hyperparathyroidism), which causes too much calcium buildup in the blood.  · Buildup of uric acid crystals in the bladder (hyperuricosuria). Uric acid is a chemical that the body produces when you eat certain foods. It usually exits the body in the urine.  · Narrowing (stricture) of one or both of the tubes that drain urine from the kidneys to the bladder (ureters).  · A kidney blockage that is present at birth (congenital obstruction).  · Past surgery on the kidney or the ureters, such as gastric bypass surgery.    What increases the risk?  The following factors make you more likely to develop kidney stones:  · Having had a kidney stone in the past.  · Having a family history of kidney stones.  · Not drinking enough water.  · Eating a diet that is high in protein, salt (sodium), or sugar.  · Being overweight or obese.    What are the signs or symptoms?  Symptoms of a kidney stone may include:  · Nausea.  · Vomiting.  · Blood in the urine (hematuria).  · Pain in the side of the abdomen, right below the ribs (flank pain). Pain usually spreads (radiates) to the groin.  · Needing to urinate frequently or urgently.    How is this diagnosed?  This condition may be diagnosed based on:  · Your medical history.  · A physical exam.  · Blood tests.  · Urine tests.  · CT scan.  · Abdominal X-ray.  · A procedure to examine the inside of the  bladder (cystoscopy).    How is this treated?  Treatment for kidney stones depends on the size, location, and makeup of the stones. Treatment may involve:  · Analyzing your urine before and after you pass the stone through urination.  · Being monitored at the hospital until you pass the stone through urination.  · Increasing your fluid intake and decreasing the amount of calcium and protein in your diet.  · A procedure to break up kidney stones in the bladder using:  ? A focused beam of light (laser therapy).  ? Shock waves (extracorporeal shock wave lithotripsy).  · Surgery to remove kidney stones. This may be needed if you have severe pain or have stones that block your urinary tract.    Follow these instructions at home:  Eating and drinking     · Drink enough fluid to keep your urine clear or pale yellow. This will help you to pass the kidney stone.  · If directed, change your diet. This may include:  ? Limiting how much sodium you eat.  ? Eating more fruits and vegetables.  ? Limiting how much meat, poultry, fish, and eggs you eat.  · Follow instructions from your health care provider about eating or drinking restrictions.  General instructions   · Collect urine samples as told by your health care   provider. You may need to collect a urine sample:  ? 24 hours after you pass the stone.  ? 8-12 weeks after passing the kidney stone, and every 6-12 months after that.  · Strain your urine every time you urinate, for as long as directed. Use the strainer that your health care provider recommends.  · Do not throw out the kidney stone after passing it. Keep the stone so it can be tested by your health care provider. Testing the makeup of your kidney stone may help prevent you from getting kidney stones in the future.  · Take over-the-counter and prescription medicines only as told by your health care provider.  · Keep all follow-up visits as told by your health care provider. This is important. You may need follow-up  X-rays or ultrasounds to make sure that your stone has passed.  How is this prevented?  To prevent another kidney stone:  · Drink enough fluid to keep your urine clear or pale yellow. This is the best way to prevent kidney stones.  · Eat a healthy diet and follow recommendations from your health care provider about foods to avoid. You may be instructed to eat a low-protein diet. Recommendations vary depending on the type of kidney stone that you have.  · Maintain a healthy weight.    Contact a health care provider if:  · You have pain that gets worse or does not get better with medicine.  Get help right away if:  · You have a fever or chills.  · You develop severe pain.  · You develop new abdominal pain.  · You faint.  · You are unable to urinate.  This information is not intended to replace advice given to you by your health care provider. Make sure you discuss any questions you have with your health care provider.  Document Released: 05/10/2005 Document Revised: 11/28/2015 Document Reviewed: 10/24/2015  [employer]vier Interactive Patient Education © 2017 [employer]

## 2017-01-07 NOTE — ED Notes (Signed)
Pt still c/o 10/10 CP, page sent to cardiology

## 2017-01-07 NOTE — ED Notes (Signed)
Pt is wondering how much longer it'll be since his ride is here. States the cardiologist went into room and told him he can leave.

## 2017-01-07 NOTE — ED Notes (Signed)
MD notified of continued pain.

## 2017-01-07 NOTE — Progress Notes (Signed)
DAILY PROGRESS NOTE   Patient Name: George Dickson Date of Encounter: 01/07/2017  Hospital Problem List   Principal Problem:   Nephrolithiasis Active Problems:   Presence of drug coated stent in right coronary artery - Promus 3.0 mm x 24 mm - post dilated to 3.3 mm    Atypical chest pain    Chief Complaint   Left flank pain  Subjective   George Dickson was admitted early this morning by the cardiology fellow for chest pain, but it has not been responsive to Nitroglycerin. Today he has objective CVA tenderness on the left. Workup for recent lumbar disc herniation with MRI indicated bilateral nephrolithiasis and overnight he was noted to have left nephrolithiasis without hydronephrosis. This is likely his primary symptom.  Objective   Vitals:   01/07/17 0800 01/07/17 0830 01/07/17 0900 01/07/17 0930  BP: 127/72 (!) 148/78 136/63 126/73  Pulse: (!) 45 (!) 45 (!) 43 (!) 44  Resp: (!) 21 (!) 21 15   Temp:      TempSrc:      SpO2: 96% 98% 97% 97%  Weight:      Height:       No intake or output data in the 24 hours ending 01/07/17 1117 Filed Weights   01/06/17 2226  Weight: 300 lb (136.1 kg)    Physical Exam   General appearance: alert and no distress Neck: no carotid bruit, no JVD and thyroid not enlarged, symmetric, no tenderness/mass/nodules Lungs: clear to auscultation bilaterally Heart: regular rate and rhythm, S1, S2 normal, no murmur, click, rub or gallop Abdomen: Left CVA tenderness on palpation Extremities: extremities normal, atraumatic, no cyanosis or edema Pulses: 2+ and symmetric Skin: Skin color, texture, turgor normal. No rashes or lesions Neurologic: Grossly normal Psych: Pleasant  Inpatient Medications    Scheduled Meds: . [START ON 01/08/2017] aspirin EC  81 mg Oral Daily  . atorvastatin  80 mg Oral q1800  . lisinopril  10 mg Oral Daily    Continuous Infusions:   PRN Meds: acetaminophen, ondansetron (ZOFRAN) IV   Labs   Results for orders  placed or performed during the hospital encounter of 01/06/17 (from the past 48 hour(s))  Basic metabolic panel     Status: None   Collection Time: 01/06/17 10:33 PM  Result Value Ref Range   Sodium 140 135 - 145 mmol/L   Potassium 4.0 3.5 - 5.1 mmol/L   Chloride 109 101 - 111 mmol/L   CO2 24 22 - 32 mmol/L   Glucose, Bld 95 65 - 99 mg/dL   BUN 11 6 - 20 mg/dL   Creatinine, Ser 1.13 0.61 - 1.24 mg/dL   Calcium 9.1 8.9 - 10.3 mg/dL   GFR calc non Af Amer >60 >60 mL/min   GFR calc Af Amer >60 >60 mL/min    Comment: (NOTE) The eGFR has been calculated using the CKD EPI equation. This calculation has not been validated in all clinical situations. eGFR's persistently <60 mL/min signify possible Chronic Kidney Disease.    Anion gap 7 5 - 15  CBC     Status: Abnormal   Collection Time: 01/06/17 10:33 PM  Result Value Ref Range   WBC 8.7 4.0 - 10.5 K/uL   RBC 5.79 4.22 - 5.81 MIL/uL   Hemoglobin 15.9 13.0 - 17.0 g/dL   HCT 48.1 39.0 - 52.0 %   MCV 83.1 78.0 - 100.0 fL   MCH 27.5 26.0 - 34.0 pg   MCHC 33.1 30.0 -  36.0 g/dL   RDW 15.6 (H) 11.5 - 15.5 %   Platelets 194 150 - 400 K/uL  I-stat troponin, ED     Status: None   Collection Time: 01/06/17 10:52 PM  Result Value Ref Range   Troponin i, poc 0.00 0.00 - 0.08 ng/mL   Comment 3            Comment: Due to the release kinetics of cTnI, a negative result within the first hours of the onset of symptoms does not rule out myocardial infarction with certainty. If myocardial infarction is still suspected, repeat the test at appropriate intervals.   I-stat troponin, ED     Status: None   Collection Time: 01/07/17  3:05 AM  Result Value Ref Range   Troponin i, poc 0.00 0.00 - 0.08 ng/mL   Comment 3            Comment: Due to the release kinetics of cTnI, a negative result within the first hours of the onset of symptoms does not rule out myocardial infarction with certainty. If myocardial infarction is still suspected, repeat  the test at appropriate intervals.     ECG   N/A - Personally Reviewed  Telemetry   Sinus bradycardia - Personally Reviewed  Radiology    Dg Chest 2 View  Result Date: 01/06/2017 CLINICAL DATA:  Acute onset of generalized chest pain and shortness of breath. Initial encounter. EXAM: CHEST  2 VIEW COMPARISON:  Chest radiograph performed 10/07/2015 FINDINGS: The lungs are well-aerated and clear. There is no evidence of focal opacification, pleural effusion or pneumothorax. The heart is normal in size; the mediastinal contour is within normal limits. No acute osseous abnormalities are seen. IMPRESSION: No acute cardiopulmonary process seen. Electronically Signed   By: Garald Balding M.D.   On: 01/06/2017 22:48   Ct Angio Chest/abd/pel For Dissection W And/or Wo Contrast  Result Date: 01/07/2017 CLINICAL DATA:  LEFT chest pain radiating to back. Nausea and vomiting. History of hypertension, myocardial infarction, diabetes. EXAM: CT ANGIOGRAPHY CHEST, ABDOMEN AND PELVIS TECHNIQUE: Multidetector CT imaging through the chest, abdomen and pelvis was performed using the standard protocol during bolus administration of intravenous contrast. Multiplanar reconstructed images and MIPs were obtained and reviewed to evaluate the vascular anatomy. CONTRAST:  100 cc Isovue 370 COMPARISON:  Chest radiograph January 06, 2017 FINDINGS: CTA CHEST FINDINGS CARDIOVASCULAR: Thoracic aorta is normal course and caliber. No intrinsic density on noncontrast CT. Homogeneous contrast opacification of thoracic aorta without dissection, aneurysm, luminal irregularity, periaortic fluid collections, or contrast extravasation. Heart size is normal. Coronary artery stent. No pericardial effusion. No pulmonary embolism to level is segmental branches. MEDIASTINUM/NODES: No mediastinal mass or lymphadenopathy by CT size criteria. Subcentimeter hilar and mediastinal lymph nodes are likely reactive. LUNGS/PLEURA: Tracheobronchial tree is  patent, no pneumothorax. Mild bronchial wall thickening. 3 mm sub solid pulmonary nodule along RIGHT minor fissure, no indicated follow-up. Dependent atelectasis. No pleural effusion or focal consolidation. MUSCULOSKELETAL: Non-suspicious. Review of the MIP images confirms the above findings. CTA ABDOMEN AND PELVIS FINDINGS VASCULAR Aorta: Abdominal aorta is normal course and caliber. Trace calcific atherosclerosis. Homogeneous contrast opacification of aortoiliac vessels without dissection, aneurysm, luminal irregularity, periaortic fluid collections, or contrast extravasation. Celiac: Patent. SMA: Patent. Renals: Patent. IMA: Patent. Inflow: Negative. Veins: Negative, not tailored for evaluation. Review of the MIP images confirms the above findings. NON-VASCULAR HEPATOBILIARY: Liver and gallbladder are normal. PANCREAS: Normal. SPLEEN: Normal. ADRENALS/URINARY TRACT: Kidneys are orthotopic, demonstrating symmetric enhancement. Two 6 mm LEFT lower  pole nephrolithiasis. No hydronephrosis or solid renal masses. The unopacified ureters are normal in course and caliber. Urinary bladder is partially distended and unremarkable. Normal adrenal glands. STOMACH/BOWEL: The stomach, small and large bowel are normal in course and caliber without inflammatory changes, sensitivity decreased without oral contrast. Mild sigmoid diverticulosis. Normal appendix. VASCULAR/LYMPHATIC: No lymphadenopathy by CT size criteria. REPRODUCTIVE: Normal. OTHER: No intraperitoneal Lukes fluid or Woodrow air. MUSCULOSKELETAL: Nonacute. Mild degenerative change of the hips. Status post LEFT L4-5 hemilaminectomy with moderate broad-based disc osteophyte complex. 8 mm calcified synovial cyst anterior to the LEFT L5 superior articular facet. Moderate to severe facet arthropathy. Review of the MIP images confirms the above findings. IMPRESSION: CTA CHEST: 1.  No acute vascular process. 2. Mild bronchial wall thickening seen with reactive airway disease  and bronchitis without pneumonia. CTA ABDOMEN AND PELVIS: 1. No acute vascular process or or acute intra-abdominal/pelvic disease. 2. LEFT nephrolithiasis without hydronephrosis. Aortic Atherosclerosis (ICD10-I70.0). Electronically Signed   By: Elon Alas M.D.   On: 01/07/2017 02:46    Cardiac Studies   N/A  Assessment   Principal Problem:   Nephrolithiasis Active Problems:   Presence of drug coated stent in right coronary artery - Promus 3.0 mm x 24 mm - post dilated to 3.3 mm    Atypical chest pain   Plan    1. Mr. Bascomb has reproducible left flank pain and was found to have nephrolithiasis without hydronephrosis on the CT scan. This is the most likely cause of his pain. His troponins are negative overnight without any acute EKG changes. I would recommend pain medication and will provide a strainer for his urine. He is encouraged to follow-up with his primary care provider and may need a referral to urology. Plan discharge home today.  Time Spent Directly with Patient:  I have spent a total of 15 minutes with the patient reviewing hospital notes, telemetry, EKGs, labs and examining the patient as well as establishing an assessment and plan that was discussed personally with the patient. > 50% of time was spent in direct patient care.  Length of Stay:  LOS: 0 days   Pixie Casino, MD, [place]  Attending Cardiologist  Direct Dial: [phone]  Fax: [phone]  Website:  [email or web address] 01/07/2017, 11:17 AM

## 2017-01-07 NOTE — H&P (Signed)
Cardiology History & Physical    Patient ID: George Dickson MRN: [id number], DOB: 03/29/1971 Date of Encounter: 01/07/2017, 4:55 AM Primary Physician: Lin Landsman, MD Primary Cardiologist: Dr. Ellyn Hack  Chief Complaint: CP  HPI: George Dickson is a 46 y.o. male with history of CAD s/p RCA STEMI, HTN, who presents with CP.  Pt reports 2 day history of SSCP that initially woke him from sleep, w.  The pain is characterized as both pressure like and stabbing, with occasional radiation towards the back.  He reports excessive diaphoresis associated with movement.  There are no clear alleviating or exacerbating factors, and the pain has been constant and unremitting since it initially started.  He denies SOB, LE edema, palpitations, or syncope.  Of note, pt is not taking any medications at present, not even a baby aspirin.  Given the persistence of the pain, and gradual increase in severity, pt presented to the ED for further evaluation.  In the ED, he was significantly hypertensive.  Initial ECG was without any acute ischemic changes.  Labs were unremarkable and CBMs were negative x 2. A CTA showed no evidence of any acute aortic pathology.  He is now being admitted to the cardiology service for further management.  Past Medical History:  Diagnosis Date  . Anxiety   . Coronary artery disease JUNE 2013   inferior ST-segment  elevation  MI- DES stent -proximaL RCA  . Dyslipidemia   . GERD (gastroesophageal reflux disease)    occasionally takes TUMS  . Headache(784.0)   . Hypertension   . Kidney stones   . Old myocardial infarction   . OSA (obstructive sleep apnea) 12/06/2011   initial sleep study  completed, CPAP /BiPAP TITRATION DONE 12/20/11  . Pneumonia   . Stented coronary artery   . Type II diabetes mellitus ([place])    "dx'd today" (08/13/2013)     Surgical History:  Past Surgical History:  Procedure Laterality Date  . CARDIAC CATHETERIZATION  JUNE 1,2013   thrombectomy of 100% thrombotic   occlusion of  proximal RCA  . CORONARY ANGIOPLASTY  10/23/2011   PCI - proximal  RCA with  Promus ElEMENT  des 3.0 mm x 43mm ; final diameter 3.31mm distal, 3.35 mm proximal   . DOPPLER ECHOCARDIOGRAPHY  10/25/2011   EF 38-25%;KN diastolic function normal ,systolic function normal  . LEFT HEART CATHETERIZATION WITH CORONARY ANGIOGRAM N/A 10/23/2011   Procedure: LEFT HEART CATHETERIZATION WITH CORONARY ANGIOGRAM;  Surgeon: [name], MD;  Location[facility] CATH LAB;  Service: Cardiovascular;  Laterality: N/A;  . LUMBAR LAMINECTOMY/DECOMPRESSION MICRODISCECTOMY Right 08/17/2013   Procedure: LUMBAR FOUR TO FIVE LUMBAR LAMINECTOMY/DECOMPRESSION MICRODISCECTOMY 1 LEVEL;  Surgeon: Charlie Pitter, MD;  Location: [facility] NEURO ORS;  Service: Neurosurgery;  Laterality: Right;  . PERCUTANEOUS CORONARY STENT INTERVENTION (PCI-S)  10/23/2011   Procedure: PERCUTANEOUS CORONARY STENT INTERVENTION (PCI-S);  Surgeon: [name], MD;  Location: [facility] CATH LAB;  Service: Cardiovascular;;  . SHOULDER ARTHROSCOPY Right 1992     Home Meds: Prior to Admission medications   Not on File    Allergies: No Known Allergies  Social History   Social History  . Marital status: Divorced    Spouse name: N/A  . Number of children: N/A  . Years of education: N/A   Occupational History  . Not on file.   Social History Main Topics  . Smoking status: Current Every Day Smoker    Packs/day: 0.25    Years: 4.00  Types: Cigars, Cigarettes  . Smokeless tobacco: Never Used     Comment: trying to quit - has not had any cigarettes or cigars for 3 weeks  . Alcohol use Yes  . Drug use: No     Comment: in the past  . Sexual activity: Yes   Other Topics Concern  . Not on file   Social History Narrative  . No narrative on file     Family History  Problem Relation Age of Onset  . Coronary artery disease Mother   . Hypertension Mother   . Diabetes type II Mother     Review of Systems: All other systems reviewed and are  otherwise negative except as noted above.  Labs:   Lab Results  Component Value Date   WBC 8.7 01/06/2017   HGB 15.9 01/06/2017   HCT 48.1 01/06/2017   MCV 83.1 01/06/2017   PLT 194 01/06/2017    Recent Labs Lab 01/06/17 2233  NA 140  K 4.0  CL 109  CO2 24  BUN 11  CREATININE 1.13  CALCIUM 9.1  GLUCOSE 95   No results for input(s): CKTOTAL, CKMB, TROPONINI in the last 72 hours. Lab Results  Component Value Date   CHOL 240 (H) 08/14/2013   HDL 20 (L) 08/14/2013   LDLCALC UNABLE TO CALCULATE IF TRIGLYCERIDE OVER 400 mg/dL 08/14/2013   TRIG 441 (H) 08/14/2013   No results found for: DDIMER  Radiology/Studies:  Dg Chest 2 View  Result Date: 01/06/2017 CLINICAL DATA:  Acute onset of generalized chest pain and shortness of breath. Initial encounter. EXAM: CHEST  2 VIEW COMPARISON:  Chest radiograph performed 10/07/2015 FINDINGS: The lungs are well-aerated and clear. There is no evidence of focal opacification, pleural effusion or pneumothorax. The heart is normal in size; the mediastinal contour is within normal limits. No acute osseous abnormalities are seen. IMPRESSION: No acute cardiopulmonary process seen. Electronically Signed   By: Garald Balding M.D.   On: 01/06/2017 22:48   Ct Angio Chest/abd/pel For Dissection W And/or Wo Contrast  Result Date: 01/07/2017 CLINICAL DATA:  LEFT chest pain radiating to back. Nausea and vomiting. History of hypertension, myocardial infarction, diabetes. EXAM: CT ANGIOGRAPHY CHEST, ABDOMEN AND PELVIS TECHNIQUE: Multidetector CT imaging through the chest, abdomen and pelvis was performed using the standard protocol during bolus administration of intravenous contrast. Multiplanar reconstructed images and MIPs were obtained and reviewed to evaluate the vascular anatomy. CONTRAST:  100 cc Isovue 370 COMPARISON:  Chest radiograph January 06, 2017 FINDINGS: CTA CHEST FINDINGS CARDIOVASCULAR: Thoracic aorta is normal course and caliber. No intrinsic  density on noncontrast CT. Homogeneous contrast opacification of thoracic aorta without dissection, aneurysm, luminal irregularity, periaortic fluid collections, or contrast extravasation. Heart size is normal. Coronary artery stent. No pericardial effusion. No pulmonary embolism to level is segmental branches. MEDIASTINUM/NODES: No mediastinal mass or lymphadenopathy by CT size criteria. Subcentimeter hilar and mediastinal lymph nodes are likely reactive. LUNGS/PLEURA: Tracheobronchial tree is patent, no pneumothorax. Mild bronchial wall thickening. 3 mm sub solid pulmonary nodule along RIGHT minor fissure, no indicated follow-up. Dependent atelectasis. No pleural effusion or focal consolidation. MUSCULOSKELETAL: Non-suspicious. Review of the MIP images confirms the above findings. CTA ABDOMEN AND PELVIS FINDINGS VASCULAR Aorta: Abdominal aorta is normal course and caliber. Trace calcific atherosclerosis. Homogeneous contrast opacification of aortoiliac vessels without dissection, aneurysm, luminal irregularity, periaortic fluid collections, or contrast extravasation. Celiac: Patent. SMA: Patent. Renals: Patent. IMA: Patent. Inflow: Negative. Veins: Negative, not tailored for evaluation. Review of the MIP images  confirms the above findings. NON-VASCULAR HEPATOBILIARY: Liver and gallbladder are normal. PANCREAS: Normal. SPLEEN: Normal. ADRENALS/URINARY TRACT: Kidneys are orthotopic, demonstrating symmetric enhancement. Two 6 mm LEFT lower pole nephrolithiasis. No hydronephrosis or solid renal masses. The unopacified ureters are normal in course and caliber. Urinary bladder is partially distended and unremarkable. Normal adrenal glands. STOMACH/BOWEL: The stomach, small and large bowel are normal in course and caliber without inflammatory changes, sensitivity decreased without oral contrast. Mild sigmoid diverticulosis. Normal appendix. VASCULAR/LYMPHATIC: No lymphadenopathy by CT size criteria. REPRODUCTIVE: Normal.  OTHER: No intraperitoneal Ingram fluid or [place]bleyker air. MUSCULOSKELETAL: Nonacute. Mild degenerative change of the hips. Status post LEFT L4-5 hemilaminectomy with moderate broad-based disc osteophyte complex. 8 mm calcified synovial cyst anterior to the LEFT L5 superior articular facet. Moderate to severe facet arthropathy. Review of the MIP images confirms the above findings. IMPRESSION: CTA CHEST: 1.  No acute vascular process. 2. Mild bronchial wall thickening seen with reactive airway disease and bronchitis without pneumonia. CTA ABDOMEN AND PELVIS: 1. No acute vascular process or or acute intra-abdominal/pelvic disease. 2. LEFT nephrolithiasis without hydronephrosis. Aortic Atherosclerosis (ICD10-I70.0). Electronically Signed   By: Elon Alas M.D.   On: 01/07/2017 02:46   Wt Readings from Last 3 Encounters:  01/06/17 136.1 kg (300 lb)  09/01/16 110.2 kg (243 lb)  10/07/15 (!) 139.8 kg (308 lb 5 oz)    EKG: NSR, J point elevation in V2-V3, unchanged from prior  Physical Exam: Blood pressure (!) 159/99, pulse (!) 54, temperature 97.8 F (36.6 C), temperature source Oral, resp. rate 19, height 6\' 2"  (1.88 m), weight 136.1 kg (300 lb), SpO2 97 %. Body mass index is 38.52 kg/m. General: Well developed, well nourished, in no acute distress. Head: Normocephalic, atraumatic, sclera non-icteric, no xanthomas, nares are without discharge.  Neck: Negative for carotid bruits. JVD not elevated. Lungs: Clear bilaterally to auscultation without wheezes, rales, or rhonchi. Breathing is unlabored. Heart: RRR with S1 S2. No murmurs, rubs, or gallops appreciated. Abdomen: Soft, non-tender, non-distended with normoactive bowel sounds. No hepatomegaly. No rebound/guarding. No obvious abdominal masses. Msk:  Strength and tone appear normal for age. Extremities: No clubbing or cyanosis. No edema.  Distal pedal pulses are 2+ and equal bilaterally. Neuro: Alert and oriented X 3. No focal deficit. No facial  asymmetry. Moves all extremities spontaneously. Psych:  Responds to questions appropriately with a normal affect.    Assessment and Plan  46 y.o. male with history of CAD s/p RCA STEMI, HTN, who presents with CP.  1. CP: 2 days of unremitting CP with negative CBMs x 2 is very reassuring from a coronary standpoint.  Pt does have known CAD with reported medication noncompliance.  Plan to control BP, trend a 3rd set of enzymes, and keep NPO for possible invasive evaluation.  Start ASA, statin.  Hold off on beta blockade given relative bradycardia.  Ordered repeat TTE.  Will maintain a low threshold to start heparin if enzymes positive, or if there are dynamic ECG changes.  2.  HTN: Will start lisinopril, with plans to uptitrate as tolerated.  To be seen and staffed by cardiology attending in the AM.  Signed, Doylene Canning MD Cardiology Fellow 01/07/2017, 4:55 AM

## 2017-02-03 ENCOUNTER — Encounter: Payer: Self-pay | Admitting: Cardiology

## 2017-02-03 ENCOUNTER — Ambulatory Visit (INDEPENDENT_AMBULATORY_CARE_PROVIDER_SITE_OTHER): Payer: 59 | Admitting: Cardiology

## 2017-02-03 VITALS — BP 138/90 | HR 74 | Ht 74.0 in | Wt 269.0 lb

## 2017-02-03 DIAGNOSIS — I2510 Atherosclerotic heart disease of native coronary artery without angina pectoris: Secondary | ICD-10-CM | POA: Diagnosis not present

## 2017-02-03 DIAGNOSIS — E1169 Type 2 diabetes mellitus with other specified complication: Secondary | ICD-10-CM | POA: Insufficient documentation

## 2017-02-03 DIAGNOSIS — I10 Essential (primary) hypertension: Secondary | ICD-10-CM | POA: Diagnosis not present

## 2017-02-03 DIAGNOSIS — R739 Hyperglycemia, unspecified: Secondary | ICD-10-CM

## 2017-02-03 DIAGNOSIS — I2111 ST elevation (STEMI) myocardial infarction involving right coronary artery: Secondary | ICD-10-CM | POA: Diagnosis not present

## 2017-02-03 DIAGNOSIS — Z720 Tobacco use: Secondary | ICD-10-CM | POA: Diagnosis not present

## 2017-02-03 DIAGNOSIS — E669 Obesity, unspecified: Secondary | ICD-10-CM

## 2017-02-03 DIAGNOSIS — E785 Hyperlipidemia, unspecified: Secondary | ICD-10-CM | POA: Insufficient documentation

## 2017-02-03 DIAGNOSIS — Z9861 Coronary angioplasty status: Secondary | ICD-10-CM

## 2017-02-03 DIAGNOSIS — Z955 Presence of coronary angioplasty implant and graft: Secondary | ICD-10-CM | POA: Diagnosis not present

## 2017-02-03 MED ORDER — ASPIRIN EC 81 MG PO TBEC: 81.0000 mg | DELAYED_RELEASE_TABLET | Freq: Every day | ORAL | 3 refills | Status: AC

## 2017-02-03 MED ORDER — METOPROLOL SUCCINATE ER 25 MG PO TB24: 25.0000 mg | ORAL_TABLET | Freq: Every day | ORAL | 3 refills | Status: DC

## 2017-02-03 MED ORDER — ATORVASTATIN CALCIUM 40 MG PO TABS: 40.0000 mg | ORAL_TABLET | Freq: Every day | ORAL | 3 refills | Status: DC

## 2017-02-03 NOTE — Patient Instructions (Signed)
MEDICATIONS  START   ATORVASTATIN 40 MG ONE TABLET  AT BEDTIME. START ASPIRIN 81 MG ONE TABLET DAILY  START TOPROL XL 25 MG ONE TABLET DAILY    LABS TODAY  CMP  LIPID  HGBA1C  IN 6 MONTHS WILL MAIL LAB SLIP TO HAVE LIPIDS /CHOLERSTEROL RECHECK-- LIPIDS,CMP  Your physician wants you to follow-up in [place].You will receive a reminder letter in the mail two months in advance. If you don't receive a letter, please call our office to schedule the follow-up appointment.  If you need a refill on your cardiac medications before your next appointment, please call your pharmacy.

## 2017-02-03 NOTE — Progress Notes (Signed)
PCP: Lin Landsman, MD  Clinic Note: Chief Complaint  Patient presents with  . Follow-up    Post hospital visit.  . Coronary Artery Disease  . Hypertension  . Hyperlipidemia    HPI: George Dickson is a 46 y.o. male with a PMH below who presents today for hospitalization follow-up,. George Dickson is a 46 y.o. male with a history of single vessel coronary disease status post PCI to the proxRCA in setting of an inferior STEMI back on October 23, 2011. He had minimal troponin elevation and normal echo post MI. Discharge fast-track  George Dickson was last seen in June 2013 following his STEMI. -> He will only noted some mild "pinprick" symptoms in his chest but nothing like his MI. He was not taking beta blocker or ACE inhibitor that he was discharged on. He was mostly on blocker on board. At that time I did restart his beta blocker, but did not start him on ACE inhibitor because of his low blood pressure. I also scheduled him for a sleep study for symptoms very concerning for OSA. Intention was to see him back in 3 months, but he did not return.  Recent Hospitalizations:  8/16-8/17: Admitted with off-and-on chest pain for couple days of stabbing pressure radiating to the back. Associated with diaphoresis. --> CT angiogram of the chest showed nephrolithiasis without hydronephrosis. His cardiac evaluation is essentially benign, but he clearly had flank discomfort on the left side with MRI evidence of bilateral nephrolithiasis particularly causes pain. He was treated with pain medications.  Apparently on initial evaluation he was not on aspirin and statin or beta blocker.  Studies Personally Reviewed - (if available, images/films reviewed: From Epic Chart or Care Everywhere)  None  Interval History: George Dickson returns after a long hiatus away I only saw him on after his MI.  He is no longer taking any of his medications even though they were prescribed on his recent discharge.He indicates to me that he  did take Effient for a full year, but is no longer takiing it -- he cites the fact that he didn't have any more prescription as the reason for not taking the medications.  However one lasted again about it he indicated that he probably would not take medicines anyway.  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  From a cardiac standpoint he denies having any rapid irregular heartbeats or palpitations. No real central CP or dyspnea with rest or exertion, but has occasionally had L arm pain.   No shortness of breath with rest or exertion. No PND, orthopnea or edema. No palpitations, lightheadedness, dizziness, weakness or syncope/near syncope. No TIA/amaurosis fugax symptoms.  No claudication.  ROS: A comprehensive was performed. Review of Systems  Constitutional: Negative for malaise/fatigue.  HENT: Positive for congestion and sinus pain.   Respiratory: Positive for cough and wheezing. Negative for sputum production.   Genitourinary: Negative for dysuria, flank pain (has has intermittent flank pain - but not like this most recent spell) and hematuria.       Flank pain better  Musculoskeletal: Negative.   Neurological: Negative for focal weakness.  Endo/Heme/Allergies: Positive for environmental allergies.  Psychiatric/Behavioral: Negative.   All other systems reviewed and are negative.  PAD Screen 02/05/2017  Previous PAD dx? No  Previous surgical procedure? No  Pain with walking? No  Feet/toe relief with  dangling? No  Painful, non-healing ulcers? No  Extremities discolored? No    I have reviewed and (if needed) personally updated the patient's problem list, medications, allergies, past medical and surgical history, social and family history.   Past Medical History:  Diagnosis Date  . Anxiety   . CAD S/P percutaneous coronary angioplasty 10/2011   Insetting of inferior  STEMI. Proximal RCA PCI with Promus Element DES - 3.0 mm x 24 mm (3.35-3.2 tapered)  . Dyslipidemia   . GERD (gastroesophageal reflux disease)    occasionally takes TUMS  . Headache(784.0)   . Hypertension   . Kidney stones   . OSA (obstructive sleep apnea) 12/06/2011   initial sleep study  completed, CPAP /BiPAP TITRATION DONE 12/20/11  . Pneumonia   . ST elevation myocardial infarction (STEMI) of inferior wall ([place]) 10/23/2011   Proximal RCA occlusion treated with DES stent  . Type II diabetes mellitus ([place])    "dx'd today" (08/13/2013)    Past Surgical History:  Procedure Laterality Date  . LEFT HEART CATHETERIZATION WITH CORONARY ANGIOGRAM N/A 10/23/2011   Procedure: LEFT HEART CATHETERIZATION WITH CORONARY ANGIOGRAM;  Surgeon: [name], MD;  Location: [facility] CATH LAB;  Service: Cardiovascular;  Laterality: N/A;  . LUMBAR LAMINECTOMY/DECOMPRESSION MICRODISCECTOMY Right 08/17/2013   Procedure: LUMBAR FOUR TO FIVE LUMBAR LAMINECTOMY/DECOMPRESSION MICRODISCECTOMY 1 LEVEL;  Surgeon: Charlie Pitter, MD;  Location: [facility]URO ORS;  Service: Neurosurgery;  Laterality: Right;  . PERCUTANEOUS CORONARY STENT INTERVENTION (PCI-S)  10/23/2011   Procedure: PERCUTANEOUS CORONARY STENT INTERVENTION (PCI-S) - With Aspiration Thrombectomy;  Surgeon: [name], MD;  Location: [facility] CATH LAB;  Service: Cardiovascular;; PCI - proximal  RCA with  Promus ELEMENT  DES : 3.0 mm x 40mm ; taper post-dilation: 3.35-3.2 mm  . SHOULDER ARTHROSCOPY Right 1992  . TRANSTHORACIC ECHOCARDIOGRAM  10/25/2011   EF 77-82%;UM diastolic function normal ,systolic function normal  Echocardiogram 10/25/2011: EF 60 deficits 5% with concentric remodeling. Otherwise normal echo. Normal valves.  No outpatient prescriptions have been marked as taking for the 02/03/17 encounter (Office Visit) with [name], MD.  - not taking any medications  No Known Allergies  Social History   Social History  . Marital status: Divorced     Spouse name: N/A  . Number of children: N/A  . Years of education: N/A   Social History Main Topics  . Smoking status: Current Every Day Smoker    Packs/day: 1.00    Years: 4.00    Types: Cigars, Cigarettes  . Smokeless tobacco: Never Used     Comment: trying to quit - has not had any cigarettes or cigars for 3 weeks  . Alcohol use Yes  . Drug use: No     Comment: in the past  . Sexual activity: Yes   Other Topics Concern  . None   Social History Narrative  . None  He is a divorced father of 2 with a very close family around him. Usually works a Designer, multimedia. When I saw him last in 2013, he was exercising minutes a day for 7 days week. He smoked cigars, taken 3 days to smoke 1.  family history includes Coronary artery disease in his mother; Diabetes type II in his mother; Hypertension in his mother.  Wt Readings from Last 3 Encounters:  02/03/17 269 lb (122 kg)  01/06/17 300 lb (136.1 kg)  09/01/16 243 lb (110.2 kg)    PHYSICAL EXAM BP 138/90   Pulse 74   Ht  6\' 2"  (1.88 m)   Wt 269 lb (122 kg)   BMI 34.54 kg/m  Physical Exam  Constitutional: He is oriented to person, place, and time. He appears well-developed and well-nourished. No distress.  Mild to moderately obese. Well groomed  HENT:  Head: Normocephalic.  Neck: Trachea normal and normal range of motion. Neck supple. No hepatojugular reflux and no JVD present. Carotid bruit is not present.  Cardiovascular: Normal rate, regular rhythm, S1 normal, S2 normal and normal pulses.   Occasional extrasystoles are present. PMI is not displaced.  Exam reveals distant heart sounds. Exam reveals no gallop.   No murmur heard. Pulmonary/Chest: Effort normal and breath sounds normal. No respiratory distress. He has no rales.  Abdominal: Soft. Bowel sounds are normal. He exhibits no distension. There is no tenderness. There is no rebound and no guarding.  No flank tenderness  Musculoskeletal: Normal range of motion. He exhibits no  edema or deformity.  Neurological: He is alert and oriented to person, place, and time. No cranial nerve deficit.  Skin: Skin is warm and dry. No rash noted. No erythema.  Psychiatric: He has a normal mood and affect. His behavior is normal.  He seems to be very dismissive over the fact that he had an MI and that he should probably take care of himself.     Adult ECG Report Not checked  Other studies Reviewed: Additional studies/ records that were reviewed today include:  Recent Labs:  PCP checked labs last Dec.   ASSESSMENT / PLAN: Problem List Items Addressed This Visit    CAD S/P percutaneous coronary angioplasty - Primary (Chronic)    History of inferior STEMI 5 years ago. Has been lost to follow-up.thankfully, no further anginal symptoms. Unfortunately, he has gained weigh and not taking medications.  Plan:Restart atorvastatin 40 mg, baby aspirin and Toprol 25 mg daily.      Relevant Medications   aspirin EC 81 MG tablet   atorvastatin (LIPITOR) 40 MG tablet   metoprolol succinate (TOPROL XL) 25 MG 24 hr tablet   Essential hypertension (Chronic)    lood pressure is 138/90 on no medications. Would like to see his blood pressure little lower and I would like to see ha beta blocker --> We'll start Toprol 25 mg daily      Relevant Medications   aspirin EC 81 MG tablet   atorvastatin (LIPITOR) 40 MG tablet   metoprolol succinate (TOPROL XL) 25 MG 24 hr tablet   Hyperglycemia (Chronic)    With his history of elevated blood sugars, we will check hemoglobin A1c --> I suspect with his weight gain, he may very well have developed type 2 diabetes.      Relevant Orders   Comprehensive metabolic panel (Completed)   Comprehensive metabolic panel   Hemoglobin A1c (Completed)   Hyperlipidemia with target low density lipoprotein (LDL) cholesterol less than 70 mg/dL (Chronic)    I don't have any labs since last December.  For risk stratification since he is no longer taking  medications, I will check lipids today and start atorvastatin 40 daily. Recheck labs in 6 months.      Relevant Medications   aspirin EC 81 MG tablet   atorvastatin (LIPITOR) 40 MG tablet   metoprolol succinate (TOPROL XL) 25 MG 24 hr tablet   Other Relevant Orders   Lipid panel (Completed)   Comprehensive metabolic panel (Completed)   Lipid panel   Comprehensive metabolic panel   Obesity, Class II, BMI 35-39.9 (Chronic)  The patient understands the need to lose weight with diet and exercise. We have discussed specific strategies for this.      Presence of drug coated stent in right coronary artery - Promus 3.0 mm x 24 mm - post dilated to 3.3 mm     Well beyond the concerning. For her being on dual antiplatelet therapy.  I told him he should take at least a baby aspirin daily.      Relevant Orders   Comprehensive metabolic panel (Completed)   Comprehensive metabolic panel   Hemoglobin A1c (Completed)   STEMI (ST elevation myocardial infarction) ([facility]) (Chronic)    Inferior STEMI 5 years ago with no recurrent angina or heart failure symptoms. Not currently on medications. Restarting cardiac medications.      Relevant Medications   aspirin EC 81 MG tablet   atorvastatin (LIPITOR) 40 MG tablet   metoprolol succinate (TOPROL XL) 25 MG 24 hr tablet   Other Relevant Orders   Comprehensive metabolic panel (Completed)   Comprehensive metabolic panel   Hemoglobin A1c (Completed)   Tobacco use (Chronic)    He seems to be interested in quitting, but continues to have difficulty with the last final step. Smoking cessation instruction/counseling given:  counseled patient on the dangers of tobacco use, advised patient to stop smoking, and reviewed strategies to maximize success          Current medicines are reviewed at length with the patient today. (+/- concerns) n/a The following changes have been made: n/a  Patient Instructions  MEDICATIONS  START   ATORVASTATIN 40 MG  ONE TABLET  AT BEDTIME. START ASPIRIN 81 MG ONE TABLET DAILY  START TOPROL XL 25 MG ONE TABLET DAILY    LABS TODAY  CMP  LIPID  HGBA1C  IN 6 MONTHS WILL MAIL LAB SLIP TO HAVE LIPIDS /CHOLERSTEROL RECHECK-- LIPIDS,CMP  Your physician wants you to follow-up in [place].You will receive a reminder letter in the mail two months in advance. If you don't receive a letter, please call our office to schedule the follow-up appointment.  If you need a refill on your cardiac medications before your next appointment, please call your pharmacy.   He agreed annual follow-up. My preference would be to see him in close follow-up, however he did not seem interested in that. We will recheck his lipids in 6 months however.  Studies Ordered:   Orders Placed This Encounter  Procedures  . Lipid panel  . Comprehensive metabolic panel  . Lipid panel  . Comprehensive metabolic panel  . Hemoglobin A1c      Glenetta Hew, M.D., M.S. Interventional Cardiologist   Pager # [phone] Phone # [phone] [address] [place], [place] [address]   Lab Results  Component Value Date   CHOL 171 02/03/2017   HDL 45 02/03/2017   LDLCALC 107 (H) 02/03/2017   TRIG 95 02/03/2017   CHOLHDL 3.8 02/03/2017   Lab Results  Component Value Date   CREATININE 0.92 02/03/2017   BUN 13 02/03/2017   NA 141 02/03/2017   K 4.3 02/03/2017   CL 105 02/03/2017   CO2 22 02/03/2017   Lab Results  Component Value Date   HGBA1C 5.9 (H) 02/03/2017   Glenetta Hew, MD

## 2017-02-04 LAB — COMPREHENSIVE METABOLIC PANEL
A/G RATIO: 1.8 (ref 1.2–2.2)
ALBUMIN: 4.6 g/dL (ref 3.5–5.5)
ALT: 20 IU/L (ref 0–44)
AST: 21 IU/L (ref 0–40)
Alkaline Phosphatase: 79 IU/L (ref 39–117)
BILIRUBIN TOTAL: 0.3 mg/dL (ref 0.0–1.2)
BUN / CREAT RATIO: 14 (ref 9–20)
BUN: 13 mg/dL (ref 6–24)
CO2: 22 mmol/L (ref 20–29)
Calcium: 9.6 mg/dL (ref 8.7–10.2)
Chloride: 105 mmol/L (ref 96–106)
Creatinine, Ser: 0.92 mg/dL (ref 0.76–1.27)
GFR calc non Af Amer: 100 mL/min/{1.73_m2} (ref >59)
GFR, EST AFRICAN AMERICAN: 116 mL/min/{1.73_m2} (ref >59)
GLOBULIN, TOTAL: 2.6 g/dL (ref 1.5–4.5)
Glucose: 88 mg/dL (ref 65–99)
POTASSIUM: 4.3 mmol/L (ref 3.5–5.2)
SODIUM: 141 mmol/L (ref 134–144)
TOTAL PROTEIN: 7.2 g/dL (ref 6.0–8.5)

## 2017-02-04 LAB — LIPID PANEL
CHOL/HDL RATIO: 3.8 ratio (ref 0.0–5.0)
CHOLESTEROL TOTAL: 171 mg/dL (ref 100–199)
HDL: 45 mg/dL (ref >39)
LDL Calculated: 107 mg/dL — ABNORMAL HIGH (ref 0–99)
TRIGLYCERIDES: 95 mg/dL (ref 0–149)
VLDL Cholesterol Cal: 19 mg/dL (ref 5–40)

## 2017-02-04 LAB — HEMOGLOBIN A1C
Est. average glucose Bld gHb Est-mCnc: 123 mg/dL
Hgb A1c MFr Bld: 5.9 % — ABNORMAL HIGH (ref 4.8–5.6)

## 2017-02-05 ENCOUNTER — Encounter: Payer: Self-pay | Admitting: Cardiology

## 2017-02-05 NOTE — Assessment & Plan Note (Signed)
Well beyond the concerning. For her being on dual antiplatelet therapy.  I told him he should take at least a baby aspirin daily.

## 2017-02-05 NOTE — Assessment & Plan Note (Addendum)
I don't have any labs since last December.  For risk stratification since he is no longer taking medications, I will check lipids today and start atorvastatin 40 daily. Recheck labs in 6 months.

## 2017-02-05 NOTE — Assessment & Plan Note (Signed)
With his history of elevated blood sugars, we will check hemoglobin A1c --> I suspect with his weight gain, he may very well have developed type 2 diabetes.

## 2017-02-05 NOTE — Assessment & Plan Note (Signed)
He seems to be interested in quitting, but continues to have difficulty with the last final step. Smoking cessation instruction/counseling given:  counseled patient on the dangers of tobacco use, advised patient to stop smoking, and reviewed strategies to maximize success

## 2017-02-05 NOTE — Assessment & Plan Note (Signed)
Inferior STEMI 5 years ago with no recurrent angina or heart failure symptoms. Not currently on medications. Restarting cardiac medications.

## 2017-02-05 NOTE — Assessment & Plan Note (Addendum)
History of inferior STEMI 5 years ago. Has been lost to follow-up.thankfully, no further anginal symptoms. Unfortunately, he has gained weigh and not taking medications.  Plan:Restart atorvastatin 40 mg, baby aspirin and Toprol 25 mg daily.

## 2017-02-05 NOTE — Assessment & Plan Note (Signed)
lood pressure is 138/90 on no medications. Would like to see his blood pressure little lower and I would like to see ha beta blocker --> We'll start Toprol 25 mg daily

## 2017-02-05 NOTE — Assessment & Plan Note (Signed)
The patient understands the need to lose weight with diet and exercise. We have discussed specific strategies for this.  

## 2017-02-16 ENCOUNTER — Telehealth: Payer: Self-pay | Admitting: *Deleted

## 2017-02-16 NOTE — Telephone Encounter (Signed)
LEFT MESSAGE TO [facility] BACK- LABS

## 2017-02-16 NOTE — Telephone Encounter (Signed)
-----   Message from [name], MD sent at 02/11/2017  2:27 PM EDT ----- Not bad overall baseline cholesterol levels. Total cholesterol 171, triglycerides 95, HDL 45 which is much better than it was. LDL is 107Since the triglycerides were stable, which it measured LDL. Overall think his goal LDL still less than 77.  Need to stay on medicine. Kidney function looks okay. Liver functions look okay. He will A1c looks a lot better and it did 3 years ago was 5.9 which is just borderline. -Probably needs to get back in to see PCP  Glenetta Hew, MD

## 2017-04-27 ENCOUNTER — Encounter: Payer: Self-pay | Admitting: *Deleted

## 2017-04-27 NOTE — Telephone Encounter (Signed)
LETTER MAILED WITH RESULT

## 2017-07-08 ENCOUNTER — Telehealth: Payer: Self-pay | Admitting: *Deleted

## 2017-07-08 DIAGNOSIS — Z955 Presence of coronary angioplasty implant and graft: Secondary | ICD-10-CM

## 2017-07-08 DIAGNOSIS — R739 Hyperglycemia, unspecified: Secondary | ICD-10-CM

## 2017-07-08 DIAGNOSIS — E785 Hyperlipidemia, unspecified: Secondary | ICD-10-CM

## 2017-07-08 DIAGNOSIS — I2111 ST elevation (STEMI) myocardial infarction involving right coronary artery: Secondary | ICD-10-CM

## 2017-07-08 NOTE — Telephone Encounter (Signed)
-----   Message from [name], RN sent at 02/03/2017  9:02 AM EDT ----- LABS DUE IN August 03 2017 LIPID,CMP WILL MAIL @ Jul 06 2017

## 2017-07-08 NOTE — Telephone Encounter (Signed)
Mailed letter and labslip 

## 2018-09-20 ENCOUNTER — Encounter (HOSPITAL_COMMUNITY): Payer: Self-pay

## 2018-09-20 ENCOUNTER — Other Ambulatory Visit: Payer: Self-pay

## 2018-09-20 ENCOUNTER — Emergency Department (HOSPITAL_COMMUNITY)
Admission: EM | Admit: 2018-09-20 | Discharge: 2018-09-20 | Disposition: A | Discharge status: Home / Self Care | Payer: 59 | Attending: Emergency Medicine | Admitting: Emergency Medicine

## 2018-09-20 DIAGNOSIS — R350 Frequency of micturition: Secondary | ICD-10-CM | POA: Diagnosis present

## 2018-09-20 DIAGNOSIS — Z7982 Long term (current) use of aspirin: Secondary | ICD-10-CM | POA: Diagnosis not present

## 2018-09-20 DIAGNOSIS — I10 Essential (primary) hypertension: Secondary | ICD-10-CM | POA: Insufficient documentation

## 2018-09-20 DIAGNOSIS — E1165 Type 2 diabetes mellitus with hyperglycemia: Secondary | ICD-10-CM | POA: Insufficient documentation

## 2018-09-20 DIAGNOSIS — Z955 Presence of coronary angioplasty implant and graft: Secondary | ICD-10-CM | POA: Diagnosis not present

## 2018-09-20 DIAGNOSIS — I2510 Atherosclerotic heart disease of native coronary artery without angina pectoris: Secondary | ICD-10-CM | POA: Diagnosis not present

## 2018-09-20 DIAGNOSIS — F17210 Nicotine dependence, cigarettes, uncomplicated: Secondary | ICD-10-CM | POA: Diagnosis not present

## 2018-09-20 LAB — POCT I-STAT EG7
Acid-base deficit: 1 mmol/L (ref 0.0–2.0)
Bicarbonate: 23.3 mmol/L (ref 20.0–28.0)
Calcium, Ion: 1.25 mmol/L (ref 1.15–1.40)
HCT: 49 % (ref 39.0–52.0)
Hemoglobin: 16.7 g/dL (ref 13.0–17.0)
O2 Saturation: 96 %
Potassium: 3.5 mmol/L (ref 3.5–5.1)
Sodium: 135 mmol/L (ref 135–145)
TCO2: 24 mmol/L (ref 22–32)
pCO2, Ven: 36.9 mmHg — ABNORMAL LOW (ref 44.0–60.0)
pH, Ven: 7.408 (ref 7.250–7.430)
pO2, Ven: 78 mmHg — ABNORMAL HIGH (ref 32.0–45.0)

## 2018-09-20 LAB — COMPREHENSIVE METABOLIC PANEL
ALT: 28 U/L (ref 0–44)
AST: 18 U/L (ref 15–41)
Albumin: 4.1 g/dL (ref 3.5–5.0)
Alkaline Phosphatase: 103 U/L (ref 38–126)
Anion gap: 11 (ref 5–15)
BUN: 10 mg/dL (ref 6–20)
CO2: 22 mmol/L (ref 22–32)
Calcium: 9.3 mg/dL (ref 8.9–10.3)
Chloride: 100 mmol/L (ref 98–111)
Creatinine, Ser: 1.03 mg/dL (ref 0.61–1.24)
GFR calc Af Amer: >60 mL/min (ref >60)
GFR calc non Af Amer: >60 mL/min (ref >60)
Glucose, Bld: 422 mg/dL — ABNORMAL HIGH (ref 70–99)
Potassium: 3.7 mmol/L (ref 3.5–5.1)
Sodium: 133 mmol/L — ABNORMAL LOW (ref 135–145)
Total Bilirubin: 0.8 mg/dL (ref 0.3–1.2)
Total Protein: 7.3 g/dL (ref 6.5–8.1)

## 2018-09-20 LAB — PHOSPHORUS: Phosphorus: 3.2 mg/dL (ref 2.5–4.6)

## 2018-09-20 LAB — CBC WITH DIFFERENTIAL/PLATELET
Abs Immature Granulocytes: 0.01 10*3/uL (ref 0.00–0.07)
Basophils Absolute: 0.1 10*3/uL (ref 0.0–0.1)
Basophils Relative: 1 %
Eosinophils Absolute: 0.3 10*3/uL (ref 0.0–0.5)
Eosinophils Relative: 4 %
HCT: 51.8 % (ref 39.0–52.0)
Hemoglobin: 16.9 g/dL (ref 13.0–17.0)
Immature Granulocytes: 0 %
Lymphocytes Relative: 37 %
Lymphs Abs: 3.2 10*3/uL (ref 0.7–4.0)
MCH: 27.7 pg (ref 26.0–34.0)
MCHC: 32.6 g/dL (ref 30.0–36.0)
MCV: 84.8 fL (ref 80.0–100.0)
Monocytes Absolute: 0.4 10*3/uL (ref 0.1–1.0)
Monocytes Relative: 5 %
Neutro Abs: 4.7 10*3/uL (ref 1.7–7.7)
Neutrophils Relative %: 53 %
Platelets: 200 10*3/uL (ref 150–400)
RBC: 6.11 MIL/uL — ABNORMAL HIGH (ref 4.22–5.81)
RDW: 12.8 % (ref 11.5–15.5)
WBC: 8.7 10*3/uL (ref 4.0–10.5)
nRBC: 0 % (ref 0.0–0.2)

## 2018-09-20 LAB — TSH: TSH: 2.177 u[IU]/mL (ref 0.350–4.500)

## 2018-09-20 LAB — MAGNESIUM: Magnesium: 2.1 mg/dL (ref 1.7–2.4)

## 2018-09-20 LAB — CBG MONITORING, ED: Glucose-Capillary: 433 mg/dL — ABNORMAL HIGH (ref 70–99)

## 2018-09-20 LAB — HEMOGLOBIN A1C
Hgb A1c MFr Bld: 11.2 % — ABNORMAL HIGH (ref 4.8–5.6)
Mean Plasma Glucose: 274.74 mg/dL

## 2018-09-20 MED ORDER — SODIUM CHLORIDE 0.9 % IV BOLUS
1000.0000 mL | Freq: Once | INTRAVENOUS | Status: AC
  Administered 2018-09-20: 17:00:00 1000 mL via INTRAVENOUS

## 2018-09-20 MED ORDER — METFORMIN HCL 500 MG PO TABS
1000.0000 mg | ORAL_TABLET | Freq: Once | ORAL | Status: AC
  Administered 2018-09-20: 1000 mg via ORAL
  Filled 2018-09-20: qty 2

## 2018-09-20 NOTE — Discharge Instructions (Signed)
1.  Start following a diabetes eating plan. 2.  Start taking metformin 500 mg twice daily.  You have been given your first dose in the emergency department for tonight.  Can fill your prescription tomorrow morning. 3.  Check your blood sugars 3 times a day.  Do not take the metformin if your blood sugar is below 150.  Mediately check your blood sugar if you feel shaky, nauseated, sweaty or weak.  These are signs of possible low blood sugar. 4.  Return to the emergency department if you are having any worsening symptoms or concerns. 5.  It is very important that you get scheduled with your family doctor as soon as possible.  They will need to monitor your response to treatment and make adjustments.  You also will need reevaluation and treatment for high blood pressure.

## 2018-09-20 NOTE — ED Triage Notes (Signed)
Pt reports hyperglycemia x2 months. Pt states he hasn't been on insulin for multiple years or have a PCP. Denies N/V.

## 2018-09-20 NOTE — ED Provider Notes (Signed)
[facility] EMERGENCY DEPARTMENT Provider Note   CSN: [id number] Arrival date & time: 09/20/18  1629    History   Chief Complaint Chief Complaint  Patient presents with  . Hyperglycemia    HPI George Dickson is a 48 y.o. male.     HPI Patient reports that he used to be treated for diabetes, hypercholesterolemia and hypertension in 2015.  He reports by 2017 he had those conditions under control and was taken off of medications.  He reports that he did not really do much follow-up in the interim time.  For 2 months now he reports that he has had increased frequency of urination, been very thirsty, felt generalized weakness and has had a "bad taste in his mouth".  He reports he has been checking his blood sugars at home and they have been running from 250, sometimes up to 500 after eating.  Today he decided he finally needed to address this and comes to the hospital.  He reports he does have a family doctor but has not been seen in approximately 3 years. Past Medical History:  Diagnosis Date  . Anxiety   . CAD S/P percutaneous coronary angioplasty 10/2011   Insetting of inferior STEMI. Proximal RCA PCI with Promus Element DES - 3.0 mm x 24 mm (3.35-3.2 tapered)  . Dyslipidemia   . GERD (gastroesophageal reflux disease)    occasionally takes TUMS  . Headache(784.0)   . Hypertension   . Kidney stones   . OSA (obstructive sleep apnea) 12/06/2011   initial sleep study  completed, CPAP /BiPAP TITRATION DONE 12/20/11  . Pneumonia   . ST elevation myocardial infarction (STEMI) of inferior wall ([place]) 10/23/2011   Proximal RCA occlusion treated with DES stent  . Type II diabetes mellitus ([place])    "dx'd today" (08/13/2013)    Patient Active Problem List   Diagnosis Date Noted  . Hyperlipidemia with target low density lipoprotein (LDL) cholesterol less than 70 mg/dL 02/03/2017  . Unstable angina ([place]) 01/07/2017  . Atypical chest pain 01/07/2017  . Nephrolithiasis  01/07/2017  . HNP (herniated nucleus pulposus), lumbar 08/17/2013  . 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 DM (diabetes mellitus) ([place]) 08/15/2013  . Hyperglycemia 08/13/2013  . Hyperglycemic hyperosmolar nonketotic state 08/13/2013  . Essential hypertension 08/13/2013  . Presence of drug coated stent in right coronary artery - Promus 3.0 mm x 24 mm - post dilated to 3.3 mm  10/24/2011    Class: Acute  . Tobacco use 10/23/2011  . STEMI (ST elevation myocardial infarction) ([place]) 10/23/2011  . Obesity, Class II, BMI 35-39.9 10/23/2011  . CAD S/P percutaneous coronary angioplasty 10/23/2011    Past Surgical History:  Procedure Laterality Date  . LEFT HEART CATHETERIZATION WITH CORONARY ANGIOGRAM N/A 10/23/2011   Procedure: LEFT HEART CATHETERIZATION WITH CORONARY ANGIOGRAM;  Surgeon: [name], MD;  Location: [facility] CATH LAB;  Service: Cardiovascular;  Laterality: N/A;  . LUMBAR LAMINECTOMY/DECOMPRESSION MICRODISCECTOMY Right 08/17/2013   Procedure: LUMBAR FOUR TO FIVE LUMBAR LAMINECTOMY/DECOMPRESSION MICRODISCECTOMY 1 LEVEL;  Surgeon: Charlie Pitter, MD;  Location: [facility]URO ORS;  Service: Neurosurgery;  Laterality: Right;  . PERCUTANEOUS CORONARY STENT INTERVENTION (PCI-S)  10/23/2011   Procedure: PERCUTANEOUS CORONARY STENT INTERVENTION (PCI-S) - With Aspiration Thrombectomy;  Surgeon: [name], MD;  Location: [facility] CATH LAB;  Service: Cardiovascular;; PCI - proximal  RCA with  Promus ELEMENT  DES : 3.0 mm x 17mm ; taper post-dilation: 3.35-3.2 mm  . SHOULDER ARTHROSCOPY Right  [place] ECHOCARDIOGRAM  10/25/2011   EF 56-31%;SH diastolic function normal ,systolic function normal        Home Medications    Prior to Admission medications   Medication Sig Start Date End Date Taking? Authorizing Provider  aspirin EC 81 MG tablet Take 1 tablet (81 mg total) by mouth daily. 02/03/17  Yes [name], MD  atorvastatin (LIPITOR) 40 MG tablet Take 1 tablet (40  mg total) by mouth daily. Patient not taking: Reported on 09/20/2018 02/03/17 05/04/17  [name], MD  metoprolol succinate (TOPROL XL) 25 MG 24 hr tablet Take 1 tablet (25 mg total) by mouth daily. Patient not taking: Reported on 09/20/2018 02/03/17   [name], MD    Family History Family History  Problem Relation Age of Onset  . Coronary artery disease Mother   . Hypertension Mother   . Diabetes type II Mother     Social History Social History   Tobacco Use  . Smoking status: Current Every Day Smoker    Packs/day: 1.00    Years: 4.00    Pack years: 4.00    Types: Cigars, Cigarettes  . Smokeless tobacco: Never Used  . Tobacco comment: trying to quit - has not had any cigarettes or cigars for 3 weeks  Substance Use Topics  . Alcohol use: Yes  . Drug use: No    Comment: in the past     Allergies   Patient has no known allergies.   Review of Systems Review of Systems 10 Systems reviewed and are negative for acute change except as noted in the HPI.   Physical Exam Updated Vital Signs BP (!) 163/86 (BP Location: Right Arm)   Pulse 79   Temp 98.6 F (37 C) (Oral)   Resp 18   Ht 6\' 3"  (1.905 m)   Wt 131.5 kg   SpO2 96%   BMI 36.25 kg/m   Physical Exam Constitutional:      Appearance: He is well-developed.  HENT:     Head: Normocephalic and atraumatic.  Eyes:     Extraocular Movements: Extraocular movements intact.     Pupils: Pupils are equal, round, and reactive to light.  Neck:     Musculoskeletal: Neck supple.  Cardiovascular:     Rate and Rhythm: Normal rate and regular rhythm.     Heart sounds: Normal heart sounds.  Pulmonary:     Effort: Pulmonary effort is normal.     Breath sounds: Normal breath sounds.  Abdominal:     General: Bowel sounds are normal. There is no distension.     Palpations: Abdomen is soft.     Tenderness: There is no abdominal tenderness.  Musculoskeletal: Normal range of motion.        General: No swelling or  tenderness.  Skin:    General: Skin is warm and dry.  Neurological:     Mental Status: He is alert and oriented to person, place, and time.     GCS: GCS eye subscore is 4. GCS verbal subscore is 5. GCS motor subscore is 6.     Cranial Nerves: No cranial nerve deficit.     Coordination: Coordination normal.  Psychiatric:        Mood and Affect: Mood normal.      ED Treatments / Results  Labs (all labs ordered are listed, but only abnormal results are displayed) Labs Reviewed  COMPREHENSIVE METABOLIC PANEL - Abnormal; Notable for the following components:  Result Value   Sodium 133 (*)    Glucose, Bld 422 (*)    All other components within normal limits  CBC WITH DIFFERENTIAL/PLATELET - Abnormal; Notable for the following components:   RBC 6.11 (*)    All other components within normal limits  HEMOGLOBIN A1C - Abnormal; Notable for the following components:   Hgb A1c MFr Bld 11.2 (*)    All other components within normal limits  CBG MONITORING, ED - Abnormal; Notable for the following components:   Glucose-Capillary 433 (*)    All other components within normal limits  POCT I-STAT EG7 - Abnormal; Notable for the following components:   pCO2, Ven 36.9 (*)    pO2, Ven 78.0 (*)    All other components within normal limits  MAGNESIUM  PHOSPHORUS  TSH  URINALYSIS, ROUTINE W REFLEX MICROSCOPIC  BLOOD GAS, VENOUS  RAPID URINE DRUG SCREEN, HOSP PERFORMED    EKG None  Radiology No results found.  Procedures Procedures (including critical care time)  Medications Ordered in ED Medications  metFORMIN (GLUCOPHAGE) tablet 1,000 mg (has no administration in time range)  sodium chloride 0.9 % bolus 1,000 mL (1,000 mLs Intravenous New Bag/Given 09/20/18 1711)     Initial Impression / Assessment and Plan / ED Course  I have reviewed the triage vital signs and the nursing notes.  Pertinent labs & imaging results that were available during my care of the patient were  reviewed by me and considered in my medical decision making (see chart for details).       Patient is clinically well in appearance.  He has known that his blood sugars have been elevating over the past 2 months.  He has become more symptomatic with urinary frequency, thirst and fatigue.  Sugar is elevated today but no signs of hyperosmolar crisis.  Patient is clinically well and stable to start outpatient management.  Directions provided and return precautions reviewed.  Final Clinical Impressions(s) / ED Diagnoses   Final diagnoses:  Type 2 diabetes mellitus with hyperglycemia, without long-term current use of insulin [facility])  Essential hypertension    ED Discharge Orders    None       Charlesetta Shanks, MD 09/20/18 1818

## 2019-07-27 ENCOUNTER — Encounter: Payer: Self-pay | Admitting: General Practice

## 2019-08-09 ENCOUNTER — Encounter: Payer: Self-pay | Admitting: Cardiology

## 2019-08-09 ENCOUNTER — Other Ambulatory Visit: Payer: Self-pay

## 2019-08-09 ENCOUNTER — Ambulatory Visit: Payer: 59 | Admitting: Cardiology

## 2019-08-09 VITALS — BP 115/90 | HR 92 | Temp 97.0°F | Ht 74.0 in | Wt 281.4 lb

## 2019-08-09 DIAGNOSIS — E785 Hyperlipidemia, unspecified: Secondary | ICD-10-CM | POA: Diagnosis not present

## 2019-08-09 DIAGNOSIS — I2111 ST elevation (STEMI) myocardial infarction involving right coronary artery: Secondary | ICD-10-CM

## 2019-08-09 DIAGNOSIS — E118 Type 2 diabetes mellitus with unspecified complications: Secondary | ICD-10-CM | POA: Diagnosis not present

## 2019-08-09 DIAGNOSIS — I10 Essential (primary) hypertension: Secondary | ICD-10-CM

## 2019-08-09 DIAGNOSIS — Z720 Tobacco use: Secondary | ICD-10-CM | POA: Diagnosis not present

## 2019-08-09 DIAGNOSIS — I25119 Atherosclerotic heart disease of native coronary artery with unspecified angina pectoris: Secondary | ICD-10-CM | POA: Diagnosis not present

## 2019-08-09 DIAGNOSIS — E669 Obesity, unspecified: Secondary | ICD-10-CM

## 2019-08-09 DIAGNOSIS — Z794 Long term (current) use of insulin: Secondary | ICD-10-CM

## 2019-08-09 DIAGNOSIS — Z955 Presence of coronary angioplasty implant and graft: Secondary | ICD-10-CM | POA: Diagnosis not present

## 2019-08-09 MED ORDER — ATORVASTATIN CALCIUM 40 MG PO TABS: 40.0000 mg | ORAL_TABLET | Freq: Every day | ORAL | 3 refills | Status: DC

## 2019-08-09 MED ORDER — NITROGLYCERIN 0.4 MG SL SUBL: 0.4000 mg | SUBLINGUAL_TABLET | SUBLINGUAL | 6 refills | Status: DC | PRN

## 2019-08-09 NOTE — Patient Instructions (Addendum)
Medication Instructions:  Restart Atorvastati 40 mg daily   take as needed - Nitroglycerin 0.4 mg  For chest  Discomfort - under the tongue up to 3 times , 5 minutes apart . *If you need a refill on your cardiac medications before your next appointment, please call your pharmacy*   Lab Work:  today lipid cmp  hgb a1c And 6 month from now. If you have labs (blood work) drawn today and your tests are completely normal, you will receive your results only by: Marland Kitchen MyChart Message (if you have MyChart) OR . A paper copy in the mail If you have any lab test that is abnormal or we need to change your treatment, we will call you to review the results.   Testing/Procedures: Not needed  Follow-Up: At [facility], you and your health needs are our priority.  As part of our continuing mission to provide you with exceptional heart care, we have created designated Provider Care Teams.  These Care Teams include your primary Cardiologist (physician) and Advanced Practice Providers (APPs -  Physician Assistants and Nurse Practitioners) who all work together to provide you with the care you need, when you need it.  We recommend signing up for the patient portal called "MyChart".  Sign up information is provided on this After Visit Summary.  MyChart is used to connect with patients for Virtual Visits (Telemedicine).  Patients are able to view lab/test results, encounter notes, upcoming appointments, etc.  Non-urgent messages can be sent to your provider as well.   To learn more about what you can do with MyChart, go to [email or web address].    Your next appointment:   6 month(s)  The format for your next appointment:   In Person  Provider:   Glenetta Hew, MD   Other Instructions Contact your primary  Doctor Dr Ayesha Rumpf [phone])  - reestablish for following your diabetes  PATIENT WOULD LIKE TO [place]  TO [address]

## 2019-08-09 NOTE — Progress Notes (Signed)
Primary Care Provider: Lin Landsman, MD Cardiologist: No primary care provider on file. Electrophysiologist: None  Clinic Note: Chief Complaint  Patient presents with  . Follow-up    Delayed follow-up  . Coronary Artery Disease    No longer on any meds.    HPI:    George Dickson is a 49 y.o. male with a PMH notable for CAD-PCI to the RCA is a inferior STEMI back in June 2013,who presents today for 16-month follow-up  CAD History:  Inferior STEMI on November 12, 2011 -> had minimal troponin elevation with normal echo post MI discharge fast-track.    Has been inconsistent with his medications.    Have was referred for evaluation for OSA but never followed up.  -> Evaluated in August 2017 for chest pain.  Had a negative cardiac evaluation at that time.  [name] was last seen in September 2018.  At that time was noted to have been lost to follow-up.  He gained weight, and was not taking medications.  We restarted atorvastatin, aspirin and Toprol 25 mg daily.  Smoking cessation counseling Labs were ordered including CMP A1c and lipid panel.  Recent Hospitalizations:  n/a  Reviewed  CV studies:    The following studies were reviewed today: (if available, images/films reviewed: From Epic Chart or Care Everywhere) . n/a:   Interval History:   [name] returns here today for a very delayed follow-up indicating that he has been doing relatively well, but he has not seen his PCP in quite some time.  He is no longer on any medicines besides taking aspirin and "some type of insulin ".  He has had significant nocturia and urinary frequency.  He has not had any chest pain that would be reminiscent of his MI, but does have off and on sharp chest pains here and there.  He is not overly active, but has not noticed any exertional chest pain or pressure.  No dyspnea. No heart failure symptoms or no arrhythmia symptoms besides occasional palpitations. He generally feels tired and  fatigued with no real energy level.  CV Review of Symptoms (Summary)positive for - Fatigue, rare palpitations. negative for - edema, irregular heartbeat, orthopnea, paroxysmal nocturnal dyspnea, rapid heart rate, shortness of breath or Syncope/near syncope, TIA/amaurosis fugax, claudication  The patient does not have symptoms concerning for COVID-19 infection (fever, chills, cough, or new shortness of breath).  The patient is practicing social distancing & Masking.    REVIEWED OF SYSTEMS   Review of Systems  Constitutional: Positive for malaise/fatigue (Not a lot of energy but not fatigued ). Negative for weight loss (He had gained about 20 pounds in the last 10 back.).  HENT: Negative for nosebleeds.   Respiratory: Negative for wheezing.   Cardiovascular: Positive for chest pain (Noted in HPI).  Gastrointestinal: Positive for heartburn. Negative for blood in stool and melena.  Genitourinary: Positive for frequency.  Musculoskeletal: Positive for myalgias (Off-and-on cramps). Negative for falls and joint pain.  Neurological: Negative for dizziness and weakness.  Psychiatric/Behavioral: Negative for depression and memory loss. The patient is not nervous/anxious and does not have insomnia.    I have reviewed and (if needed) personally updated the patient's problem list, medications, allergies, past medical and surgical history, social and family history.   PAST MEDICAL HISTORY   Past Medical History:  Diagnosis Date  . Anxiety   . CAD S/P percutaneous coronary angioplasty 10/2011   Insetting of inferior STEMI. Proximal RCA PCI with  Promus Element DES - 3.0 mm x 24 mm (3.35-3.2 tapered)  . Dyslipidemia   . GERD (gastroesophageal reflux disease)    occasionally takes TUMS  . Headache(784.0)   . Hypertension   . Kidney stones   . OSA (obstructive sleep apnea) 12/06/2011   initial sleep study  completed, CPAP /BiPAP TITRATION DONE 12/20/11  . Pneumonia   . ST elevation myocardial  infarction (STEMI) of inferior wall ([place]) 10/23/2011   Proximal RCA occlusion treated with DES stent  . Type II diabetes mellitus ([place])    "dx'd today" (08/13/2013)    PAST SURGICAL HISTORY   Past Surgical History:  Procedure Laterality Date  . LEFT HEART CATHETERIZATION WITH CORONARY ANGIOGRAM N/A 10/23/2011   Procedure: LEFT HEART CATHETERIZATION WITH CORONARY ANGIOGRAM;  Surgeon: [name], MD;  Location[facility] CATH LAB;  Service: Cardiovascular;  Laterality: N/A;  . LUMBAR LAMINECTOMY/DECOMPRESSION MICRODISCECTOMY Right 08/17/2013   Procedure: LUMBAR FOUR TO FIVE LUMBAR LAMINECTOMY/DECOMPRESSION MICRODISCECTOMY 1 LEVEL;  Surgeon: Charlie Pitter, MD;  Location: [facility] NEURO ORS;  Service: Neurosurgery;  Laterality: Right;  . PERCUTANEOUS CORONARY STENT INTERVENTION (PCI-S)  10/23/2011   Procedure: PERCUTANEOUS CORONARY STENT INTERVENTION (PCI-S) - With Aspiration Thrombectomy;  Surgeon: [name], MD;  Location: [facility] CATH LAB;  Service: Cardiovascular;; PCI - proximal  RCA with  Promus ELEMENT  DES : 3.0 mm x 67mm ; taper post-dilation: 3.35-3.2 mm  . SHOULDER ARTHROSCOPY Right 1992  . TRANSTHORACIC ECHOCARDIOGRAM  10/25/2011   EF [id number] diastolic function normal ,systolic function normal    MEDICATIONS/ALLERGIES   No outpatient medications have been marked as taking for the 08/09/19 encounter (Office Visit) with [name], MD.  ASA 81 mg He is on 'SOME TYPE OF INSULIN" - 1 was 1 x daily - + QAC coverage. -- ran out of both.    No Known Allergies  SOCIAL HISTORY/FAMILY HISTORY   Reviewed in Epic: Pertinent findings: - Working @ [employer]; he quit drinking as of November 2020, still having issues with full smoking cessation.  Smoking anywhere between 1/4-2/3 of a pack a day.  OBJCTIVE -PE, EKG, labs   Wt Readings from Last 3 Encounters:  08/09/19 281 lb 6.4 oz (127.6 kg)  09/20/18 290 lb (131.5 kg)  02/03/17 269 lb (122 kg)    Physical Exam: BP 115/90   Pulse 92    Temp (!) 97 F (36.1 C)   Ht 6\' 2"  (1.88 m)   Wt 281 lb 6.4 oz (127.6 kg)   SpO2 96%   BMI 36.13 kg/m  Physical Exam  Constitutional: He is oriented to person, place, and time. He appears well-developed and well-nourished. No distress.  Healthy-appearing.  Well-groomed.  HENT:  Head: Normocephalic and atraumatic.  Neck: No hepatojugular reflux and no JVD present. Carotid bruit is not present.  Cardiovascular: Normal rate, regular rhythm, S1 normal, S2 normal and intact distal pulses.  No extrasystoles are present. PMI is not displaced. Exam reveals distant heart sounds. Exam reveals no gallop.  No murmur heard. Pulmonary/Chest: Effort normal and breath sounds normal. No respiratory distress. He has no wheezes.  Musculoskeletal:        General: No tenderness or edema. Normal range of motion.     Cervical back: Normal range of motion and neck supple.  Neurological: He is alert and oriented to person, place, and time.  Psychiatric: He has a normal mood and affect. His behavior is normal. Judgment and thought content normal.  Vitals reviewed.  Adult ECG Report  Rate: 92 ;  Rhythm: normal sinus rhythm; normal axis, intervals & durations.  Narrative Interpretation: normal EKG  Recent Labs:    Lab Results  Component Value Date   CHOL 227 (H) 08/09/2019   HDL 26 (L) 08/09/2019   LDLCALC 87 08/09/2019   TRIG 696 (HH) 08/09/2019   CHOLHDL 8.7 (H) 08/09/2019   Lab Results  Component Value Date   CREATININE 1.07 08/09/2019   BUN 12 08/09/2019   NA 135 08/09/2019   K 4.5 08/09/2019   CL 96 08/09/2019   CO2 20 08/09/2019   Lab Results  Component Value Date   TSH 2.177 09/20/2018    ASSESSMENT/PLAN    Problem List Items Addressed This Visit    ST elevation myocardial infarction (STEMI) involving right coronary artery without development of Q waves (HCC) (Chronic)    Distant history of MI or a young age.  He had relatively low troponin elevation and no significant change  in wall motion on echocardiogram. Unfortunately, the lack of severe symptoms and residual effect, has made him somewhat carefree his self-management.  No longer on medications besides aspirin and some type of insulin.  Thankfully, no recurrent angina or heart failure symptoms.      Relevant Medications   atorvastatin (LIPITOR) 40 MG tablet   nitroGLYCERIN (NITROSTAT) 0.4 MG SL tablet   Other Relevant Orders   EKG 12-Lead   Lipid panel (Completed)   Comprehensive metabolic panel (Completed)   Hemoglobin A1c (Completed)   Lipid panel   Comprehensive metabolic panel   Hemoglobin A1c   Presence of drug coated stent in right coronary artery - Promus 3.0 mm x 24 mm - post dilated to 3.3 mm  (Chronic)    Large caliber stent in the RCA.  We are well beyond the window of stent thrombosis.  No longer on dual antiplatelets.  Is on aspirin alone.      Relevant Orders   EKG 12-Lead   Lipid panel (Completed)   Comprehensive metabolic panel (Completed)   Hemoglobin A1c (Completed)   Lipid panel   Comprehensive metabolic panel   Hemoglobin A1c   Coronary artery disease involving native coronary artery of native heart with angina pectoris ([place]) - Primary (Chronic)    Just about 8 years out from his inferior STEMI.  Thankfully, despite not being on any real medication regimen, he is doing relatively well with no further symptoms.  Plan:  Restart atorvastatin 40 mg.  With normal blood pressure and him having some fatigue I am leery of restarting beta-blocker at this point.  Continue aspirin 81 mg daily.  Given his current blood pressure no room for ARB or ACE inhibitor despite him being a diabetic.  Refill as needed nitroglycerin.      Relevant Medications   atorvastatin (LIPITOR) 40 MG tablet   nitroGLYCERIN (NITROSTAT) 0.4 MG SL tablet   Other Relevant Orders   EKG 12-Lead   Lipid panel (Completed)   Comprehensive metabolic panel (Completed)   Hemoglobin A1c (Completed)   Lipid  panel   Comprehensive metabolic panel   Hemoglobin A1c   Type 2 diabetes mellitus with complication, with long-term current use of insulin (HCC) (Chronic)    We will check an A1c to see where he stands, it sounds he is having lots of urinary frequency and lightheadedness associated with high blood sugars.  He has not had his labs checked in a while.  Needs to get back in touch with his PCP to  reevaluate treatment options.  I do not even know what he is taking, but it sounds like he is taking a long-acting insulin such as Levemir.      Relevant Medications   atorvastatin (LIPITOR) 40 MG tablet   Other Relevant Orders   EKG 12-Lead   Lipid panel (Completed)   Comprehensive metabolic panel (Completed)   Hemoglobin A1c (Completed)   Lipid panel   Comprehensive metabolic panel   Hemoglobin A1c   Essential hypertension (Chronic)    Listed as a diagnosis, but currently his blood pressure is relatively low not on any medications.  He does have a heart rate of 92 and has diabetes.  Would like to have him on a low-dose beta-blocker and ACE inhibitor, but would need to make sure that he is having routine follow-up.      Relevant Medications   atorvastatin (LIPITOR) 40 MG tablet   nitroGLYCERIN (NITROSTAT) 0.4 MG SL tablet   Hyperlipidemia with target low density lipoprotein (LDL) cholesterol less than 70 mg/dL (Chronic)    He never did get his labs checked back in 2018.  We will check lipid panel chemistry panel and A1c now and then again in 3 to 4 months just to reassess.  Restarting atorvastatin 40 mg.      Relevant Medications   atorvastatin (LIPITOR) 40 MG tablet   nitroGLYCERIN (NITROSTAT) 0.4 MG SL tablet   Other Relevant Orders   Lipid panel (Completed)   Comprehensive metabolic panel (Completed)   Hemoglobin A1c (Completed)   Lipid panel   Comprehensive metabolic panel   Hemoglobin A1c   Tobacco use (Chronic)    Counseling provided.  He did indicate that it was hard to  consider doing both smoking and alcohol cessation.  Once he is able to solidify his alcohol cessation, he wants to start working on smoking cessation.  Not ready at this point.      Obesity, Class II, BMI 35-39.9 (Chronic)    The patient understands the need to lose weight with diet and exercise. We have discussed specific strategies for this.         COVID-19 Education: The signs and symptoms of COVID-19 were discussed with the patient and how to seek care for testing (follow up with PCP or arrange E-visit).   The importance of social distancing was discussed today.  I spent a total of 22 minutes with the patient. >  50% of the time was spent in direct patient consultation.  Additional time spent with chart review  / charting (studies, outside notes, etc): 8 Total Time: 30 min   Current medicines are reviewed at length with the patient today.  (+/- concerns) has not really been taking all the medications that we suggested.  Prescriptions ran out, and he did not think that the meds will be continued beyond initial prescription.   Patient Instructions / Medication Changes & Studies & Tests Ordered   Patient Instructions  Medication Instructions:  Restart Atorvastati 40 mg daily   take as needed - Nitroglycerin 0.4 mg  For chest  Discomfort - under the tongue up to 3 times , 5 minutes apart . *If you need a refill on your cardiac medications before your next appointment, please call your pharmacy*   Lab Work:  today lipid cmp  hgb a1c And 6 month from now. If you have labs (blood work) drawn today and your tests are completely normal, you will receive your results only by: Marland Kitchen MyChart Message (if you have MyChart)  OR . A paper copy in the mail If you have any lab test that is abnormal or we need to change your treatment, we will call you to review the results.   Testing/Procedures: Not needed  Follow-Up: At [facility], you and your health needs are our priority.  As part  of our continuing mission to provide you with exceptional heart care, we have created designated Provider Care Teams.  These Care Teams include your primary Cardiologist (physician) and Advanced Practice Providers (APPs -  Physician Assistants and Nurse Practitioners) who all work together to provide you with the care you need, when you need it.  We recommend signing up for the patient portal called "MyChart".  Sign up information is provided on this After Visit Summary.  MyChart is used to connect with patients for Virtual Visits (Telemedicine).  Patients are able to view lab/test results, encounter notes, upcoming appointments, etc.  Non-urgent messages can be sent to your provider as well.   To learn more about what you can do with MyChart, go to [email or web address].    Your next appointment:   6 month(s)  The format for your next appointment:   In Person  Provider:   Glenetta Hew, MD   Other Instructions Contact your primary  Doctor Dr Ayesha Rumpf [phone])  - reestablish for following your diabetes  PATIENT WOULD LIKE TO CHANGE PRIMARY CARE - REFERRAL  TO [facility]    Studies Ordered:   Orders Placed This Encounter  Procedures  . Lipid panel  . Comprehensive metabolic panel  . Hemoglobin A1c  . Lipid panel  . Comprehensive metabolic panel  . Hemoglobin A1c  . EKG 12-Lead     Glenetta Hew, M.D., M.S. Interventional Cardiologist   Pager # [phone] Phone # [phone] [address] [place], [place] [address]   Thank you for choosing Heartcare at [facility]!!

## 2019-08-10 LAB — COMPREHENSIVE METABOLIC PANEL
ALT: 23 IU/L (ref 0–44)
AST: 18 IU/L (ref 0–40)
Albumin/Globulin Ratio: 1.5 (ref 1.2–2.2)
Albumin: 4.7 g/dL (ref 4.0–5.0)
Alkaline Phosphatase: 132 IU/L — ABNORMAL HIGH (ref 39–117)
BUN/Creatinine Ratio: 11 (ref 9–20)
BUN: 12 mg/dL (ref 6–24)
Bilirubin Total: 0.5 mg/dL (ref 0.0–1.2)
CO2: 20 mmol/L (ref 20–29)
Calcium: 10.1 mg/dL (ref 8.7–10.2)
Chloride: 96 mmol/L (ref 96–106)
Creatinine, Ser: 1.07 mg/dL (ref 0.76–1.27)
GFR calc Af Amer: 94 mL/min/{1.73_m2} (ref >59)
GFR calc non Af Amer: 82 mL/min/{1.73_m2} (ref >59)
Globulin, Total: 3.1 g/dL (ref 1.5–4.5)
Glucose: 398 mg/dL — ABNORMAL HIGH (ref 65–99)
Potassium: 4.5 mmol/L (ref 3.5–5.2)
Sodium: 135 mmol/L (ref 134–144)
Total Protein: 7.8 g/dL (ref 6.0–8.5)

## 2019-08-10 LAB — LIPID PANEL
Chol/HDL Ratio: 8.7 ratio — ABNORMAL HIGH (ref 0.0–5.0)
Cholesterol, Total: 227 mg/dL — ABNORMAL HIGH (ref 100–199)
HDL: 26 mg/dL — ABNORMAL LOW (ref >39)
LDL Chol Calc (NIH): 87 mg/dL (ref 0–99)
Triglycerides: 696 mg/dL (ref 0–149)
VLDL Cholesterol Cal: 114 mg/dL — ABNORMAL HIGH (ref 5–40)

## 2019-08-10 LAB — HEMOGLOBIN A1C
Est. average glucose Bld gHb Est-mCnc: 367 mg/dL
Hgb A1c MFr Bld: 14.4 % — ABNORMAL HIGH (ref 4.8–5.6)

## 2019-08-12 ENCOUNTER — Encounter: Payer: Self-pay | Admitting: Cardiology

## 2019-08-12 NOTE — Assessment & Plan Note (Signed)
We will check an A1c to see where he stands, it sounds he is having lots of urinary frequency and lightheadedness associated with high blood sugars.  He has not had his labs checked in a while.  Needs to get back in touch with his PCP to reevaluate treatment options.  I do not even know what he is taking, but it sounds like he is taking a long-acting insulin such as Levemir.

## 2019-08-12 NOTE — Assessment & Plan Note (Signed)
Large caliber stent in the RCA.  We are well beyond the window of stent thrombosis.  No longer on dual antiplatelets.  Is on aspirin alone.

## 2019-08-12 NOTE — Assessment & Plan Note (Signed)
He never did get his labs checked back in 2018.  We will check lipid panel chemistry panel and A1c now and then again in 3 to 4 months just to reassess.  Restarting atorvastatin 40 mg.

## 2019-08-12 NOTE — Assessment & Plan Note (Signed)
Distant history of MI or a young age.  He had relatively low troponin elevation and no significant change in wall motion on echocardiogram. Unfortunately, the lack of severe symptoms and residual effect, has made him somewhat carefree his self-management.  No longer on medications besides aspirin and some type of insulin.  Thankfully, no recurrent angina or heart failure symptoms.

## 2019-08-12 NOTE — Assessment & Plan Note (Addendum)
Counseling provided.  He did indicate that it was hard to consider doing both smoking and alcohol cessation.  Once he is able to solidify his alcohol cessation, he wants to start working on smoking cessation.  Not ready at this point.

## 2019-08-12 NOTE — Assessment & Plan Note (Signed)
Listed as a diagnosis, but currently his blood pressure is relatively low not on any medications.  He does have a heart rate of 92 and has diabetes.  Would like to have him on a low-dose beta-blocker and ACE inhibitor, but would need to make sure that he is having routine follow-up.

## 2019-08-12 NOTE — Assessment & Plan Note (Signed)
The patient understands the need to lose weight with diet and exercise. We have discussed specific strategies for this.  

## 2019-08-12 NOTE — Assessment & Plan Note (Addendum)
Just about 8 years out from his inferior STEMI.  Thankfully, despite not being on any real medication regimen, he is doing relatively well with no further symptoms.  Plan:  Restart atorvastatin 40 mg.  With normal blood pressure and him having some fatigue I am leery of restarting beta-blocker at this point.  Continue aspirin 81 mg daily.  Given his current blood pressure no room for ARB or ACE inhibitor despite him being a diabetic.  Refill as needed nitroglycerin.

## 2019-12-11 ENCOUNTER — Other Ambulatory Visit: Payer: Self-pay

## 2019-12-11 DIAGNOSIS — E118 Type 2 diabetes mellitus with unspecified complications: Secondary | ICD-10-CM

## 2019-12-11 DIAGNOSIS — Z794 Long term (current) use of insulin: Secondary | ICD-10-CM

## 2019-12-11 MED ORDER — ICOSAPENT ETHYL 1 G PO CAPS: 2.0000 g | ORAL_CAPSULE | Freq: Two times a day (BID) | ORAL | 3 refills | Status: AC

## 2019-12-27 ENCOUNTER — Other Ambulatory Visit: Payer: Self-pay

## 2019-12-27 MED ORDER — ATORVASTATIN CALCIUM 40 MG PO TABS: 40.0000 mg | ORAL_TABLET | Freq: Every day | ORAL | 3 refills | Status: DC

## 2020-01-01 ENCOUNTER — Telehealth: Payer: Self-pay | Admitting: *Deleted

## 2020-01-01 NOTE — Telephone Encounter (Signed)
A message was left, re: his follow up visit with Dr.Harding.

## 2020-01-10 ENCOUNTER — Telehealth: Payer: Self-pay | Admitting: Cardiology

## 2020-01-10 NOTE — Telephone Encounter (Signed)
LVM for patient to return call to get follow up scheduled with [facility] from recall list 

## 2020-02-22 ENCOUNTER — Ambulatory Visit (INDEPENDENT_AMBULATORY_CARE_PROVIDER_SITE_OTHER): Payer: 59 | Admitting: Internal Medicine

## 2020-02-22 ENCOUNTER — Encounter: Payer: Self-pay | Admitting: Internal Medicine

## 2020-02-22 ENCOUNTER — Other Ambulatory Visit: Payer: Self-pay

## 2020-02-22 VITALS — BP 138/74 | HR 72 | Ht 74.0 in | Wt 300.4 lb

## 2020-02-22 DIAGNOSIS — E1165 Type 2 diabetes mellitus with hyperglycemia: Secondary | ICD-10-CM | POA: Diagnosis not present

## 2020-02-22 DIAGNOSIS — Z794 Long term (current) use of insulin: Secondary | ICD-10-CM

## 2020-02-22 DIAGNOSIS — E1142 Type 2 diabetes mellitus with diabetic polyneuropathy: Secondary | ICD-10-CM

## 2020-02-22 DIAGNOSIS — E119 Type 2 diabetes mellitus without complications: Secondary | ICD-10-CM | POA: Insufficient documentation

## 2020-02-22 DIAGNOSIS — E1159 Type 2 diabetes mellitus with other circulatory complications: Secondary | ICD-10-CM

## 2020-02-22 DIAGNOSIS — E785 Hyperlipidemia, unspecified: Secondary | ICD-10-CM | POA: Diagnosis not present

## 2020-02-22 LAB — POCT GLYCOSYLATED HEMOGLOBIN (HGB A1C): Hemoglobin A1C: 8.2 % — AB (ref 4.0–5.6)

## 2020-02-22 LAB — MICROALBUMIN / CREATININE URINE RATIO
Creatinine,U: 205.4 mg/dL
Microalb Creat Ratio: 0.9 mg/g (ref 0.0–30.0)
Microalb, Ur: 1.9 mg/dL (ref 0.0–1.9)

## 2020-02-22 LAB — POCT GLUCOSE (DEVICE FOR HOME USE): Glucose Fasting, POC: 218 mg/dL — AB (ref 70–99)

## 2020-02-22 MED ORDER — FREESTYLE LIBRE 2 READER DEVI: 1.0000 | 0 refills | Status: AC

## 2020-02-22 MED ORDER — FREESTYLE LIBRE 2 SENSOR MISC: 1.0000 | 11 refills | Status: AC

## 2020-02-22 NOTE — Patient Instructions (Addendum)
-   Continue Farxiga 10 mg , 1 tablet daily  - Decrease Lantus 70 units daily  - Continue Humalog  14 units with each meal   - Choose healthy, lower carb lower calorie snacks: toss salad, cooked vegetables, cottage cheese, peanut butter, low fat cheese / string cheese, lower sodium deli meat, tuna salad or chicken salad     HOW TO TREAT LOW BLOOD SUGARS (Blood sugar LESS THAN 70 MG/DL)  Please follow the RULE OF 15 for the treatment of hypoglycemia treatment (when your (blood sugars are less than 70 mg/dL)    STEP 1: Take 15 grams of carbohydrates when your blood sugar is low, which includes:   3-4 GLUCOSE TABS  OR  3-4 OZ OF JUICE OR REGULAR SODA OR  ONE TUBE OF GLUCOSE GEL     STEP 2: RECHECK blood sugar in 15 MINUTES STEP 3: If your blood sugar is still low at the 15 minute recheck --> then, go back to STEP 1 and treat AGAIN with another 15 grams of carbohydrates.

## 2020-02-22 NOTE — Progress Notes (Signed)
Name: George Dickson  MRN/ DOB: [id number], 09-Sep-1970   Age/ Sex: 49 y.o., male    PCP: Lin Landsman, MD   Reason for Endocrinology Evaluation: Type 2 Diabetes Mellitus     Date of Initial Endocrinology Visit: 02/22/2020     PATIENT IDENTIFIER: Mr. George Dickson is a 49 y.o. male with a past medical history of T2Dm, dyslipidemia and CAD. The patient presented for initial endocrinology clinic visit on 02/22/2020 for consultative assistance with his diabetes management.    HPI: Mr. George Dickson was    Diagnosed with DM in 2013 Prior Medications tried/Intolerance: Has been on insulin for years, farxiga started in 2020. Intolerant to Metformin- diarrhea  Currently checking blood sugars 1 x / day  Hypoglycemia episodes : no             Hemoglobin A1c has ranged from 5.9% in 2018, peaking at 14.4% in 2021. Patient required assistance for hypoglycemia: no Patient has required hospitalization within the last 1 year from hyper or hypoglycemia: no  In terms of diet, the patient eats 1-2 meals a day, drinks sweet tea and eats snack through the day    HOME DIABETES REGIMEN: Farxiga 10 mg daily  Lantus 80 units daily  Humalog  14 units - when he remmbers  Statin: yes ACE-I/ARB: yes    METER DOWNLOAD SUMMARY: Did not bring    DIABETIC COMPLICATIONS: Microvascular complications:   Neuropathy   Denies: CKD,   Last eye exam: Completed yrs   Macrovascular complications:   CAD ( S/P stents)  Denies:  PVD, CVA   PAST HISTORY: Past Medical History:  Past Medical History:  Diagnosis Date  . Anxiety   . CAD S/P percutaneous coronary angioplasty 10/2011   Insetting of inferior STEMI. Proximal RCA PCI with Promus Element DES - 3.0 mm x 24 mm (3.35-3.2 tapered)  . Dyslipidemia   . GERD (gastroesophageal reflux disease)    occasionally takes TUMS  . Headache(784.0)   . Hypertension   . Kidney stones   . OSA (obstructive sleep apnea) 12/06/2011   initial sleep study  completed, CPAP  /BiPAP TITRATION DONE 12/20/11  . Pneumonia   . ST elevation myocardial infarction (STEMI) of inferior wall ([place]) 10/23/2011   Proximal RCA occlusion treated with DES stent  . Type II diabetes mellitus ([place])    "dx'd today" (08/13/2013)   Past Surgical History:  Past Surgical History:  Procedure Laterality Date  . LEFT HEART CATHETERIZATION WITH CORONARY ANGIOGRAM N/A 10/23/2011   Procedure: LEFT HEART CATHETERIZATION WITH CORONARY ANGIOGRAM;  Surgeon: [name], MD;  Location: [facility] CATH LAB;  Service: Cardiovascular;  Laterality: N/A;  . LUMBAR LAMINECTOMY/DECOMPRESSION MICRODISCECTOMY Right 08/17/2013   Procedure: LUMBAR FOUR TO FIVE LUMBAR LAMINECTOMY/DECOMPRESSION MICRODISCECTOMY 1 LEVEL;  Surgeon: Charlie Pitter, MD;  Location: [facility];  Service: Neurosurgery;  Laterality: Right;  . PERCUTANEOUS CORONARY STENT INTERVENTION (PCI-S)  10/23/2011   Procedure: PERCUTANEOUS CORONARY STENT INTERVENTION (PCI-S) - With Aspiration Thrombectomy;  Surgeon: [name], MD;  Location: [facility] CATH LAB;  Service: Cardiovascular;; PCI - proximal  RCA with  Promus ELEMENT  DES : 3.0 mm x 42mm ; taper post-dilation: 3.35-3.2 mm  . SHOULDER ARTHROSCOPY Right 1992  . TRANSTHORACIC ECHOCARDIOGRAM  10/25/2011   EF 43-32%;RJ diastolic function normal ,systolic function normal      Social History:  reports that he has been smoking cigars and cigarettes. He has a 4.00 pack-year smoking history. He has never used smokeless tobacco. He reports  current alcohol use. He reports that he does not use drugs. Family History:  Family History  Problem Relation Age of Onset  . Coronary artery disease Mother   . Hypertension Mother   . Diabetes type II Mother      HOME MEDICATIONS: Allergies as of 02/22/2020   No Known Allergies     Medication List       Accurate as of February 22, 2020  9:23 AM. If you have any questions, ask your nurse or doctor.        aspirin EC 81 MG tablet Take 1 tablet (81 mg total) by  mouth daily.   atorvastatin 40 MG tablet Commonly known as: LIPITOR Take 1 tablet (40 mg total) by mouth daily.   Farxiga 10 MG Tabs tablet Generic drug: dapagliflozin propanediol Take 10 mg by mouth daily.   HumaLOG KwikPen 100 UNIT/ML KwikPen Generic drug: insulin lispro Inject into the skin.   icosapent Ethyl 1 g capsule Commonly known as: Vascepa Take 2 capsules (2 g total) by mouth 2 (two) times daily.   Lantus SoloStar 100 UNIT/ML Solostar Pen Generic drug: insulin glargine SMARTSIG:80 Unit(s) SUB-Q Every Evening   lisinopril 20 MG tablet Commonly known as: ZESTRIL Take 20 mg by mouth daily.   nitroGLYCERIN 0.4 MG SL tablet Commonly known as: NITROSTAT Place 1 tablet (0.4 mg total) under the tongue every 5 (five) minutes as needed for chest pain.   OneTouch Verio test strip Generic drug: glucose blood daily.        ALLERGIES: No Known Allergies   REVIEW OF SYSTEMS: A comprehensive ROS was conducted with the patient and is negative except as per HPI and below:  Review of Systems  Gastrointestinal: Negative for diarrhea and nausea.  Neurological: Positive for tingling.      OBJECTIVE:   VITAL SIGNS: BP 138/74 (BP Location: Left Arm, Patient Position: Sitting, Cuff Size: Normal)   Pulse 72   Ht 6\' 2"  (1.88 m)   Wt (!) 300 lb 6.4 oz (136.3 kg)   SpO2 97%   BMI 38.57 kg/m    PHYSICAL EXAM:  General: Pt appears well and is in NAD  Neck: General: Supple without adenopathy or carotid bruits. Thyroid: Thyroid size normal.  No goiter or nodules appreciated. No thyroid bruit.  Lungs: Clear with good BS bilat with no rales, rhonchi, or wheezes  Heart: RRR with normal S1 and S2 and no gallops; no murmurs; no rub  Abdomen: Normoactive bowel sounds, soft, nontender, without masses or organomegaly palpable  Extremities:  Lower extremities - No pretibial edema. No lesions.  Skin: Normal texture and temperature to palpation. No rash noted. No Acanthosis  nigricans/skin tags. No lipohypertrophy.  Neuro: MS is good with appropriate affect, pt is alert and Ox3    DATA REVIEWED:  Lab Results  Component Value Date   HGBA1C 14.4 (H) 08/09/2019   HGBA1C 11.2 (H) 09/20/2018   HGBA1C 5.9 (H) 02/03/2017   Lab Results  Component Value Date   LDLCALC 87 08/09/2019   CREATININE 1.07 08/09/2019    Lab Results  Component Value Date   CHOL 227 (H) 08/09/2019   HDL 26 (L) 08/09/2019   LDLCALC 87 08/09/2019   TRIG 696 (HH) 08/09/2019   CHOLHDL 8.7 (H) 08/09/2019       In -office Bg 218 mg/dL  ASSESSMENT / PLAN / RECOMMENDATIONS:   1) Type 2 Diabetes Mellitus, Improving glycemic control , With Neuropathic  complications - Most recent A1c of 8.2 %.  Goal A1c < 7.0 %.    Plan: GENERAL: I have discussed with the patient the pathophysiology of diabetes. We went over the natural progression of the disease. We talked about both insulin resistance and insulin deficiency. We stressed the importance of lifestyle changes including diet and exercise. I explained the complications associated with diabetes including retinopathy, nephropathy, neuropathy as well as increased risk of cardiovascular disease. We went over the benefit seen with glycemic control.    I explained to the patient that diabetic patients are at higher than normal risk for amputations.  Intolerant to Metformin due to severe diarrhea  Not a candidate for GLP-1 agonists due to increased risk of pancreatitis with high Tg  Discussed pharmacokinetics of basal/bolus insulin and the importance of taking prandial insulin with meals.   We also discussed avoiding sugar-sweetened beverages and snacks, when possible.     MEDICATIONS: - Continue Farxiga 10 mg , 1 tablet daily  - Decrease Lantus 70 units daily  - Continue Humalog  14 units with each meal   EDUCATION / INSTRUCTIONS:  BG monitoring instructions: Patient is instructed to check his blood sugars 3 times a day,before meals  .  Call [facility] if: BG persistently < 70  . I reviewed the Rule of 15 for the treatment of hypoglycemia in detail with the patient. Literature supplied.   2) Diabetic complications:   Eye: Does not have known diabetic retinopathy. Pt urged to have an eye exam   Neuro/ Feet: Does not have known diabetic peripheral neuropathy.  Renal: Patient does not have known baseline CKD. He is  on an ACEI/ARB at present.Up to date on Ma/Cr ratio    3) Dyslipidemia: Patient is on atorvastatin 40 mg and Vascepa 1 g, 2 caps BID. Discussed cardiovascular benefits of statins.  We discussed high Tg level and his risk of pancreatitis.      F/U in 3 months    Signed electronically by: Mack Guise, MD  [facility]  [facility] [address] [place], [place] [address] Phone: [phone] FAX: [phone]   CC: Lin Landsman, [place] [address] Phone: [phone]  Fax: [phone]    Return to Endocrinology clinic as below: Future Appointments  Date Time Provider [place]  04/11/2020  8:40 AM [name], MD [facility]

## 2020-03-03 ENCOUNTER — Ambulatory Visit
Admission: EM | Admit: 2020-03-03 | Discharge: 2020-03-03 | Disposition: A | Discharge status: Home / Self Care | Payer: 59 | Attending: Emergency Medicine | Admitting: Emergency Medicine

## 2020-03-03 DIAGNOSIS — Z20822 Contact with and (suspected) exposure to covid-19: Secondary | ICD-10-CM | POA: Diagnosis not present

## 2020-03-03 DIAGNOSIS — J069 Acute upper respiratory infection, unspecified: Secondary | ICD-10-CM

## 2020-03-03 MED ORDER — PROMETHAZINE-DM 6.25-15 MG/5ML PO SYRP: 5.0000 mL | ORAL_SOLUTION | Freq: Every evening | ORAL | 0 refills | Status: DC | PRN

## 2020-03-03 MED ORDER — DM-GUAIFENESIN ER 30-600 MG PO TB12: 1.0000 | ORAL_TABLET | Freq: Two times a day (BID) | ORAL | 0 refills | Status: DC

## 2020-03-03 MED ORDER — ALBUTEROL SULFATE HFA 108 (90 BASE) MCG/ACT IN AERS: 1.0000 | INHALATION_SPRAY | Freq: Four times a day (QID) | RESPIRATORY_TRACT | 0 refills | Status: AC | PRN

## 2020-03-03 MED ORDER — DEXAMETHASONE SODIUM PHOSPHATE 10 MG/ML IJ SOLN
10.0000 mg | Freq: Once | INTRAMUSCULAR | Status: AC
  Administered 2020-03-03: 10 mg via INTRAMUSCULAR

## 2020-03-03 MED ORDER — DOXYCYCLINE HYCLATE 100 MG PO CAPS: 100.0000 mg | ORAL_CAPSULE | Freq: Two times a day (BID) | ORAL | 0 refills | Status: DC

## 2020-03-03 MED ORDER — BENZONATATE 200 MG PO CAPS: 200.0000 mg | ORAL_CAPSULE | Freq: Three times a day (TID) | ORAL | 0 refills | Status: AC | PRN

## 2020-03-03 MED ORDER — FLUTICASONE PROPIONATE 50 MCG/ACT NA SUSP: 1.0000 | Freq: Every day | NASAL | 0 refills | Status: DC

## 2020-03-03 NOTE — ED Triage Notes (Signed)
Pt c/o productive cough with green sputum, sore throat, and sinus pressure since x5 days

## 2020-03-03 NOTE — Discharge Instructions (Signed)
Covid test pending, monitor my chart for results Begin doxycycline twice daily for 1 week We gave you 1 dose of Decadron today to help with inflammation in sinuses and chest Continue albuterol inhaler as needed for shortness of breath and wheezing Mucinex DM twice daily for congestion and cough Tessalon for daytime cough Phenergan DM for nighttime cough Flonase for nasal congestion Rest and fluids Follow-up if not improving or worsening

## 2020-03-03 NOTE — ED Provider Notes (Signed)
[facility]    CSN: [id number] Arrival date & time: 03/03/20  1353      History   Chief Complaint Chief Complaint  Patient presents with  . Cough    HPI George Dickson is a 49 y.o. male history of tobacco use, CAD, hypertension, DM type II, presenting today for evaluation of cough and congestion.  Patient reports over the past 5 days he has had cough, congestion drainage and sore throat.  Has been using Robitussin NyQuil cough drops and Vicks without relief of symptoms.  He also reports associated shortness of breath and wheezing.  Reports history of bronchitis from smoking.  Reports blood sugars of recently running around 150-200.   HPI  Past Medical History:  Diagnosis Date  . Anxiety   . CAD S/P percutaneous coronary angioplasty 10/2011   Insetting of inferior STEMI. Proximal RCA PCI with Promus Element DES - 3.0 mm x 24 mm (3.35-3.2 tapered)  . Dyslipidemia   . GERD (gastroesophageal reflux disease)    occasionally takes TUMS  . Headache(784.0)   . Hypertension   . Kidney stones   . OSA (obstructive sleep apnea) 12/06/2011   initial sleep study  completed, CPAP /BiPAP TITRATION DONE 12/20/11  . Pneumonia   . ST elevation myocardial infarction (STEMI) of inferior wall ([place]) 10/23/2011   Proximal RCA occlusion treated with DES stent  . Type II diabetes mellitus ([place])    "dx'd today" (08/13/2013)    Patient Active Problem List   Diagnosis Date Noted  . Type 2 diabetes mellitus with hyperglycemia, with long-term current use of insulin ([place]) 02/22/2020  . Type 2 diabetes mellitus with diabetic polyneuropathy, with long-term current use of insulin ([place]) 02/22/2020  . Diabetes mellitus ([place]) 02/22/2020  . Dyslipidemia 02/22/2020  . Hyperlipidemia with target low density lipoprotein (LDL) cholesterol less than 70 mg/dL 02/03/2017  . Unstable angina ([place]) 01/07/2017  . Nephrolithiasis 01/07/2017  . HNP (herniated nucleus pulposus), lumbar 08/17/2013  . Lumbar  disc herniation with radiculopathy 08/17/2013  . Type 2 diabetes mellitus with complication, with long-term current use of insulin ([place]) 08/15/2013  . Essential hypertension 08/13/2013  . Presence of drug coated stent in right coronary artery - Promus 3.0 mm x 24 mm - post dilated to 3.3 mm  10/24/2011    Class: Acute  . Tobacco use 10/23/2011  . ST elevation myocardial infarction (STEMI) involving right coronary artery without development of Q waves ([place]) 10/23/2011  . Obesity, Class II, BMI 35-39.9 10/23/2011  . Coronary artery disease involving native coronary artery of native heart with angina pectoris ([place]) 10/23/2011    Past Surgical History:  Procedure Laterality Date  . LEFT HEART CATHETERIZATION WITH CORONARY ANGIOGRAM N/A 10/23/2011   Procedure: LEFT HEART CATHETERIZATION WITH CORONARY ANGIOGRAM;  Surgeon: [name], MD;  Location: [facility] CATH LAB;  Service: Cardiovascular;  Laterality: N/A;  . LUMBAR LAMINECTOMY/DECOMPRESSION MICRODISCECTOMY Right 08/17/2013   Procedure: LUMBAR FOUR TO FIVE LUMBAR LAMINECTOMY/DECOMPRESSION MICRODISCECTOMY 1 LEVEL;  Surgeon: Charlie Pitter, MD;  Location: [facility];  Service: Neurosurgery;  Laterality: Right;  . PERCUTANEOUS CORONARY STENT INTERVENTION (PCI-S)  10/23/2011   Procedure: PERCUTANEOUS CORONARY STENT INTERVENTION (PCI-S) - With Aspiration Thrombectomy;  Surgeon: [name], MD;  Location: [facility] CATH LAB;  Service: Cardiovascular;; PCI - proximal  RCA with  Promus ELEMENT  DES : 3.0 mm x 32mm ; taper post-dilation: 3.35-3.2 mm  . SHOULDER ARTHROSCOPY Right 1992  . TRANSTHORACIC ECHOCARDIOGRAM  10/25/2011   EF 60-65%;LV  diastolic function normal ,systolic function normal       Home Medications    Prior to Admission medications   Medication Sig Start Date End Date Taking? Authorizing Provider  albuterol (VENTOLIN HFA) 108 (90 Base) MCG/ACT inhaler Inhale 1-2 puffs into the lungs every 6 (six) hours as needed for wheezing or shortness  of breath. 03/03/20   Saroya Riccobono C, PA-C  aspirin EC 81 MG tablet Take 1 tablet (81 mg total) by mouth daily. 02/03/17   [name], MD  atorvastatin (LIPITOR) 40 MG tablet Take 1 tablet (40 mg total) by mouth daily. 12/27/19 03/26/20  [name], MD  benzonatate (TESSALON) 200 MG capsule Take 1 capsule (200 mg total) by mouth 3 (three) times daily as needed for up to 7 days for cough. 03/03/20 03/10/20  Belina Mandile C, PA-C  Continuous Blood Gluc Receiver (FREESTYLE LIBRE 2 READER) DEVI 1 Device by Does not apply route as directed. 02/22/20   Shamleffer, Melanie Crazier, MD  Continuous Blood Gluc Sensor (FREESTYLE LIBRE 2 SENSOR) MISC 1 Device by Does not apply route as directed. 02/22/20   Shamleffer, Melanie Crazier, MD  dextromethorphan-guaiFENesin (MUCINEX DM) 30-600 MG 12hr tablet Take 1 tablet by mouth 2 (two) times daily. 03/03/20   Jai Bear C, PA-C  doxycycline (VIBRAMYCIN) 100 MG capsule Take 1 capsule (100 mg total) by mouth 2 (two) times daily. 03/03/20   Zoraya Fiorenza C, PA-C  FARXIGA 10 MG TABS tablet Take 10 mg by mouth daily. 09/08/19   [provider]  fluticasone (FLONASE) 50 MCG/ACT nasal spray Place 1-2 sprays into both nostrils daily. 03/03/20   Heike Pounds C, PA-C  HUMALOG KWIKPEN 100 UNIT/ML KwikPen Inject into the skin. 10/16/19   [provider]  icosapent Ethyl (VASCEPA) 1 g capsule Take 2 capsules (2 g total) by mouth 2 (two) times daily. 12/11/19   [name], MD  LANTUS SOLOSTAR 100 UNIT/ML Solostar Pen SMARTSIG:80 Unit(s) SUB-Q Every Evening 01/30/20   [provider]  lisinopril (ZESTRIL) 20 MG tablet Take 20 mg by mouth daily. 12/28/19   [provider]  nitroGLYCERIN (NITROSTAT) 0.4 MG SL tablet Place 1 tablet (0.4 mg total) under the tongue every 5 (five) minutes as needed for chest pain. 08/09/19 11/07/19  [name], MD  [facility] VERIO test strip daily. 10/16/19   [provider]    promethazine-dextromethorphan (PROMETHAZINE-DM) 6.25-15 MG/5ML syrup Take 5 mLs by mouth at bedtime as needed for cough. 03/03/20   Kallum Jorgensen, Elesa Hacker, PA-C    Family History Family History  Problem Relation Age of Onset  . Coronary artery disease Mother   . Hypertension Mother   . Diabetes type II Mother     Social History Social History   Tobacco Use  . Smoking status: Current Every Day Smoker    Packs/day: 1.00    Years: 4.00    Pack years: 4.00    Types: Cigars, Cigarettes  . Smokeless tobacco: Never Used  . Tobacco comment: trying to quit - has not had any cigarettes or cigars for 3 weeks  Substance Use Topics  . Alcohol use: Yes  . Drug use: No    Comment: in the past     Allergies   Patient has no known allergies.   Review of Systems Review of Systems  Constitutional: Positive for fatigue. Negative for activity change, appetite change, chills and fever.  HENT: Positive for congestion, rhinorrhea, sinus pressure and sore throat. Negative for ear pain  and trouble swallowing.   Eyes: Negative for discharge and redness.  Respiratory: Positive for cough. Negative for chest tightness and shortness of breath.   Cardiovascular: Negative for chest pain.  Gastrointestinal: Negative for abdominal pain, diarrhea, nausea and vomiting.  Musculoskeletal: Negative for myalgias.  Skin: Negative for rash.  Neurological: Negative for dizziness, light-headedness and headaches.     Physical Exam Triage Vital Signs ED Triage Vitals [03/03/20 1638]  Enc Vitals Group     BP (!) 142/93     Pulse Rate 70     Resp 18     Temp 97.9 F (36.6 C)     Temp Source Oral     SpO2 96 %     Weight      Height      Head Circumference      Peak Flow      Pain Score 0     Pain Loc      Pain Edu?      Excl. in [place]?    No data found.  Updated Vital Signs BP (!) 142/93 (BP Location: Left Arm)   Pulse 70   Temp 97.9 F (36.6 C) (Oral)   Resp 18   SpO2 96%   Visual  Acuity Right Eye Distance:   Left Eye Distance:   Bilateral Distance:    Right Eye Near:   Left Eye Near:    Bilateral Near:     Physical Exam Vitals and nursing note reviewed.  Constitutional:      Appearance: He is well-developed.     Comments: No acute distress  HENT:     Head: Normocephalic and atraumatic.     Ears:     Comments: Bilateral ears without tenderness to palpation of external auricle, tragus and mastoid, EAC's without erythema or swelling, TM's with good bony landmarks and cone of light. Non erythematous.     Nose: Nose normal.     Mouth/Throat:     Comments: Oral mucosa pink and moist, no tonsillar enlargement or exudate. Posterior pharynx patent and nonerythematous, no uvula deviation or swelling. Normal phonation. Eyes:     Conjunctiva/sclera: Conjunctivae normal.  Cardiovascular:     Rate and Rhythm: Normal rate.  Pulmonary:     Effort: Pulmonary effort is normal. No respiratory distress.     Comments: Breathing comfortably at rest, CTABL, no wheezing, rales or other adventitious sounds auscultated Abdominal:     General: There is no distension.  Musculoskeletal:        General: Normal range of motion.     Cervical back: Neck supple.  Skin:    General: Skin is warm and dry.  Neurological:     Mental Status: He is alert and oriented to person, place, and time.      UC Treatments / Results  Labs (all labs ordered are listed, but only abnormal results are displayed) Labs Reviewed  NOVEL CORONAVIRUS, NAA    EKG   Radiology No results found.  Procedures Procedures (including critical care time)  Medications Ordered in UC Medications  dexamethasone (DECADRON) injection 10 mg (10 mg Intramuscular Given 03/03/20 1717)    Initial Impression / Assessment and Plan / UC Course  I have reviewed the triage vital signs and the nursing notes.  Pertinent labs & imaging results that were available during my care of the patient were reviewed by me  and considered in my medical decision making (see chart for details).     Covid test pending, treating for  bronchitis, likely has underlying COPD, initially on doxycycline, one-time dose of Decadron today, deferring oral steroids given in setting of diabetes, albuterol inhaler as needed, Mucinex DM Tessalon and Phenergan DM for cough and congestion.  Flonase for nasal congestion.  Rest and fluids.  Continue to monitor breathing.  Discussed strict return precautions. Patient verbalized understanding and is agreeable with plan.  Final Clinical Impressions(s) / UC Diagnoses   Final diagnoses:  Encounter for screening laboratory testing for COVID-19 virus  Viral URI with cough     Discharge Instructions     Covid test pending, monitor my chart for results Begin doxycycline twice daily for 1 week We gave you 1 dose of Decadron today to help with inflammation in sinuses and chest Continue albuterol inhaler as needed for shortness of breath and wheezing Mucinex DM twice daily for congestion and cough Tessalon for daytime cough Phenergan DM for nighttime cough Flonase for nasal congestion Rest and fluids Follow-up if not improving or worsening    ED Prescriptions    Medication Sig Dispense Auth. Provider   albuterol (VENTOLIN HFA) 108 (90 Base) MCG/ACT inhaler Inhale 1-2 puffs into the lungs every 6 (six) hours as needed for wheezing or shortness of breath. 18 g Javani Spratt C, PA-C   dextromethorphan-guaiFENesin (MUCINEX DM) 30-600 MG 12hr tablet Take 1 tablet by mouth 2 (two) times daily. 20 tablet Eder Macek C, PA-C   benzonatate (TESSALON) 200 MG capsule Take 1 capsule (200 mg total) by mouth 3 (three) times daily as needed for up to 7 days for cough. 28 capsule Milano Rosevear C, PA-C   promethazine-dextromethorphan (PROMETHAZINE-DM) 6.25-15 MG/5ML syrup Take 5 mLs by mouth at bedtime as needed for cough. 118 mL Tahsin Benyo C, PA-C   doxycycline (VIBRAMYCIN) 100 MG  capsule Take 1 capsule (100 mg total) by mouth 2 (two) times daily. 20 capsule Kristina Bertone C, PA-C   fluticasone (FLONASE) 50 MCG/ACT nasal spray Place 1-2 sprays into both nostrils daily. 16 g Kaedin Hicklin, [place] C, PA-C     PDMP not reviewed this encounter.   Janith Lima, [place] 03/03/20 1738

## 2020-03-04 LAB — NOVEL CORONAVIRUS, NAA: SARS-CoV-2, NAA: NOT DETECTED

## 2020-03-04 LAB — SARS-COV-2, NAA 2 DAY TAT

## 2020-04-09 LAB — COMPREHENSIVE METABOLIC PANEL: BUN: 9 mg/dL (ref 6–24)

## 2020-04-09 LAB — LIPID PANEL: Cholesterol, Total: 168 mg/dL (ref 100–199)

## 2020-04-10 LAB — COMPREHENSIVE METABOLIC PANEL
ALT: 26 IU/L (ref 0–44)
AST: 19 IU/L (ref 0–40)
Albumin/Globulin Ratio: 1.8 (ref 1.2–2.2)
Albumin: 4.2 g/dL (ref 4.0–5.0)
Alkaline Phosphatase: 99 IU/L (ref 44–121)
BUN/Creatinine Ratio: 11 (ref 9–20)
Bilirubin Total: 0.2 mg/dL (ref 0.0–1.2)
CO2: 23 mmol/L (ref 20–29)
Calcium: 9.2 mg/dL (ref 8.7–10.2)
Chloride: 104 mmol/L (ref 96–106)
Creatinine, Ser: 0.83 mg/dL (ref 0.76–1.27)
GFR calc Af Amer: 119 mL/min/{1.73_m2} (ref >59)
GFR calc non Af Amer: 103 mL/min/{1.73_m2} (ref >59)
Globulin, Total: 2.4 g/dL (ref 1.5–4.5)
Glucose: 168 mg/dL — ABNORMAL HIGH (ref 65–99)
Potassium: 4.8 mmol/L (ref 3.5–5.2)
Sodium: 138 mmol/L (ref 134–144)
Total Protein: 6.6 g/dL (ref 6.0–8.5)

## 2020-04-10 LAB — LIPID PANEL
Chol/HDL Ratio: 5.6 ratio — ABNORMAL HIGH (ref 0.0–5.0)
HDL: 30 mg/dL — ABNORMAL LOW (ref >39)
LDL Chol Calc (NIH): 98 mg/dL (ref 0–99)
Triglycerides: 233 mg/dL — ABNORMAL HIGH (ref 0–149)
VLDL Cholesterol Cal: 40 mg/dL (ref 5–40)

## 2020-04-10 LAB — HEMOGLOBIN A1C
Est. average glucose Bld gHb Est-mCnc: 240 mg/dL
Hgb A1c MFr Bld: 10 % — ABNORMAL HIGH (ref 4.8–5.6)

## 2020-04-11 ENCOUNTER — Ambulatory Visit: Payer: 59 | Admitting: Cardiology

## 2020-04-11 ENCOUNTER — Other Ambulatory Visit: Payer: Self-pay

## 2020-04-11 ENCOUNTER — Encounter: Payer: Self-pay | Admitting: Cardiology

## 2020-04-11 VITALS — BP 130/71 | HR 88 | Temp 98.4°F | Ht 76.0 in | Wt 296.0 lb

## 2020-04-11 DIAGNOSIS — Z955 Presence of coronary angioplasty implant and graft: Secondary | ICD-10-CM | POA: Diagnosis not present

## 2020-04-11 DIAGNOSIS — Z720 Tobacco use: Secondary | ICD-10-CM

## 2020-04-11 DIAGNOSIS — E785 Hyperlipidemia, unspecified: Secondary | ICD-10-CM | POA: Diagnosis not present

## 2020-04-11 DIAGNOSIS — I25119 Atherosclerotic heart disease of native coronary artery with unspecified angina pectoris: Secondary | ICD-10-CM

## 2020-04-11 DIAGNOSIS — E118 Type 2 diabetes mellitus with unspecified complications: Secondary | ICD-10-CM | POA: Diagnosis not present

## 2020-04-11 DIAGNOSIS — I10 Essential (primary) hypertension: Secondary | ICD-10-CM

## 2020-04-11 DIAGNOSIS — E1165 Type 2 diabetes mellitus with hyperglycemia: Secondary | ICD-10-CM

## 2020-04-11 DIAGNOSIS — Z794 Long term (current) use of insulin: Secondary | ICD-10-CM

## 2020-04-11 DIAGNOSIS — E669 Obesity, unspecified: Secondary | ICD-10-CM

## 2020-04-11 MED ORDER — LOSARTAN POTASSIUM 50 MG PO TABS: 50.0000 mg | ORAL_TABLET | Freq: Every day | ORAL | 0 refills | Status: DC

## 2020-04-11 NOTE — Patient Instructions (Addendum)
Medication Instructions:   stop lisinopril - due to cough  start Losartan  50 mg one tablet daily   May use low dose nicotine patch to aid in not smoking   *If you need a refill on your cardiac medications before your next appointment, please call your pharmacy*   Lab Work:  NOTNEEDED   Testing/Procedures:  NOT NEEDED  Follow-Up: At [facility], you and your health needs are our priority.  As part of our continuing mission to provide you with exceptional heart care, we have created designated Provider Care Teams.  These Care Teams include your primary Cardiologist (physician) and Advanced Practice Providers (APPs -  Physician Assistants and Nurse Practitioners) who all work together to provide you with the care you need, when you need it.     Your next appointment:   6 month(s)  The format for your next appointment:   In Person  Provider:    Glenetta Hew, MD  You have been referred to [place] -discuss options   Other Instructions  FOR SMOKING CESSATION- MAY USE  LOW DOSE  NICOTINE  PATCH- ASK PHARMACIST  Keep exercising

## 2020-04-11 NOTE — Progress Notes (Signed)
Primary Care Provider: Lin Landsman, MD Cardiologist: Glenetta Hew, MD Electrophysiologist: None  Endocrinologist Dr. Kelton Pillar  Clinic Note: Chief Complaint  Patient presents with  . Follow-up    ~8 month   . Coronary Artery Disease    No angina  . Hyperlipidemia     Statin not listed   HPI:    George Dickson is a 49 y.o. male with a PMH notable for Inferior STEMI (June 2013-PCI RCA) along with HTN, HLD and OBESITY, pended OSA evaluation as noted below who presents today for delayed 35-month follow-up.  Problem List Items Addressed This Visit    Presence of drug coated stent in right coronary artery - Promus 3.0 mm x 24 mm - post dilated to 3.3 mm  - Primary (Chronic)   Coronary artery disease involving native coronary artery of native heart with angina pectoris (HCC) (Chronic)   Type 2 diabetes mellitus with complication, with long-term current use of insulin (HCC) (Chronic)   Essential hypertension (Chronic)   Hyperlipidemia with target low density lipoprotein (LDL) cholesterol less than 70 mg/dL (Chronic)   Tobacco use (Chronic)   Obesity, Class II, BMI 35-39.9 (Chronic)     George Dickson was last seen on August 09, 2019 for very delayed, almost 17-month follow-up doing fairly well.  Unfortunately was not taking any medications besides aspirin and symptoms of insulin.  Was noting significant nocturia and urinary frequency.  Thankfully, no chest pain similar to prior MI but does have off-and-on sharp chest pains.  Not reactive.  No exertional chest pain, or dyspnea.  No arrhythmias.  Rare palpitations, fatigue.  I told him to restart atorvastatin 40 mg daily and recheck lipids.    BP was not adequately elevated enough to start ACE inhibitor/ARB.    Chose not to use beta-blocker because of issues with fatigue in the past.  Refill as needed nitroglycerin.  Recent Hospitalizations:   03/03/2020: Viral URI with cough.  Reviewed  CV studies:    The following studies  were reviewed today: (if available, images/films reviewed: From Epic Chart or Care Everywhere) . None:  Interval History:   George Dickson returns here today overall doing fairly well.  He is tracking his steps at work and trying to do at least 10,000 steps a day 4 days a week keep track of it.    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  He says that he every now he will have some nail pinch or pinprick type chest discomfort in the left upper chest.  Does not associate with activity.  He does occasionally take a nitroglycerin as up to 30 minutes the pain dissipates.  He says he may get winded if he goes up and down 3 flights of steps at work, but not with routine activity with 1 flight.  No chest pain.  He also has some abdominal discomfort with indigestion and heartburn usually takes Tums for this.  He has been off of lisinopril for about 3 months because of cough.  He is taking his atorvastatin. He is down to less than a pack.  Actually the last couple weeks he only 1-3 cigarettes a day.  CV Review of Symptoms (Summary): positive for - chest pain and Exertional dyspnea with increased level of activity, daytime sleepiness fatigue negative for - edema, irregular heartbeat, orthopnea, palpitations, paroxysmal nocturnal dyspnea, rapid heart rate, shortness of breath or Syncope/near syncope or TIA/amaurosis fugax,  claudication  The patient does not have symptoms concerning for COVID-19 infection (fever, chills, cough, or new shortness of breath).   REVIEWED OF SYSTEMS   Review of Systems  Constitutional: Positive for malaise/fatigue (More like daytime fatigue.  Does not feel well rested.).  HENT: Negative for congestion and nosebleeds.   Respiratory: Positive for cough (Has been off lisinopril for about 3 months now cough is improved.). Negative for shortness of breath.   Cardiovascular: Negative for leg swelling.  Gastrointestinal: Positive for heartburn.  Negative for blood in stool, constipation and melena.  Genitourinary: Positive for frequency (Nocturia). Negative for flank pain and hematuria.  Musculoskeletal: Positive for joint pain (Normal aches and pains).  Neurological: Negative for dizziness and focal weakness.  Psychiatric/Behavioral: Negative for depression and memory loss. The patient does not have insomnia (Sometimes wakes up at night has a hard to go back to sleep.  Does not feel well rested.).    I have reviewed and (if needed) personally updated the patient's problem list, medications, allergies, past medical and surgical history, social and family history.   PAST MEDICAL HISTORY   Past Medical History:  Diagnosis Date  . Anxiety   . CAD S/P percutaneous coronary angioplasty 10/2011   Insetting of inferior STEMI. Proximal RCA PCI with Promus Element DES - 3.0 mm x 24 mm (3.35-3.2 tapered)  . Dyslipidemia   . GERD (gastroesophageal reflux disease)    occasionally takes TUMS  . Headache(784.0)   . Hypertension   . Kidney stones   . OSA (obstructive sleep apnea) 12/06/2011   initial sleep study  completed, CPAP /BiPAP TITRATION DONE 12/20/11  . Pneumonia   . ST elevation myocardial infarction (STEMI) of inferior wall ([place]) 10/23/2011   Proximal RCA occlusion treated with DES stent  . Type II diabetes mellitus ([place])    "dx'd today" (08/13/2013)    PAST SURGICAL HISTORY   Past Surgical History:  Procedure Laterality Date  . LEFT HEART CATHETERIZATION WITH CORONARY ANGIOGRAM N/A 10/23/2011   Procedure: LEFT HEART CATHETERIZATION WITH CORONARY ANGIOGRAM;  Surgeon: [name], MD;  Location: [facility] CATH LAB;  Service: Cardiovascular;  Laterality: N/A;  . LUMBAR LAMINECTOMY/DECOMPRESSION MICRODISCECTOMY Right 08/17/2013   Procedure: LUMBAR FOUR TO FIVE LUMBAR LAMINECTOMY/DECOMPRESSION MICRODISCECTOMY 1 LEVEL;  Surgeon: Charlie Pitter, MD;  Location: [place] NEURO ORS;  Service: Neurosurgery;  Laterality: Right;  . PERCUTANEOUS  CORONARY STENT INTERVENTION (PCI-S)  10/23/2011   Procedure: PERCUTANEOUS CORONARY STENT INTERVENTION (PCI-S) - With Aspiration Thrombectomy;  Surgeon: [name], MD;  Location: [facility] CATH LAB;  Service: Cardiovascular;; PCI - proximal  RCA with  Promus ELEMENT  DES : 3.0 mm x 72mm ; taper post-dilation: 3.35-3.2 mm  . SHOULDER ARTHROSCOPY Right 1992  . TRANSTHORACIC ECHOCARDIOGRAM  10/25/2011   EF 62-37%;SE diastolic function normal ,systolic function normal     There is no immunization history on file for this patient.  MEDICATIONS/ALLERGIES   Current Meds  Medication Sig  . albuterol (VENTOLIN HFA) 108 (90 Base) MCG/ACT inhaler Inhale 1-2 puffs into the lungs every 6 (six) hours as needed for wheezing or shortness of breath.  Marland Kitchen aspirin EC 81 MG tablet Take 1 tablet (81 mg total) by mouth daily.  Marland Kitchen atorvastatin (LIPITOR) 40 MG tablet Take 40 mg by mouth daily.  . Continuous Blood Gluc Receiver (FREESTYLE LIBRE 2 READER) DEVI 1 Device by Does not apply route as directed.  . Continuous Blood Gluc Sensor (FREESTYLE LIBRE 2 SENSOR) MISC 1 Device by Does not apply  route as directed.  . doxycycline (VIBRAMYCIN) 100 MG capsule Take 1 capsule (100 mg total) by mouth 2 (two) times daily.  Marland Kitchen FARXIGA 10 MG TABS tablet Take 10 mg by mouth daily.  Marland Kitchen HUMALOG KWIKPEN 100 UNIT/ML KwikPen Inject into the skin.  Marland Kitchen icosapent Ethyl (VASCEPA) 1 g capsule Take 2 capsules (2 g total) by mouth 2 (two) times daily.  Marland Kitchen LANTUS SOLOSTAR 100 UNIT/ML Solostar Pen SMARTSIG:80 Unit(s) SUB-Q Every Evening  . ONETOUCH VERIO test strip daily.  . promethazine-dextromethorphan (PROMETHAZINE-DM) 6.25-15 MG/5ML syrup Take 5 mLs by mouth at bedtime as needed for cough.  . [DISCONTINUED] dextromethorphan-guaiFENesin (MUCINEX DM) 30-600 MG 12hr tablet Take 1 tablet by mouth 2 (two) times daily.  . [DISCONTINUED] fluticasone (FLONASE) 50 MCG/ACT nasal spray Place 1-2 sprays into both nostrils daily.  . [DISCONTINUED] lisinopril  (ZESTRIL) 20 MG tablet Take 20 mg by mouth daily.    Allergies  Allergen Reactions  . 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  OBJCTIVE -PE, EKG, labs   Wt Readings from Last 3 Encounters:  04/11/20 296 lb (134.3 kg)  02/22/20 (!) 300 lb 6.4 oz (136.3 kg)  08/09/19 281 lb 6.4 oz (127.6 kg)    Physical Exam: BP 130/71   Pulse 88   Temp 98.4 F (36.9 C)   Ht 6\' 4"  (1.93 m)   Wt 296 lb (134.3 kg)   SpO2 95%   BMI 36.03 kg/m  Physical Exam Constitutional:      General: He is not in acute distress.    Appearance: Normal appearance. He is obese. He is not ill-appearing, toxic-appearing or diaphoretic.  HENT:     Head: Normocephalic and atraumatic.     Comments: Thick beard Neck:     Vascular: No carotid bruit, hepatojugular reflux or JVD.  Cardiovascular:     Rate and Rhythm: Normal rate and regular rhythm.     Chest Wall: PMI is not displaced (Unable to palpate).     Pulses: Normal pulses. No decreased pulses (Difficult to palpate, but present).     Heart sounds: S1 normal and S2 normal. Heart sounds are distant. No murmur heard.  No friction rub. No gallop.   Pulmonary:     Effort: Pulmonary effort is normal. No respiratory distress.     Breath sounds: Normal breath sounds.  Chest:     Chest wall: Tenderness (R sternal border) present.  Musculoskeletal:        General: No swelling. Normal range of motion.     Cervical back: Normal range of motion and neck supple.  Neurological:     General: No focal deficit present.     Mental Status: He is alert and oriented to person, place, and time. Mental status is at baseline.  Psychiatric:        Mood and Affect: Mood normal.        Behavior: Behavior normal.        Thought Content: Thought content normal.        Judgment: Judgment normal.     Adult ECG Report N/a She states Recent Labs: Reviewed, just checked; previous labs TC  227, TG 693, HDL 26, LDL 87.   Lab Results  Component Value Date   CHOL 168 04/09/2020   HDL 30 (L) 04/09/2020   LDLCALC 98 04/09/2020   TRIG 233 (H) 04/09/2020   CHOLHDL 5.6 (H) 04/09/2020   Lab Results  Component Value Date   CREATININE 0.83 04/09/2020   BUN 9 04/09/2020   NA 138 04/09/2020   K 4.8 04/09/2020   CL 104 04/09/2020   CO2 23 04/09/2020   Lab Results  Component Value Date   TSH 2.177 09/20/2018    ASSESSMENT/PLAN    Problem List Items Addressed This Visit    Presence of drug coated stent in right coronary artery - Promus 3.0 mm x 24 mm - post dilated to 3.3 mm  (Chronic)    RCA PCI in setting of inferior STEMI back in 2013.  No longer on Plavix.  He is on aspirin alone.      Relevant Orders   AMB Referral to [facility] Pharm-D   Coronary artery disease involving native coronary artery of native heart with angina pectoris ([place]) - Primary (Chronic)    Intermittent chest discomfort that seems more musculoskeletal in nature.  He is doing lots of activity and exercising work noticing any chest discomfort.  Plan: For now continue atorvastatin and aspirin  Convert from ACE inhibitor to ARB with losartan 50 mg daily.  Referral to CVRR for lipid management  Anticipate initiating low-dose beta-blocker once we see how he does on the ARB.      Relevant Medications   atorvastatin (LIPITOR) 40 MG tablet   Type 2 diabetes mellitus with complication, with long-term current use of insulin (HCC) (Chronic)   Relevant Medications   atorvastatin (LIPITOR) 40 MG tablet   Other Relevant Orders   AMB Referral to [facility] Pharm[facility]   Essential hypertension (Chronic)    ACE inhibitor was stopped due to cough.  Plan start losartan 50 mg daily.      Relevant Medications   atorvastatin (LIPITOR) 40 MG tablet   Hyperlipidemia with target low density lipoprotein (LDL) cholesterol less than 70 mg/dL (Chronic)    LDL was 98.  He tells me he is taking his atorvastatin 40 mg  daily.  He is also Vascepa for elevated triglycerides..  Refer to [facility] - Lipid Clinic - consider PSCK9-I.      Relevant Medications   atorvastatin (LIPITOR) 40 MG tablet   Other Relevant Orders   AMB [facility] to [facility]   Tobacco use (Chronic)    ~4-5 min spent discussing smoking cessation & potential options.  At this stage, he should be able to try low dose Nicotine patches - could consider Welbutrin if urges persist.    He seems ready to finally quit.      Obesity, Class II, BMI 35-39.9 (Chronic)    We discussed the importance of dietary adjustment.  He is doing relatively good job staying active, but recommended increased exercise.      Relevant Orders   AMB Referral to [facility] Pharm-D   Type 2 diabetes mellitus with hyperglycemia, with long-term current use of insulin ([place])    He is on Lantus, but also probably on Farxiga.      Relevant Medications   atorvastatin (LIPITOR) 40 MG tablet       COVID-19 Education: The signs and symptoms of COVID-19 were discussed with the patient and how to seek care for testing (follow up with PCP or arrange E-visit).   The importance of social distancing and COVID-19 vaccination was discussed today. 1 min The patient is practicing social distancing & Masking.   I spent a total of 35 minutes with the patient spent in direct patient consultation.  Additional time spent with chart review  /  charting (studies, outside notes, etc): 10 Total Time: 45 min   Current medicines are reviewed at length with the patient today.  (+/- concerns) he is actually taking his atorvastatin 40 mg daily. Had stopped taking lisinopril because of cough a few months ago.  Plan was for him to convert to ARB, but did not get prescription from PCP.  This visit occurred during the SARS-CoV-2 public health emergency.  Safety protocols were in place, including screening questions prior to the visit,  additional usage of staff PPE, and extensive cleaning of exam room while observing appropriate contact time as indicated for disinfecting solutions.  Notice: This dictation was prepared with Dragon dictation along with smaller phrase technology. Any transcriptional errors that result from this process are unintentional and may not be corrected upon review.  Patient Instructions / Medication Changes & Studies & Tests Ordered   Patient Instructions  Medication Instructions:   stop lisinopril - due to cough  start Losartan  50 mg one tablet daily   May use low dose nicotine patch to aid in not smoking   *If you need a refill on your cardiac medications before your next appointment, please call your pharmacy*   Lab Work:  NOTNEEDED   Testing/Procedures:  NOT NEEDED  Follow-Up: At [facility], you and your health needs are our priority.  As part of our continuing mission to provide you with exceptional heart care, we have created designated Provider Care Teams.  These Care Teams include your primary Cardiologist (physician) and Advanced Practice Providers (APPs -  Physician Assistants and Nurse Practitioners) who all work together to provide you with the care you need, when you need it.     Your next appointment:   6 month(s)  The format for your next appointment:   In Person  Provider:    Glenetta Hew, MD  You have been referred to [facility] [place] -discuss options   Other Instructions  FOR SMOKING CESSATION- MAY USE  LOW DOSE  NICOTINE  PATCH- ASK PHARMACIST  Keep exercising     Studies Ordered:   Orders Placed This Encounter  Procedures  . AMB Referral to [facility] Pharm-D     Glenetta Hew, M.D., M.S. Interventional Cardiologist   Pager # [phone] Phone # [phone] [address] [place], [place] [address]   Thank you for choosing Heartcare at [facility]!!

## 2020-04-19 ENCOUNTER — Other Ambulatory Visit: Payer: Self-pay | Admitting: Cardiology

## 2020-04-26 ENCOUNTER — Encounter: Payer: Self-pay | Admitting: Cardiology

## 2020-04-26 NOTE — Assessment & Plan Note (Signed)
He is on Lantus, but also probably on Farxiga.

## 2020-04-26 NOTE — Assessment & Plan Note (Signed)
We discussed the importance of dietary adjustment.  He is doing relatively good job staying active, but recommended increased exercise.

## 2020-04-26 NOTE — Assessment & Plan Note (Signed)
~  4-5 min spent discussing smoking cessation & potential options.  At this stage, he should be able to try low dose Nicotine patches - could consider Welbutrin if urges persist.    He seems ready to finally quit.

## 2020-04-26 NOTE — Assessment & Plan Note (Addendum)
Intermittent chest discomfort that seems more musculoskeletal in nature.  He is doing lots of activity and exercising work noticing any chest discomfort.  Plan: For now continue atorvastatin and aspirin  Convert from ACE inhibitor to ARB with losartan 50 mg daily.  Referral to CV[facility] for lipid management  Anticipate initiating low-dose beta-blocker once we see how he does on the ARB.

## 2020-04-26 NOTE — Assessment & Plan Note (Signed)
RCA PCI in setting of inferior STEMI back in 2013.  No longer on Plavix.  He is on aspirin alone.

## 2020-04-26 NOTE — Assessment & Plan Note (Addendum)
LDL was 98.  He tells me he is taking his atorvastatin 40 mg daily.  He is also Vascepa for elevated triglycerides..  Refer to [facility] - Lipid Clinic - consider PSCK9-I.

## 2020-04-26 NOTE — Assessment & Plan Note (Signed)
ACE inhibitor was stopped due to cough.  Plan start losartan 50 mg daily.

## 2020-05-08 ENCOUNTER — Ambulatory Visit (INDEPENDENT_AMBULATORY_CARE_PROVIDER_SITE_OTHER): Payer: 59 | Admitting: Pharmacist

## 2020-05-08 ENCOUNTER — Other Ambulatory Visit: Payer: Self-pay

## 2020-05-08 VITALS — Ht 75.0 in | Wt 304.6 lb

## 2020-05-08 DIAGNOSIS — E785 Hyperlipidemia, unspecified: Secondary | ICD-10-CM

## 2020-05-08 DIAGNOSIS — I2111 ST elevation (STEMI) myocardial infarction involving right coronary artery: Secondary | ICD-10-CM | POA: Diagnosis not present

## 2020-05-08 MED ORDER — REPATHA SURECLICK 140 MG/ML ~~LOC~~ SOAJ: 140.0000 mg | SUBCUTANEOUS | 0 refills | Status: DC

## 2020-05-08 NOTE — Patient Instructions (Addendum)
Your Results:             Your most recent labs Goal  Total Cholesterol 168 < 200  Triglycerides 233 < 150  HDL (good cholesterol) 30 > 40  LDL (bad cholesterol) 98 < 55   Medication changes: *INCREASE Vascepa to 2 capsules twice daily as prescribed  *START prior-authorization for Repatha 140mg  every 14 days  (sample provided today for 1st dose)  *Okay to decrease atorvastatin to 20mg  daily once Repatha authorization approve by your insurance*  *[email or web address] for co-pay card*  Lab orders: *Fasting blood work after 4 doses of new medication*  Clinic phone: Krist Rosenboom/Kristin/Haleigh : [phone]  Thank you for choosing [facility]

## 2020-05-08 NOTE — Progress Notes (Signed)
Patient ID: George Dickson                 DOB: 08/06/1970                    MRN: [id number]     HPI: George Dickson is a 49 y.o. male patient referred to lipid clinic by Dr Ellyn Hack. PMH is significant for hypertension, hyperlipidemia,obesity, diabetes,tobacco abuse, and CAD s/p STEMI on June 2013. Elevated cholesterol since 2013, currently on atorvastatin 40mg  daily. Patient admits taking medication usually once daily. Compliance with medication more frequent than once daily dosing is very difficult.  Current Medications:  Atorvastatin 40mg  daily - afternoon Vascepa 2gm BID (takes only once daily) between 7-8pm  Intolerances:  lisinopril - cough   LDL goal: <55 mg/dL  Diet: eats every day, fried chicken, salads, fish on Fridays, burgeers arerly  Exercise: walks lots at work  Family History: high cholesterol in mother (stent x 5), brother heart transplant   Social History: smoked ~10 per day, on weekends  Labs: 04/09/2020: CHO 168, TG 233, HDL 30, LDL-c 98 (on atorvastatin 40mg  daily)  Past Medical History:  Diagnosis Date  . Anxiety   . CAD S/P percutaneous coronary angioplasty 10/2011   Insetting of inferior STEMI. Proximal RCA PCI with Promus Element DES - 3.0 mm x 24 mm (3.35-3.2 tapered)  . Dyslipidemia   . GERD (gastroesophageal reflux disease)    occasionally takes TUMS  . Headache(784.0)   . Hypertension   . Kidney stones   . OSA (obstructive sleep apnea) 12/06/2011   initial sleep study  completed, CPAP /BiPAP TITRATION DONE 12/20/11  . Pneumonia   . ST elevation myocardial infarction (STEMI) of inferior wall ([place]) 10/23/2011   Proximal RCA occlusion treated with DES stent  . Type II diabetes mellitus ([place])    "dx'd today" (08/13/2013)    Current Outpatient Medications on File Prior to Visit  Medication Sig Dispense Refill  . albuterol (VENTOLIN HFA) 108 (90 Base) MCG/ACT inhaler Inhale 1-2 puffs into the lungs every 6 (six) hours as needed for wheezing or  shortness of breath. 18 g 0  . aspirin EC 81 MG tablet Take 1 tablet (81 mg total) by mouth daily. 90 tablet 3  . atorvastatin (LIPITOR) 40 MG tablet Take 40 mg by mouth daily.    . Continuous Blood Gluc Receiver (FREESTYLE LIBRE 2 READER) DEVI 1 Device by Does not apply route as directed. 1 each 0  . Continuous Blood Gluc Sensor (FREESTYLE LIBRE 2 SENSOR) MISC 1 Device by Does not apply route as directed. 2 each 11  . FARXIGA 10 MG TABS tablet Take 10 mg by mouth daily.    Marland Kitchen icosapent Ethyl (VASCEPA) 1 g capsule Take 2 capsules (2 g total) by mouth 2 (two) times daily. 120 capsule 3  . LANTUS SOLOSTAR 100 UNIT/ML Solostar Pen SMARTSIG:80 Unit(s) SUB-Q Every Evening    . losartan (COZAAR) 50 MG tablet TAKE 1 TABLET BY MOUTH  DAILY 90 tablet 3  . HUMALOG KWIKPEN 100 UNIT/ML KwikPen Inject into the skin. (Patient not taking: Reported on 05/08/2020)    . nitroGLYCERIN (NITROSTAT) 0.4 MG SL tablet Place 1 tablet (0.4 mg total) under the tongue every 5 (five) minutes as needed for chest pain. (Patient not taking: Reported on 05/08/2020) 25 tablet 6   No current facility-administered medications on file prior to visit.    Allergies  Allergen Reactions  . Lisinopril Cough    Hyperlipidemia with  target low density lipoprotein (LDL) cholesterol less than 70 mg/dL LDL remains above goal for secondary prevention while on high intensity statin therapy.  We discussed positive lifestyle modification and medication compliance. PCSK9i therapy (Repatha/Praluent) is appropriate for this patient We discussed  indication, administration, storage, common side effects, monitoring, and co-pay card available.  Repatha SureClick 140mg  sample (lot [id number], Exp 12/23) provided during office visit for patient self-administration. Will process prior-authorization and follow up as needed. Plan to decrease atorvastatin to 20mg  daily when PCSK9i therapy approved by insurance. Will repeat fasting blood work after 4th dose of  new medication.   George Dickson PharmD, BCPS, [place] [id number] 05/19/2020 2:11 PM

## 2020-05-19 ENCOUNTER — Encounter: Payer: Self-pay | Admitting: Pharmacist

## 2020-05-19 NOTE — Assessment & Plan Note (Signed)
LDL remains above goal for secondary prevention while on high intensity statin therapy.  We discussed positive lifestyle modification and medication compliance. PCSK9i therapy (Repatha/Praluent) is appropriate for this patient We discussed  indication, administration, storage, common side effects, monitoring, and co-pay card available.  Repatha SureClick 140mg  sample (lot , Exp 12/23) provided during office visit for patient self-administration. Will process prior-authorization and follow up as needed. Plan to decrease atorvastatin to 20mg  daily when PCSK9i therapy approved by insurance. Will repeat fasting blood work after 4th dose of new medication.

## 2020-05-21 ENCOUNTER — Telehealth: Payer: Self-pay

## 2020-05-21 DIAGNOSIS — E785 Hyperlipidemia, unspecified: Secondary | ICD-10-CM

## 2020-05-21 MED ORDER — EZETIMIBE 10 MG PO TABS: 10.0000 mg | ORAL_TABLET | Freq: Every day | ORAL | 0 refills | Status: AC

## 2020-05-21 NOTE — Telephone Encounter (Signed)
Called and spoke w/pt's mother about needed to try zetia for 8 weeks Pharmd ALvstad reviewed while Raquel was out and deemed this to be the best course of action. rx sent, labs ordered and instructed to come fasting the pt's mother voiced understanding and stated that she would let the pt know asap

## 2020-05-22 ENCOUNTER — Telehealth: Payer: Self-pay | Admitting: Cardiology

## 2020-05-22 NOTE — Telephone Encounter (Signed)
New Message:       Please call, concerning prior authorization for Repatha.

## 2020-05-22 NOTE — Telephone Encounter (Signed)
No answer.  Pt was given ezetimibe 10 mg daily for 2 months w/ repeat labs.  Repatha denied d/t no evidence of ezetimibe use

## 2020-05-28 ENCOUNTER — Telehealth: Payer: Self-pay | Admitting: Pharmacist Clinician (PhC)/ Clinical Pharmacy Specialist

## 2020-05-28 MED ORDER — REPATHA SURECLICK 140 MG/ML ~~LOC~~ SOAJ: 140.0000 mg | SUBCUTANEOUS | 12 refills | Status: DC

## 2020-05-28 NOTE — Telephone Encounter (Signed)
Received letter from [employer] approving Repatha thru 11/20/20.  Patient notified.  Copay card left at front desk for patient

## 2020-06-26 ENCOUNTER — Ambulatory Visit: Payer: 59 | Admitting: Internal Medicine

## 2020-11-11 ENCOUNTER — Ambulatory Visit
Admission: EM | Admit: 2020-11-11 | Discharge: 2020-11-11 | Disposition: A | Discharge status: Home / Self Care | Payer: 59

## 2020-11-11 ENCOUNTER — Ambulatory Visit (INDEPENDENT_AMBULATORY_CARE_PROVIDER_SITE_OTHER): Payer: 59

## 2020-11-11 ENCOUNTER — Other Ambulatory Visit: Payer: Self-pay

## 2020-11-11 DIAGNOSIS — R1031 Right lower quadrant pain: Secondary | ICD-10-CM | POA: Diagnosis not present

## 2020-11-11 DIAGNOSIS — R1011 Right upper quadrant pain: Secondary | ICD-10-CM | POA: Diagnosis not present

## 2020-11-11 DIAGNOSIS — R109 Unspecified abdominal pain: Secondary | ICD-10-CM | POA: Diagnosis not present

## 2020-11-11 LAB — POCT URINALYSIS DIP (MANUAL ENTRY)
Bilirubin, UA: NEGATIVE
Glucose, UA: >=1000 mg/dL — AB
Leukocytes, UA: NEGATIVE
Nitrite, UA: NEGATIVE
Protein Ur, POC: NEGATIVE mg/dL
Spec Grav, UA: 1.025 (ref 1.010–1.025)
Urobilinogen, UA: 0.2 E.U./dL
pH, UA: 5.5 (ref 5.0–8.0)

## 2020-11-11 MED ORDER — TAMSULOSIN HCL 0.4 MG PO CAPS: 0.4000 mg | ORAL_CAPSULE | Freq: Every day | ORAL | 0 refills | Status: AC

## 2020-11-11 MED ORDER — ONDANSETRON 4 MG PO TBDP: 4.0000 mg | ORAL_TABLET | Freq: Three times a day (TID) | ORAL | 0 refills | Status: AC | PRN

## 2020-11-11 NOTE — ED Triage Notes (Signed)
Onset yesterday morning of "knot like" RLQ abdominal pain. Pt reports that he has a long h/o spells of emesis that lasts for weeks before spontaneously resolving. Denies diarrhea. Notes he hasn't had a BM in two days. Confirms ongoing nausea.   Pt unsure of which meds he's currently taking.

## 2020-11-11 NOTE — Discharge Instructions (Addendum)
Zofran for nausea Flomax daily Strain urine  Please use Miralax for moderate to severe constipation. Take this once a day for the next 2-3 days. Please also start docusate stool softener, twice a day for at least 1 week. If stools become loose, cut down to once a day for another week. If stools remain loose, cut back to 1 pill every other day for a third week. You can stop docusate thereafter and resume as needed for constipation.  To help reduce constipation and promote bowel health: 1. Drink at least 64 ounces of water each day 2. Eat plenty of fiber (fruits, vegetables, whole grains, legumes) 3. Be physically active or exercise including walking, jogging, swimming, yoga, etc. 4. For active constipation use a stool softener (docusate) or an osmotic laxative (like Miralax) each day, or as needed.

## 2020-11-11 NOTE — ED Provider Notes (Signed)
[facility]    CSN: [id number] Arrival date & time: 11/11/20  1411      History   Chief Complaint Chief Complaint  Patient presents with   Abdominal Pain    HPI Tremayne Sheldon Challis is a 50 y.o. male history of CAD, hypertension, GERD, DM type II on insulin, prior kidney stones presenting today for evaluation of right-sided abdominal pain.  Patient reports that he woke up yesterday with discomfort on the right side.  Has been a sharp stabbing sensation which has been constant.  Symptoms have slightly eased off today, but still persistent.  Has had associated nausea and vomiting.  Reports sensation of constipation, last bowel movement 2 days ago.  Passing gas today.  Typically will take Metamucil daily to help with bowels which keeps him regularly with daily bowel movements.  He denies any urinary symptoms.  Has had history of stones and does vaguely recall possible similar symptoms with past stones.  Denies fevers.  Denies prior abdominal surgeries.  HPI  Past Medical History:  Diagnosis Date   Anxiety    CAD S/P percutaneous coronary angioplasty 10/2011   Insetting of inferior STEMI. Proximal RCA PCI with Promus Element DES - 3.0 mm x 24 mm (3.35-3.2 tapered)   Dyslipidemia    GERD (gastroesophageal reflux disease)    occasionally takes TUMS   Headache(784.0)    Hypertension    Kidney stones    OSA (obstructive sleep apnea) 12/06/2011   initial sleep study  completed, CPAP /BiPAP TITRATION DONE 12/20/11   Pneumonia    ST elevation myocardial infarction (STEMI) of inferior wall ([place]) 10/23/2011   Proximal RCA occlusion treated with DES stent   Type II diabetes mellitus ([place])    "dx'd today" (08/13/2013)    Patient Active Problem List   Diagnosis Date Noted   Type 2 diabetes mellitus with hyperglycemia, with long-term current use of insulin (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   Type 2 diabetes mellitus with complication, with long-term current use of insulin ([place]) 08/15/2013   Essential hypertension 08/13/2013   Presence of drug coated stent in right coronary artery - Promus 3.0 mm x 24 mm - post dilated to 3.3 mm  10/24/2011    Class: Acute   Tobacco use 10/23/2011   ST elevation myocardial infarction (STEMI) involving right coronary artery without development of Q waves ([place]) 10/23/2011   Obesity, Class II, BMI 35-39.9 10/23/2011   Coronary artery disease involving native coronary artery of native heart with angina pectoris ([place]) 10/23/2011    Past Surgical History:  Procedure Laterality Date   LEFT HEART CATHETERIZATION WITH CORONARY ANGIOGRAM N/A 10/23/2011   Procedure: LEFT HEART CATHETERIZATION WITH CORONARY ANGIOGRAM;  Surgeon: [name], MD;  Location: [facility] CATH LAB;  Service: Cardiovascular;  Laterality: N/A;   LUMBAR LAMINECTOMY/DECOMPRESSION MICRODISCECTOMY Right 08/17/2013   Procedure: LUMBAR FOUR TO FIVE LUMBAR LAMINECTOMY/DECOMPRESSION MICRODISCECTOMY 1 LEVEL;  Surgeon: Charlie Pitter, MD;  Location: [place] [facility]URO ORS;  Service: Neurosurgery;  Laterality: Right;   PERCUTANEOUS CORONARY STENT INTERVENTION (PCI-S)  10/23/2011   Procedure: PERCUTANEOUS CORONARY STENT INTERVENTION (PCI-S) - With Aspiration Thrombectomy;  Surgeon: [name], MD;  Location: [facility] CATH LAB;  Service: Cardiovascular;; PCI - proximal  RCA with  Promus ELEMENT  DES : 3.0 mm x 43mm ; taper  post-dilation: 3.35-3.2 mm   SHOULDER ARTHROSCOPY Right 1992   TRANSTHORACIC ECHOCARDIOGRAM  10/25/2011   EF 96-75%;FF diastolic function normal ,systolic function normal       Home Medications    Prior to Admission medications   Medication Sig Start Date End Date Taking? Authorizing Provider  ondansetron (ZOFRAN ODT) 4 MG disintegrating tablet Take 1  tablet (4 mg total) by mouth every 8 (eight) hours as needed for nausea or vomiting. 11/11/20  Yes Jasia Hiltunen C, PA-C  tamsulosin (FLOMAX) 0.4 MG CAPS capsule Take 1 capsule (0.4 mg total) by mouth daily for 7 days. 11/11/20 11/18/20 Yes Larrell Rapozo C, PA-C  albuterol (VENTOLIN HFA) 108 (90 Base) MCG/ACT inhaler Inhale 1-2 puffs into the lungs every 6 (six) hours as needed for wheezing or shortness of breath. 03/03/20   Esteven Overfelt C, PA-C  amLODipine (NORVASC) 10 MG tablet Take 1 tablet by mouth daily. 05/21/20   [provider]  aspirin EC 81 MG tablet Take 1 tablet (81 mg total) by mouth daily. 02/03/17   [name], MD  atorvastatin (LIPITOR) 40 MG tablet Take 40 mg by mouth daily.    [provider]  Continuous Blood Gluc Receiver (FREESTYLE LIBRE 2 READER) DEVI 1 Device by Does not apply route as directed. 02/22/20   Shamleffer, Melanie Crazier, MD  Continuous Blood Gluc Sensor (FREESTYLE LIBRE 2 SENSOR) MISC 1 Device by Does not apply route as directed. 02/22/20   Shamleffer, Melanie Crazier, MD  Evolocumab (REPATHA SURECLICK) 638 MG/ML SOAJ Inject 140 mg into the skin every 14 (fourteen) days. 05/28/20   [name], MD  ezetimibe (ZETIA) 10 MG tablet Take 1 tablet (10 mg total) by mouth daily. 05/21/20 08/19/20  [name], MD  FARXIGA 10 MG TABS tablet Take 10 mg by mouth daily. 09/08/19   [provider]  HUMALOG KWIKPEN 100 UNIT/ML KwikPen Inject into the skin. Patient not taking: Reported on 05/08/2020 10/16/19   [provider]  icosapent Ethyl (VASCEPA) 1 g capsule Take 2 capsules (2 g total) by mouth 2 (two) times daily. 12/11/19   [name], MD  LANTUS SOLOSTAR 100 UNIT/ML Solostar Pen SMARTSIG:80 Unit(s) SUB-Q Every Evening 01/30/20   [provider]  losartan (COZAAR) 50 MG tablet TAKE 1 TABLET BY MOUTH  DAILY 04/21/20   [name], MD  nitroGLYCERIN (NITROSTAT) 0.4 MG SL tablet Place 1 tablet (0.4 mg total)  under the tongue every 5 (five) minutes as needed for chest pain. Patient not taking: Reported on 05/08/2020 08/09/19 11/07/19  [name], MD    Family History Family History  Problem Relation Age of Onset   Coronary artery disease Mother    Hypertension Mother    Diabetes type II Mother     Social History Social History   Tobacco Use   Smoking status: Every Day    Packs/day: 0.50    Years: 4.00    Pack years: 2.00    Types: Cigars, Cigarettes   Smokeless tobacco: Never  Substance Use Topics   Alcohol use: Yes   Drug use: No    Comment: in the past     Allergies   Lisinopril   Review of Systems Review of Systems  Constitutional:  Negative for fatigue and fever.  Eyes:  Negative for redness, itching and visual disturbance.  Respiratory:  Negative for shortness of breath.   Cardiovascular:  Negative for chest pain and leg swelling.  Gastrointestinal:  Positive for abdominal  pain, constipation, nausea and vomiting.  Musculoskeletal:  Negative for arthralgias and myalgias.  Skin:  Negative for color change, rash and wound.  Neurological:  Negative for dizziness, syncope, weakness, light-headedness and headaches.    Physical Exam Triage Vital Signs ED Triage Vitals  Enc Vitals Group     BP 11/11/20 1534 (!) 181/84     Pulse Rate 11/11/20 1534 79     Resp 11/11/20 1534 18     Temp 11/11/20 1534 98.5 F (36.9 C)     Temp Source 11/11/20 1534 Oral     SpO2 11/11/20 1534 96 %     Weight --      Height --      Head Circumference --      Peak Flow --      Pain Score 11/11/20 1538 9     Pain Loc --      Pain Edu? --      Excl. in [place]? --    No data found.  Updated Vital Signs BP (!) 181/84 (BP Location: Left Arm)   Pulse 79   Temp 98.5 F (36.9 C) (Oral)   Resp 18   SpO2 96%   Visual Acuity Right Eye Distance:   Left Eye Distance:   Bilateral Distance:    Right Eye Near:   Left Eye Near:    Bilateral Near:     Physical Exam Vitals and  nursing note reviewed.  Constitutional:      Appearance: He is well-developed.     Comments: No acute distress  HENT:     Head: Normocephalic and atraumatic.     Nose: Nose normal.  Eyes:     Conjunctiva/sclera: Conjunctivae normal.  Cardiovascular:     Rate and Rhythm: Normal rate.  Pulmonary:     Effort: Pulmonary effort is normal. No respiratory distress.  Abdominal:     General: There is no distension.     Comments: Abdomen slightly distended, but soft, nontender to palpation to left upper and lower quadrant or epigastrium, tender to palpation to right upper and lower quadrant, slightly more focal in right lower quadrant, negative rebound, negative Rovsing, negative McBurney's  Musculoskeletal:        General: Normal range of motion.     Cervical back: Neck supple.  Skin:    General: Skin is warm and dry.  Neurological:     Mental Status: He is alert and oriented to person, place, and time.     UC Treatments / Results  Labs (all labs ordered are listed, but only abnormal results are displayed) Labs Reviewed  POCT URINALYSIS DIP (MANUAL ENTRY) - Abnormal; Notable for the following components:      Result Value   Glucose, UA >=1,000 (*)    Ketones, POC UA small (15) (*)    Blood, UA trace-intact (*)    All other components within normal limits  CBC WITH DIFFERENTIAL/PLATELET  COMPREHENSIVE METABOLIC PANEL  LIPASE    EKG   Radiology DG Abd 2 Views  Result Date: 11/11/2020 CLINICAL DATA:  50 year old male with right-sided abdominal pain. EXAM: ABDOMEN - 2 VIEW COMPARISON:  Radiograph dated 03/30/2015. CT dated 01/07/2017. FINDINGS: Two adjacent renal calculi over the inferior pole of the left kidney similar to prior CT and measure up to 9 mm. Probable additional faint small stone in the inferior pole the left kidney. An 11 mm faint radiopaque focus over the right renal silhouette may be artifactual or represent a kidney stone. There is  moderate stool throughout the  colon. No bowel dilatation or evidence of obstruction. No Slater air. There is degenerative changes of the spine and hips. The soft tissues are unremarkable. IMPRESSION: 1. Two adjacent stones in the inferior pole of the left kidney. Artifact versus possible stone in the right kidney. 2. No bowel obstruction. Electronically Signed   By: Anner Crete M.D.   On: 11/11/2020 16:59    Procedures Procedures (including critical care time)  Medications Ordered in UC Medications - No data to display  Initial Impression / Assessment and Plan / UC Course  I have reviewed the triage vital signs and the nursing notes.  Pertinent labs & imaging results that were available during my care of the patient were reviewed by me and considered in my medical decision making (see chart for details).     Right-sided abdominal pain- X-ray with moderate stool burden, possible right-sided stone, symptoms slightly eased off today compared to yesterday, will proceed with treatment for possible underlying stone/constipation, recommendations below, push fluids and close monitoring.  Checking blood work to check white count, liver function and lipase given right-sided pain, cannot rule out appendicitis, discussed this with patient and advised to follow-up in emergency room for further evaluation and imaging if pain progressing or worsening.  Discussed strict return precautions. Patient verbalized understanding and is agreeable with plan.  Final Clinical Impressions(s) / UC Diagnoses   Final diagnoses:  Abdominal pain, right upper quadrant  Right lower quadrant abdominal pain     Discharge Instructions      Zofran for nausea Flomax daily Strain urine  Please use Miralax for moderate to severe constipation. Take this once a day for the next 2-3 days. Please also start docusate stool softener, twice a day for at least 1 week. If stools become loose, cut down to once a day for another week. If stools remain loose,  cut back to 1 pill every other day for a third week. You can stop docusate thereafter and resume as needed for constipation.  To help reduce constipation and promote bowel health: 1. Drink at least 64 ounces of water each day 2. Eat plenty of fiber (fruits, vegetables, whole grains, legumes) 3. Be physically active or exercise including walking, jogging, swimming, yoga, etc. 4. For active constipation use a stool softener (docusate) or an osmotic laxative (like Miralax) each day, or as needed.    ED Prescriptions     Medication Sig Dispense Auth. Provider   ondansetron (ZOFRAN ODT) 4 MG disintegrating tablet Take 1 tablet (4 mg total) by mouth every 8 (eight) hours as needed for nausea or vomiting. 20 tablet Koltyn Kelsay C, PA-C   tamsulosin (FLOMAX) 0.4 MG CAPS capsule Take 1 capsule (0.4 mg total) by mouth daily for 7 days. 7 capsule Kiyo Heal, [place] C, PA-C      PDMP not reviewed this encounter.   Janith Lima, [place] 11/11/20 1736

## 2020-11-12 LAB — CBC WITH DIFFERENTIAL/PLATELET
Basophils Absolute: 0.1 10*3/uL (ref 0.0–0.2)
Basos: 0 %
EOS (ABSOLUTE): 0.4 10*3/uL (ref 0.0–0.4)
Eos: 4 %
Hematocrit: 47.4 % (ref 37.5–51.0)
Hemoglobin: 15.4 g/dL (ref 13.0–17.7)
Immature Grans (Abs): 0 10*3/uL (ref 0.0–0.1)
Immature Granulocytes: 0 %
Lymphocytes Absolute: 2.3 10*3/uL (ref 0.7–3.1)
Lymphs: 20 %
MCH: 26.9 pg (ref 26.6–33.0)
MCHC: 32.5 g/dL (ref 31.5–35.7)
MCV: 83 fL (ref 79–97)
Monocytes Absolute: 0.6 10*3/uL (ref 0.1–0.9)
Monocytes: 5 %
Neutrophils Absolute: 8.2 10*3/uL — ABNORMAL HIGH (ref 1.4–7.0)
Neutrophils: 71 %
Platelets: 200 10*3/uL (ref 150–450)
RBC: 5.73 x10E6/uL (ref 4.14–5.80)
RDW: 13.8 % (ref 11.6–15.4)
WBC: 11.7 10*3/uL — ABNORMAL HIGH (ref 3.4–10.8)

## 2020-11-12 LAB — LIPASE: Lipase: 47 U/L (ref 13–78)

## 2020-11-12 LAB — COMPREHENSIVE METABOLIC PANEL
ALT: 21 IU/L (ref 0–44)
AST: 20 IU/L (ref 0–40)
Albumin/Globulin Ratio: 2 (ref 1.2–2.2)
Albumin: 4.4 g/dL (ref 4.0–5.0)
Alkaline Phosphatase: 108 IU/L (ref 44–121)
BUN/Creatinine Ratio: 15 (ref 9–20)
BUN: 14 mg/dL (ref 6–24)
Bilirubin Total: 0.4 mg/dL (ref 0.0–1.2)
CO2: 24 mmol/L (ref 20–29)
Calcium: 10.1 mg/dL (ref 8.7–10.2)
Chloride: 102 mmol/L (ref 96–106)
Creatinine, Ser: 0.95 mg/dL (ref 0.76–1.27)
Globulin, Total: 2.2 g/dL (ref 1.5–4.5)
Glucose: 273 mg/dL — ABNORMAL HIGH (ref 65–99)
Potassium: 4.1 mmol/L (ref 3.5–5.2)
Sodium: 139 mmol/L (ref 134–144)
Total Protein: 6.6 g/dL (ref 6.0–8.5)
eGFR: 98 mL/min/{1.73_m2} (ref >59)

## 2020-11-28 ENCOUNTER — Telehealth: Payer: Self-pay

## 2020-11-28 NOTE — Telephone Encounter (Signed)
Repatha pa denied due to needed labs and I was unable to lmom pt

## 2020-12-15 ENCOUNTER — Telehealth: Payer: Self-pay

## 2020-12-15 NOTE — Telephone Encounter (Signed)
Pt stated that they never started and still would like to start so new pa submitted and pt voiced understanding

## 2020-12-22 NOTE — Telephone Encounter (Signed)
Pt was denied for the repatha so I sent in an appeal to [facility] at fax number [phone] and will await an answer

## 2020-12-25 ENCOUNTER — Telehealth: Payer: Self-pay | Admitting: Cardiology

## 2020-12-25 NOTE — Telephone Encounter (Signed)
Currently awaiting detreminationfrom an appeal

## 2020-12-25 NOTE — Telephone Encounter (Signed)
Will forward to [place] handling Repatha

## 2020-12-25 NOTE — Telephone Encounter (Signed)
Pt c/o medication issue: 1. Name of Medication: Repatha  2. How are you currently taking this medication (dosage and times per day)? Once a day 3. Are you having a reaction (difficulty breathing--STAT)?  No  4. What is your medication issue? [facility]  They are faxing over some information.

## 2020-12-26 ENCOUNTER — Telehealth: Payer: Self-pay | Admitting: Cardiology

## 2020-12-26 NOTE — Telephone Encounter (Signed)
Pt c/o medication issue: 1. Name of Medication: Repatha 2. How are you currently taking this medication (dosage and times per day)? Once a day  3. Are you having a reaction (difficulty breathing--STAT)?  No  4. What is your medication issue? [facility] need to speak with some one concerning this patient Repatha.

## 2020-12-30 NOTE — Telephone Encounter (Signed)
Called and spoke to rep at [facility] to give them additional information. They voiced understanding and will pass it alongto pharmd there for determination

## 2021-01-02 MED ORDER — REPATHA SURECLICK 140 MG/ML ~~LOC~~ SOAJ: 140.0000 mg | SUBCUTANEOUS | 11 refills | Status: DC

## 2021-01-02 NOTE — Addendum Note (Signed)
Addended by: Allean Found on: 01/02/2021 11:26 AM   Modules accepted: Orders

## 2021-01-02 NOTE — Telephone Encounter (Signed)
Called and was able to lmom d to a full mailbox that their denial for prior authorization of repatha was overturned and that they need to complete fasting abs post 4th dose

## 2021-01-30 ENCOUNTER — Other Ambulatory Visit: Payer: Self-pay

## 2021-01-30 MED ORDER — REPATHA SURECLICK 140 MG/ML ~~LOC~~ SOAJ: 140.0000 mg | SUBCUTANEOUS | 3 refills | Status: DC

## 2021-07-01 ENCOUNTER — Other Ambulatory Visit: Payer: Self-pay

## 2021-07-01 DIAGNOSIS — E785 Hyperlipidemia, unspecified: Secondary | ICD-10-CM

## 2021-09-23 LAB — LIPID PANEL
Chol/HDL Ratio: 5 ratio (ref 0.0–5.0)
Cholesterol, Total: 171 mg/dL (ref 100–199)
HDL: 34 mg/dL — ABNORMAL LOW (ref >39)
LDL Chol Calc (NIH): 79 mg/dL (ref 0–99)
Triglycerides: 359 mg/dL — ABNORMAL HIGH (ref 0–149)
VLDL Cholesterol Cal: 58 mg/dL — ABNORMAL HIGH (ref 5–40)

## 2021-09-23 LAB — HEPATIC FUNCTION PANEL
ALT: 29 IU/L (ref 0–44)
AST: 23 IU/L (ref 0–40)
Albumin: 4.4 g/dL (ref 4.0–5.0)
Alkaline Phosphatase: 123 IU/L — ABNORMAL HIGH (ref 44–121)
Bilirubin Total: 0.4 mg/dL (ref 0.0–1.2)
Bilirubin, Direct: 0.13 mg/dL (ref 0.00–0.40)
Total Protein: 6.8 g/dL (ref 6.0–8.5)

## 2021-09-28 ENCOUNTER — Telehealth: Payer: Self-pay

## 2021-09-28 NOTE — Telephone Encounter (Signed)
-----   Message from [name], [place] sent at 09/25/2021 11:26 AM EDT ----- ?Can you call pharmacy to check compliance on atorvastatin, ezetimibe and Repatha? ? ?Thanks, ? ? ?

## 2021-09-28 NOTE — Telephone Encounter (Signed)
Called the [facility] and [facility] pharmacies and they stated med compliance was as follows: ? ?Lipitor: none on file at [facility], last picked up in 2021 on 10/9 from [place] for 30 day supply.  ? ?Zetia: none on file at [facility], [facility] stated 05/21/20 fr 30 day supply.  ? ?Repatha: last shipment 02/02/2021 for 84 days, [facility] stated last picked up 09/18/21  for one month supply.  ? ?Will route this message back to Dr. Brion Aliment [facility] as she requested ?

## 2022-01-06 ENCOUNTER — Ambulatory Visit: Payer: 59 | Admitting: Family

## 2022-02-25 ENCOUNTER — Other Ambulatory Visit: Payer: Self-pay | Admitting: Cardiology

## 2022-03-02 ENCOUNTER — Other Ambulatory Visit: Payer: Self-pay | Admitting: Cardiology

## 2022-03-02 DIAGNOSIS — I25119 Atherosclerotic heart disease of native coronary artery with unspecified angina pectoris: Secondary | ICD-10-CM

## 2022-03-02 DIAGNOSIS — I2111 ST elevation (STEMI) myocardial infarction involving right coronary artery: Secondary | ICD-10-CM

## 2022-03-02 DIAGNOSIS — E785 Hyperlipidemia, unspecified: Secondary | ICD-10-CM

## 2022-03-23 ENCOUNTER — Other Ambulatory Visit: Payer: Self-pay | Admitting: Cardiology

## 2022-03-23 DIAGNOSIS — I2111 ST elevation (STEMI) myocardial infarction involving right coronary artery: Secondary | ICD-10-CM

## 2022-03-23 DIAGNOSIS — I25119 Atherosclerotic heart disease of native coronary artery with unspecified angina pectoris: Secondary | ICD-10-CM

## 2022-03-23 DIAGNOSIS — E785 Hyperlipidemia, unspecified: Secondary | ICD-10-CM

## 2022-04-05 ENCOUNTER — Encounter: Payer: Self-pay | Admitting: Cardiology

## 2022-04-05 ENCOUNTER — Other Ambulatory Visit: Payer: Self-pay

## 2022-04-05 ENCOUNTER — Ambulatory Visit: Payer: 59 | Attending: Cardiology | Admitting: Cardiology

## 2022-04-05 VITALS — BP 148/78 | HR 69 | Ht 75.0 in | Wt 295.6 lb

## 2022-04-05 DIAGNOSIS — I10 Essential (primary) hypertension: Secondary | ICD-10-CM

## 2022-04-05 DIAGNOSIS — I25119 Atherosclerotic heart disease of native coronary artery with unspecified angina pectoris: Secondary | ICD-10-CM

## 2022-04-05 DIAGNOSIS — E1169 Type 2 diabetes mellitus with other specified complication: Secondary | ICD-10-CM

## 2022-04-05 DIAGNOSIS — I2111 ST elevation (STEMI) myocardial infarction involving right coronary artery: Secondary | ICD-10-CM

## 2022-04-05 DIAGNOSIS — E785 Hyperlipidemia, unspecified: Secondary | ICD-10-CM

## 2022-04-05 DIAGNOSIS — E669 Obesity, unspecified: Secondary | ICD-10-CM

## 2022-04-05 DIAGNOSIS — Z794 Long term (current) use of insulin: Secondary | ICD-10-CM

## 2022-04-05 DIAGNOSIS — E118 Type 2 diabetes mellitus with unspecified complications: Secondary | ICD-10-CM

## 2022-04-05 MED ORDER — NITROGLYCERIN 0.4 MG SL SUBL: 0.4000 mg | SUBLINGUAL_TABLET | SUBLINGUAL | 6 refills | Status: AC | PRN

## 2022-04-05 MED ORDER — LOSARTAN POTASSIUM 100 MG PO TABS: 100.0000 mg | ORAL_TABLET | Freq: Every day | ORAL | 3 refills | Status: DC

## 2022-04-05 NOTE — Progress Notes (Signed)
Primary Care Provider: Lin Landsman, [place] Cardiologist: Glenetta Hew, MD Electrophysiologist: None  Clinic Note: Chief Complaint  Patient presents with   Follow-up    Almost 2 years   Coronary Artery Disease    No active angina.   Hyperlipidemia    Unfortunately, he did not get his Repatha refilled.  [place] to go back through the prior authorization paperwork.   ===================================  ASSESSMENT/PLAN   Problem List Items Addressed This Visit       Cardiology Problems   ST elevation myocardial infarction (STEMI) involving right coronary artery without development of Q waves (HCC) (Chronic)    Distant history of MI at a young age.  Unfortunately, the fact that he has not had any residual symptoms or CHF has made the sense that he has been somewhat carefree and lackadaisical about his care management.  He has been 2 years since have seen him and has been off his medications.  We now have to figure out what is he is taking and what he is not taking.      Relevant Medications   nitroGLYCERIN (NITROSTAT) 0.4 MG SL tablet   losartan (COZAAR) 100 MG tablet   Other Relevant Orders   EKG 12-Lead (Completed)   Hepatic function panel (Completed)   Lipid panel (Completed)   Hemoglobin A1c (Completed)   Coronary artery disease involving native coronary artery of native heart with angina pectoris ([place]) - Primary (Chronic)    Doing well with no recurrent angina symptoms on high-dose amlodipine.  Anticipate beta-blocker once we have to get him back on meds and see how he does from and fatigue standpoint. Continue 10 mg amlodipine Increase losartan to 100 mg daily, pending blood pressure response, would probably consider beta-blocker such as carvedilol. Agree with Wilder Glade and Ozempic-anticipate titrating up Ozempic dose. Currently taking Zetia and atorvastatin but not tolerating as well.  Also needs to have his Repatha refilled. I would like to be on  rosuvastatin as opposed to atorvastatin.  He is to call us and see which when he is actually taking. Will need to renew Repatha       Relevant Medications   nitroGLYCERIN (NITROSTAT) 0.4 MG SL tablet   losartan (COZAAR) 100 MG tablet   Other Relevant Orders   EKG 12-Lead (Completed)   Hepatic function panel (Completed)   Lipid panel (Completed)   Hemoglobin A1c (Completed)   Hyperlipidemia associated with type 2 diabetes mellitus ([place]) (Chronic)    Me to clarify which medication he is on-rosuvastatin or Lipitor/atorvastatin. Would like for him to be on rosuvastatin for 40 mg along with Zetia.  Also need to get the paperwork filled out for him to be back on Repatha.  But we probably have to have labs checked first.  Checking lipids and LFTs as well as A1c now.  Continue Farxiga and Ozempic with plans to probably titrate up Ozempic.Marland Kitchen  He is also on Vascepa..  For diabetes he is on glargine but not on short acting insulin.      Relevant Medications   OZEMPIC, 0.25 OR 0.5 MG/DOSE, 2 MG/3ML SOPN   nitroGLYCERIN (NITROSTAT) 0.4 MG SL tablet   losartan (COZAAR) 100 MG tablet   Other Relevant Orders   EKG 12-Lead (Completed)   Hepatic function panel (Completed)   Lipid panel (Completed)   Hemoglobin A1c (Completed)   Essential hypertension (Chronic)    Inadequately controlled blood pressure.  Plan: Continue 10 mg of amlodipine and increase losartan to 100 mg  daily.  Would probably try to start carvedilol at his next follow-up.      Relevant Medications   nitroGLYCERIN (NITROSTAT) 0.4 MG SL tablet   losartan (COZAAR) 100 MG tablet     Other   Type 2 diabetes mellitus with complication, with long-term current use of insulin (HCC) (Chronic)   Relevant Medications   OZEMPIC, 0.25 OR 0.5 MG/DOSE, 2 MG/3ML SOPN   losartan (COZAAR) 100 MG tablet   Other Relevant Orders   EKG 12-Lead (Completed)   Hepatic function panel (Completed)   Lipid panel (Completed)   Hemoglobin A1c  (Completed)   Obesity, Class II, BMI 35-39.9 (Chronic)   Relevant Orders   EKG 12-Lead (Completed)   Hepatic function panel (Completed)   Lipid panel (Completed)   Hemoglobin A1c (Completed)    ===================================  HPI:    George Dickson is a 51 y.o. male with a PMH below who presents today for 2-year follow-up-he missed his 1 year follow-up..  Coronary artery disease involving native coronary artery of native heart with angina pectoris (HCC) (Chronic) Presence of drug coated stent in right coronary artery - Promus 3.0 mm x 24 mm - post dilated to 3.3 mm  - Primary (Chronic)    Type 2 diabetes mellitus with complication, with long-term current use of insulin (HCC) (Chronic)    Essential hypertension (Chronic)   -- Had been on Repatha - not renewed b/c did not come to appt.  Hyperlipidemia with target low density lipoprotein (LDL) cholesterol less than 70 mg/dL (Chronic)    Tobacco use (Chronic)    Obesity, Class II, BMI 35-39.9 (Chronic)    George Dickson was last seen on April 11, 2020 -> was doing fairly well.  Trying to do about thousand steps a day.  Work for [employer] as a Librarian, academic.  Mild rare pinprick type chest discomfort.  Not associate with activity.  Refer to [facility]  He was actually last seen in December 2021 by Harrington Challenger, RPH-CCP [facility] to discuss lipids.  He was given prescription for Repatha and told to reduce his atorvastatin to 20 mg. => Unfortunately, he did not return for follow-up visits and therefore has not had his Repatha renewed.  Recent Hospitalizations:  ER visit 11/11/2020 for abdominal pain.  Reviewed  CV studies:    The following studies were reviewed today: (if available, images/films reviewed: From Epic Chart or Care Everywhere) No recurrent studies.:  Interval History:   George Dickson returns here today for delayed follow-up actually doing pretty well considering that he has been off medications for a while.  He  really has not had any significant episodes of chest pain or pressure just a rare pinpricks over now and then.  Thankfully, he had gained weight and then lost back so he is not any heavier than he was but still has not really lost weight.  He has also lost touch with his primary doctor to manage his glycemic control as well.  He does note that he gets muscle aches when he takes his Zetia and Lipitor together.  Off-and-on musculoskeletal sounding chest pain where certain movements make it worse but not exertion.  He does sweat profusely with minimal activity.  He also notes GERD.  He had gained the weight and but since starting on Ozempic has lost about 10 pounds.  He is hoping that when he goes up to the higher doses he will lose more.  CV Review of Symptoms (Summary): no chest pain or  dyspnea on exertion positive for - sweats a lot; off & on MSK related CP episodes & GERD negative for - edema, irregular heartbeat, orthopnea, palpitations, paroxysmal nocturnal dyspnea, rapid heart rate, shortness of breath, or syncope/near syncope; TIA/amaurosis fugax, claudication. Poor sleep.  Sweats @ night; daytime sleepiness (never went to get CPCP machine years ago)   REVIEWED OF SYSTEMS   Review of Systems  Constitutional:  Positive for weight loss (He is lost 10 pounds since starting Ozempic.  He had previously gained some). Negative for malaise/fatigue (Not really.  He he does say that he sometimes feels that he is not well rested during the day.).  Respiratory:  Negative for cough.   Cardiovascular:  Positive for leg swelling (Mild).  Gastrointestinal:  Negative for blood in stool and melena.  Genitourinary:  Positive for frequency. Negative for dysuria, flank pain and hematuria.  Musculoskeletal:  Positive for joint pain. Negative for falls and myalgias.  Neurological:  Negative for dizziness and focal weakness.  Psychiatric/Behavioral:  Negative for depression and memory loss. The patient has  insomnia (Just does not sleep well.-Says that he has a hard time going back to sleep when he gets up to urinate.). The patient is not nervous/anxious.   All other systems reviewed and are negative.   I have reviewed and (if needed) personally updated the patient's problem list, medications, allergies, past medical and surgical history, social and family history.   PAST MEDICAL HISTORY   Past Medical History:  Diagnosis Date   Anxiety    CAD S/P percutaneous coronary angioplasty 10/2011   Insetting of inferior STEMI. Proximal RCA PCI with Promus Element DES - 3.0 mm x 24 mm (3.35-3.2 tapered)   Dyslipidemia    GERD (gastroesophageal reflux disease)    occasionally takes TUMS   Headache(784.0)    Hypertension    Kidney stones    OSA (obstructive sleep apnea) 12/06/2011   initial sleep study  completed, CPAP /BiPAP TITRATION DONE 12/20/11   Pneumonia    ST elevation myocardial infarction (STEMI) of inferior wall ([place]) 10/23/2011   Proximal RCA occlusion treated with DES stent   Type II diabetes mellitus ([place])    "dx'd today" (08/13/2013)    PAST SURGICAL HISTORY   Past Surgical History:  Procedure Laterality Date   LEFT HEART CATHETERIZATION WITH CORONARY ANGIOGRAM N/A 10/23/2011   Procedure: LEFT HEART CATHETERIZATION WITH CORONARY ANGIOGRAM;  Surgeon: [name], MD;  Location: [facility] CATH LAB;  Service: Cardiovascular;  Laterality: N/A;   LUMBAR LAMINECTOMY/DECOMPRESSION MICRODISCECTOMY Right 08/17/2013   Procedure: LUMBAR FOUR TO FIVE LUMBAR LAMINECTOMY/DECOMPRESSION MICRODISCECTOMY 1 LEVEL;  Surgeon: Charlie Pitter, MD;  Location: [place] NEURO ORS;  Service: Neurosurgery;  Laterality: Right;   PERCUTANEOUS CORONARY STENT INTERVENTION (PCI-S)  10/23/2011   Procedure: PERCUTANEOUS CORONARY STENT INTERVENTION (PCI-S) - With Aspiration Thrombectomy;  Surgeon: [name], MD;  Location: [facility] CATH LAB;  Service: Cardiovascular;; PCI - proximal  RCA with  Promus ELEMENT  DES : 3.0 mm x 29m ;  taper post-dilation: 3.35-3.2 mm   SHOULDER ARTHROSCOPY Right 1992   TRANSTHORACIC ECHOCARDIOGRAM  10/25/2011   EF 676-16%;WVdiastolic function normal ,systolic function normal     There is no immunization history on file for this patient.  MEDICATIONS/ALLERGIES   Current Meds  Medication Sig   albuterol (VENTOLIN HFA) 108 (90 Base) MCG/ACT inhaler Inhale 1-2 puffs into the lungs every 6 (six) hours as needed for wheezing or shortness of breath.   amLODipine (NORVASC) 10 MG tablet  Take 1 tablet by mouth daily.   aspirin EC 81 MG tablet Take 1 tablet (81 mg total) by mouth daily.   atorvastatin (LIPITOR) 40 MG tablet Take 40 mg by mouth daily.   FARXIGA 10 MG TABS tablet Take 10 mg by mouth daily.   HUMALOG KWIKPEN 100 UNIT/ML KwikPen Inject into the skin.   icosapent Ethyl (VASCEPA) 1 g capsule Take 2 capsules (2 g total) by mouth 2 (two) times daily.   LANTUS SOLOSTAR 100 UNIT/ML Solostar Pen SMARTSIG:80 Unit(s) SUB-Q Every Evening   losartan (COZAAR) 50 MG tablet TAKE 1 TABLET BY MOUTH  DAILY   nitroGLYCERIN (NITROSTAT) 0.4 MG SL tablet Place 1 tablet (0.4 mg total) under the tongue every 5 (five) minutes as needed for chest pain.   ondansetron (ZOFRAN ODT) 4 MG disintegrating tablet Take 1 tablet (4 mg total) by mouth every 8 (eight) hours as needed for nausea or vomiting.   OZEMPIC, 0.25 OR 0.5 MG/DOSE, 2 MG/3ML SOPN SMARTSIG:0.25 Milligram(s) SUB-Q Once a Week   REPATHA SURECLICK 979 MG/ML SOAJ INJECT 140 MG INTO THE SKIN EVERY 14 DAYS => currently does not have refill prescription    Allergies  Allergen Reactions   Lisinopril Cough    SOCIAL HISTORY/FAMILY HISTORY   Reviewed in Epic:  Pertinent findings:  Social History   Tobacco Use   Smoking status: Every Day    Packs/day: 0.50    Years: 4.00    Total pack years: 2.00    Types: Cigars, Cigarettes   Smokeless tobacco: Never  Substance Use Topics   Alcohol use: Yes   Drug use: No    Comment: in the past   Social  History   Social History Narrative   Not on file    OBJCTIVE -PE, EKG, labs   Wt Readings from Last 3 Encounters:  04/05/22 295 lb 9.6 oz (134.1 kg)  05/08/20 (!) 304 lb 9.6 oz (138.2 kg)  04/11/20 296 lb (134.3 kg)    Physical Exam: BP (!) 148/78 (BP Location: Left Arm, Patient Position: Sitting, Cuff Size: Large)   Pulse 69   Ht '6\' 3"'$  (1.905 m)   Wt 295 lb 9.6 oz (134.1 kg)   SpO2 98%   BMI 36.95 kg/m  Physical Exam Vitals reviewed.  Constitutional:      General: He is not in acute distress.    Appearance: Normal appearance. He is obese. He is not toxic-appearing.     Comments: Well-groomed.  Does have thick beard.  HENT:     Head: Normocephalic and atraumatic.  Neck:     Vascular: No carotid bruit or JVD.  Cardiovascular:     Rate and Rhythm: Normal rate and regular rhythm. No extrasystoles are present.    Chest Wall: PMI is not displaced (Difficult to palpate).     Pulses: Intact distal pulses.     Heart sounds: S1 normal and S2 normal. Heart sounds are distant. No murmur heard.    No friction rub. No gallop.  Pulmonary:     Effort: Pulmonary effort is normal. No respiratory distress.     Breath sounds: Normal breath sounds. No wheezing, rhonchi or rales.  Chest:     Chest wall: Tenderness (Right upper sternal border) present.  Musculoskeletal:        General: Swelling present. Normal range of motion.     Cervical back: Normal range of motion and neck supple.  Skin:    General: Skin is warm and dry.  Neurological:  General: No focal deficit present.     Mental Status: He is alert and oriented to person, place, and time.  Psychiatric:        Mood and Affect: Mood normal.        Behavior: Behavior normal.        Thought Content: Thought content normal.        Judgment: Judgment normal.     Adult ECG Report  Rate: 69 ;  Rhythm: normal sinus rhythm and normal axis, intervals and durations. ;   Narrative Interpretation: Normal  Recent Labs: Reviewed.   Triglycerides are quite high.  Should be due for labs since has been off of his meds. Lab Results  Component Value Date   CHOL 171 09/23/2021   HDL 34 (L) 09/23/2021   LDLCALC 79 09/23/2021   TRIG 359 (H) 09/23/2021   CHOLHDL 5.0 09/23/2021   Lab Results  Component Value Date   CREATININE 0.95 11/11/2020   BUN 14 11/11/2020   NA 139 11/11/2020   K 4.1 11/11/2020   CL 102 11/11/2020   CO2 24 11/11/2020      Latest Ref Rng & Units 11/11/2020    4:32 PM 09/20/2018    5:15 PM 09/20/2018    4:55 PM  CBC  WBC 3.4 - 10.8 x10E3/uL 11.7   8.7   Hemoglobin 13.0 - 17.7 g/dL 15.4  16.7  16.9   Hematocrit 37.5 - 51.0 % 47.4  49.0  51.8   Platelets 150 - 450 x10E3/uL 200   200     Lab Results  Component Value Date   HGBA1C 10.0 (H) 04/09/2020   Lab Results  Component Value Date   TSH 2.177 09/20/2018    ================================================== I spent a total of 23 minutes with the patient spent in direct patient consultation.  Additional time spent with chart review  / charting (studies, outside notes, etc): 18 min Total Time: 41 min  Current medicines are reviewed at length with the patient today.  (+/- concerns) none  Notice: This dictation was prepared with Dragon dictation along with smart phrase technology. Any transcriptional errors that result from this process are unintentional and may not be corrected upon review.  Studies Ordered:   Orders Placed This Encounter  Procedures   Hepatic function panel   Lipid panel   Hemoglobin A1c   EKG 12-Lead   Meds ordered this encounter  Medications   nitroGLYCERIN (NITROSTAT) 0.4 MG SL tablet    Sig: Place 1 tablet (0.4 mg total) under the tongue every 5 (five) minutes as needed for chest pain.    Dispense:  25 tablet    Refill:  6   losartan (COZAAR) 100 MG tablet    Sig: Take 1 tablet (100 mg total) by mouth daily.    Dispense:  90 tablet    Refill:  3    Patient Instructions / Medication Changes & Studies  & Tests Ordered   Patient Instructions  Medication Instructions:    Check to see if you are taking  Atorvastatin or Lipitor - call office  to let us know  [phone] (all medication)   Stop taking Losartan 50 mg  Start taking losartan 100 mg - one tablet daily   *If you need a refill on your cardiac medications before your next appointment, please call your pharmacy*   Lab Work: Lipid Liver panel  hgba1c If you have labs (blood work) drawn today and your tests are completely normal, you will  receive your results only by: [place] (if you have MyChart) OR A paper copy in the mail If you have any lab test that is abnormal or we need to change your treatment, we will call you to review the results.   Testing/Procedures:  Not needed  Follow-Up: At [facility], you and your health needs are our priority.  As part of our continuing mission to provide you with exceptional heart care, we have created designated Provider Care Teams.  These Care Teams include your primary Cardiologist (physician) and Advanced Practice Providers (APPs -  Physician Assistants and Nurse Practitioners) who all work together to provide you with the care you need, when you need it.     Your next appointment:   6 month(s)  The format for your next appointment:   In Person  Provider:   Coletta Memos, FNP    Then, Glenetta Hew, MD will plan to see you again in 12 month(s).   Other Instructions   ADDENDUM: Lab Results  Component Value Date   CHOL 104 04/05/2022   HDL 41 04/05/2022   LDLCALC 42 04/05/2022   TRIG 113 04/05/2022   CHOLHDL 2.5 04/05/2022   Lab Results  Component Value Date   CREATININE 0.95 11/11/2020   BUN 14 11/11/2020   NA 139 11/11/2020   K 4.1 11/11/2020   CL 102 11/11/2020   CO2 24 11/11/2020   Lab Results  Component Value Date   HGBA1C 9.0 (H) 04/05/2022       [name], MD, MS Glenetta Hew, M.D., M.S. Interventional Cardiologist  [facility]  Pager # [phone] Phone # [phone] [address] [place], [place] [address]   Thank you for choosing [facility] [place]!!

## 2022-04-05 NOTE — Patient Instructions (Addendum)
Medication Instructions:    Check to see if you are taking  Atorvastatin or Lipitor - call office  to let us know  [phone] (all medication)   Stop taking Losartan 50 mg  Start taking losartan 100 mg - one tablet daily   *If you need a refill on your cardiac medications before your next appointment, please call your pharmacy*   Lab Work: Lipid Liver panel  hgba1c If you have labs (blood work) drawn today and your tests are completely normal, you will receive your results only by: MyChart Message (if you have MyChart) OR A paper copy in the mail If you have any lab test that is abnormal or we need to change your treatment, we will call you to review the results.   Testing/Procedures:  Not needed  Follow-Up: At [facility], you and your health needs are our priority.  As part of our continuing mission to provide you with exceptional heart care, we have created designated Provider Care Teams.  These Care Teams include your primary Cardiologist (physician) and Advanced Practice Providers (APPs -  Physician Assistants and Nurse Practitioners) who all work together to provide you with the care you need, when you need it.     Your next appointment:   6 month(s)  The format for your next appointment:   In Person  Provider:   Coletta Memos, FNP    Then, Glenetta Hew, MD will plan to see you again in 12 month(s).   Other Instructions

## 2022-04-06 ENCOUNTER — Other Ambulatory Visit: Payer: Self-pay | Admitting: Cardiology

## 2022-04-06 DIAGNOSIS — I25119 Atherosclerotic heart disease of native coronary artery with unspecified angina pectoris: Secondary | ICD-10-CM

## 2022-04-06 DIAGNOSIS — E785 Hyperlipidemia, unspecified: Secondary | ICD-10-CM

## 2022-04-06 DIAGNOSIS — I2111 ST elevation (STEMI) myocardial infarction involving right coronary artery: Secondary | ICD-10-CM

## 2022-04-06 LAB — HEMOGLOBIN A1C
Est. average glucose Bld gHb Est-mCnc: 212 mg/dL
Hgb A1c MFr Bld: 9 % — ABNORMAL HIGH (ref 4.8–5.6)

## 2022-04-06 LAB — HEPATIC FUNCTION PANEL
ALT: 30 IU/L (ref 0–44)
AST: 26 IU/L (ref 0–40)
Albumin: 4.7 g/dL (ref 3.8–4.9)
Alkaline Phosphatase: 86 IU/L (ref 44–121)
Bilirubin Total: 0.3 mg/dL (ref 0.0–1.2)
Bilirubin, Direct: 0.14 mg/dL (ref 0.00–0.40)
Total Protein: 7.2 g/dL (ref 6.0–8.5)

## 2022-04-06 LAB — LIPID PANEL
Chol/HDL Ratio: 2.5 ratio (ref 0.0–5.0)
Cholesterol, Total: 104 mg/dL (ref 100–199)
HDL: 41 mg/dL (ref >39)
LDL Chol Calc (NIH): 42 mg/dL (ref 0–99)
Triglycerides: 113 mg/dL (ref 0–149)
VLDL Cholesterol Cal: 21 mg/dL (ref 5–40)

## 2022-05-01 ENCOUNTER — Encounter: Payer: Self-pay | Admitting: Cardiology

## 2022-05-01 NOTE — Assessment & Plan Note (Signed)
Me to clarify which medication he is on-rosuvastatin or Lipitor/atorvastatin. Would like for him to be on rosuvastatin for 40 mg along with Zetia.  Also need to get the paperwork filled out for him to be back on Repatha.  But we probably have to have labs checked first.  Checking lipids and LFTs as well as A1c now.  Continue Farxiga and Ozempic with plans to probably titrate up Ozempic.Marland Kitchen  He is also on Vascepa..  For diabetes he is on glargine but not on short acting insulin.

## 2022-05-01 NOTE — Assessment & Plan Note (Addendum)
Inadequately controlled blood pressure.  Plan: Continue 10 mg of amlodipine and increase losartan to 100 mg daily.  Would probably try to start carvedilol at his next follow-up.

## 2022-05-01 NOTE — Assessment & Plan Note (Signed)
Distant history of MI at a young age.  Unfortunately, the fact that he has not had any residual symptoms or CHF has made the sense that he has been somewhat carefree and lackadaisical about his care management.  He has been 2 years since have seen him and has been off his medications.  We now have to figure out what is he is taking and what he is not taking.

## 2022-05-01 NOTE — Assessment & Plan Note (Addendum)
Doing well with no recurrent angina symptoms on high-dose amlodipine.  Anticipate beta-blocker once we have to get him back on meds and see how he does from and fatigue standpoint. Continue 10 mg amlodipine Increase losartan to 100 mg daily, pending blood pressure response, would probably consider beta-blocker such as carvedilol. Agree with Wilder Glade and Ozempic-anticipate titrating up Ozempic dose. Currently taking Zetia and atorvastatin but not tolerating as well.  Also needs to have his Repatha refilled. I would like to be on rosuvastatin as opposed to atorvastatin.  He is to call us and see which when he is actually taking. Will need to renew Repatha

## 2022-05-04 ENCOUNTER — Other Ambulatory Visit: Payer: Self-pay | Admitting: Pharmacist Clinician (PhC)/ Clinical Pharmacy Specialist

## 2022-05-04 DIAGNOSIS — I2111 ST elevation (STEMI) myocardial infarction involving right coronary artery: Secondary | ICD-10-CM

## 2022-05-04 DIAGNOSIS — E785 Hyperlipidemia, unspecified: Secondary | ICD-10-CM

## 2022-05-04 DIAGNOSIS — I25119 Atherosclerotic heart disease of native coronary artery with unspecified angina pectoris: Secondary | ICD-10-CM

## 2022-05-04 MED ORDER — REPATHA SURECLICK 140 MG/ML ~~LOC~~ SOAJ: SUBCUTANEOUS | 3 refills | Status: DC

## 2022-05-06 ENCOUNTER — Telehealth: Payer: Self-pay | Admitting: *Deleted

## 2022-05-06 NOTE — Telephone Encounter (Signed)
[name], RPH-CPP  [name], MD; [name], RN His PA for Repatha is good until 01/28/23.  George Dickson, I will send a prescription to his pharmacy if you can let him know.  [name] 05/04/22    Called  to inform patient - voicemail full , will try again .

## 2022-09-20 NOTE — Progress Notes (Unsigned)
Cardiology Clinic Note   Patient Name: George Dickson Date of Encounter: 09/21/2022  Primary Care Provider:  Leilani Able, MD Primary Cardiologist:  Bryan Lemma, MD  Patient Profile    George Dickson 52 year old male presents the clinic today for follow-up evaluation of his coronary artery disease and hyperlipidemia.  Past Medical History    Past Medical History:  Diagnosis Date   Anxiety    CAD S/P percutaneous coronary angioplasty 10/2011   Insetting of inferior STEMI. Proximal RCA PCI with Promus Element DES - 3.0 mm x 24 mm (3.35-3.2 tapered)   Dyslipidemia    GERD (gastroesophageal reflux disease)    occasionally takes TUMS   Headache(784.0)    Hypertension    Kidney stones    OSA (obstructive sleep apnea) 12/06/2011   initial sleep study  completed, CPAP /BiPAP TITRATION DONE 12/20/11   Pneumonia    ST elevation myocardial infarction (STEMI) of inferior wall (HCC) 10/23/2011   Proximal RCA occlusion treated with DES stent   Type II diabetes mellitus (HCC)    "dx'd today" (08/13/2013)   Past Surgical History:  Procedure Laterality Date   LEFT HEART CATHETERIZATION WITH CORONARY ANGIOGRAM N/A 10/23/2011   Procedure: LEFT HEART CATHETERIZATION WITH CORONARY ANGIOGRAM;  Surgeon: Marykay Lex, MD;  Location: [facility] CATH LAB;  Service: Cardiovascular;  Laterality: N/A;   LUMBAR LAMINECTOMY/DECOMPRESSION MICRODISCECTOMY Right 08/17/2013   Procedure: LUMBAR FOUR TO FIVE LUMBAR LAMINECTOMY/DECOMPRESSION MICRODISCECTOMY 1 LEVEL;  Surgeon: Temple Pacini, MD;  Location: [facility] NEURO ORS;  Service: Neurosurgery;  Laterality: Right;   PERCUTANEOUS CORONARY STENT INTERVENTION (PCI-S)  10/23/2011   Procedure: PERCUTANEOUS CORONARY STENT INTERVENTION (PCI-S) - With Aspiration Thrombectomy;  Surgeon: Marykay Lex, MD;  Location: [facility] C[facility] LAB;  Service: Cardiovascular;; PCI - proximal  RCA with  Promus ELEMENT  DES : 3.0 mm x 24mm ; taper post-dilation: 3.35-3.2 mm   SHOULDER ARTHROSCOPY Right  1992   TRANSTHORACIC ECHOCARDIOGRAM  10/25/2011   EF 60-65%;LV diastolic function normal ,systolic function normal    Allergies  Allergies  Allergen Reactions   Lisinopril Cough    History of Present Illness    George Dickson has a PMH of coronary artery disease, STEMI, type 2 diabetes, hyperlipidemia, obesity, and HTN.  He had a distant MI at young age.  Underwent cardiac catheterization 10/23/2011 with PCI and stenting to his RCA.  An echocardiogram 10/25/2011 showed EF of 60 to 65%, no wall motion abnormalities.  No evidence of valvular disease.  He followed up with Dr. Herbie Baltimore on 04/05/2022.  He had been off of his medications for quite some time.  He had not had any significant chest pain or pressure.  He did note rare pinprick type pain.  He had gained weight and lost weight.  He had also lost touch with his PCP.  The importance of managing his blood sugar was reiterated.  He noted myalgia with ezetimibe and Lipitor.  He was approved for Repatha.  He was taking Ozempic and noting good results.  He presents to the clinic today for follow-up evaluation and states he has been waking up frequently at night and is short of breath.  He was previously told that he had sleep apnea.  He reports that this was around 11 years ago.  He has not been happy with his PCP and reports that he frequently runs out of medication because refills are not provided.  His blood pressure initially in the clinic today is 140/74 and on  recheck is 132/86.  We reviewed his medications and catheterization.  He expressed understanding.  I will order a split-night sleep study, CBC, BMP, and provide list for PCP.  Will plan follow-up in 6 months.  Today he denies chest pain, shortness of breath, lower extremity edema, fatigue, palpitations, melena, hematuria, hemoptysis, diaphoresis, weakness, presyncope, syncope, orthopnea, and PND.    Home Medications    Prior to Admission medications   Medication Sig Start Date End Date  Taking? Authorizing Provider  albuterol (VENTOLIN HFA) 108 (90 Base) MCG/ACT inhaler Inhale 1-2 puffs into the lungs every 6 (six) hours as needed for wheezing or shortness of breath. 03/03/20   Wieters, Hallie C, PA-C  amLODipine (NORVASC) 10 MG tablet Take 1 tablet by mouth daily. 05/21/20   [provider]  aspirin EC 81 MG tablet Take 1 tablet (81 mg total) by mouth daily. 02/03/17   Marykay Lex, MD  atorvastatin (LIPITOR) 40 MG tablet Take 40 mg by mouth daily.    [provider]  Continuous Blood Gluc Receiver (FREESTYLE LIBRE 2 READER) DEVI 1 Device by Does not apply route as directed. Patient not taking: Reported on 04/05/2022 02/22/20   Shamleffer, Konrad Dolores, MD  Continuous Blood Gluc Sensor (FREESTYLE LIBRE 2 SENSOR) MISC 1 Device by Does not apply route as directed. Patient not taking: Reported on 04/05/2022 02/22/20   Shamleffer, Konrad Dolores, MD  Evolocumab (REPATHA SURECLICK) 140 MG/ML SOAJ INJECT 140MG  INTO THE SKIN EVERY 14 DAYS 05/04/22   Marykay Lex, MD  ezetimibe (ZETIA) 10 MG tablet Take 1 tablet (10 mg total) by mouth daily. 05/21/20 08/19/20  Marykay Lex, MD  FARXIGA 10 MG TABS tablet Take 10 mg by mouth daily. 09/08/19   [provider]  HUMALOG KWIKPEN 100 UNIT/ML KwikPen Inject into the skin. 10/16/19   [provider]  icosapent Ethyl (VASCEPA) 1 g capsule Take 2 capsules (2 g total) by mouth 2 (two) times daily. 12/11/19   Marykay Lex, MD  LANTUS SOLOSTAR 100 UNIT/ML Solostar Pen SMARTSIG:80 Unit(s) SUB-Q Every Evening 01/30/20   [provider]  losartan (COZAAR) 100 MG tablet Take 1 tablet (100 mg total) by mouth daily. 04/05/22   Marykay Lex, MD  nitroGLYCERIN (NITROSTAT) 0.4 MG SL tablet Place 1 tablet (0.4 mg total) under the tongue every 5 (five) minutes as needed for chest pain. 04/05/22 07/04/22  Marykay Lex, MD  ondansetron (ZOFRAN ODT) 4 MG disintegrating tablet Take 1 tablet (4 mg total)  by mouth every 8 (eight) hours as needed for nausea or vomiting. 11/11/20   Wieters, Hallie C, PA-C  OZEMPIC, 0.25 OR 0.5 MG/DOSE, 2 MG/3ML SOPN SMARTSIG:0.25 Milligram(s) SUB-Q Once a Week 02/22/22   [provider]    Family History    Family History  Problem Relation Age of Onset   Coronary artery disease Mother    Hypertension Mother    Diabetes type II Mother    He indicated that his mother is alive. He indicated that his father is alive.  Social History    Social History   Socioeconomic History   Marital status: Divorced    Spouse name: Not on file   Number of children: Not on file   Years of education: Not on file   Highest education level: Not on file  Occupational History   Not on file  Tobacco Use   Smoking status: Every Day    Packs/day: 0.50    Years: 4.00  Additional pack years: 0.00    Total pack years: 2.00    Types: Cigars, Cigarettes   Smokeless tobacco: Never  Substance and Sexual Activity   Alcohol use: Yes   Drug use: No    Comment: in the past   Sexual activity: Yes  Other Topics Concern   Not on file  Social History Narrative   Not on file   Social Determinants of Health   Financial Resource Strain: Not on file  Food Insecurity: Not on file  Transportation Needs: Not on file  Physical Activity: Not on file  Stress: Not on file  Social Connections: Not on file  Intimate Partner Violence: Not on file     Review of Systems    General:  No chills, fever, night sweats or weight changes.  Cardiovascular:  No chest pain, dyspnea on exertion, edema, orthopnea, palpitations, paroxysmal nocturnal dyspnea. Dermatological: No rash, lesions/masses Respiratory: No cough, dyspnea Urologic: No hematuria, dysuria Abdominal:   No nausea, vomiting, diarrhea, bright red blood per rectum, melena, or hematemesis Neurologic:  No visual changes, wkns, changes in mental status. All other systems reviewed and are otherwise negative except as noted  above.  Physical Exam    VS:  BP 132/86   Pulse 84   Ht 6\' 3"  (1.905 m)   Wt 298 lb (135.2 kg)   SpO2 94%   BMI 37.25 kg/m  , BMI Body mass index is 37.25 kg/m. GEN: Well nourished, well developed, in no acute distress. HEENT: normal. Neck: Supple, no JVD, carotid bruits, or masses. Cardiac: RRR, no murmurs, rubs, or gallops. No clubbing, cyanosis, edema.  Radials/DP/PT 2+ and equal bilaterally.  Respiratory:  Respirations regular and unlabored, clear to auscultation bilaterally. GI: Soft, nontender, nondistended, BS + x 4. MS: no deformity or atrophy. Skin: warm and dry, no rash. Neuro:  Strength and sensation are intact. Psych: Normal affect.  Accessory Clinical Findings    Recent Labs: 04/05/2022: ALT 30   Recent Lipid Panel    Component Value Date/Time   CHOL 104 04/05/2022 1126   TRIG 113 04/05/2022 1126   HDL 41 04/05/2022 1126   CHOLHDL 2.5 04/05/2022 1126   CHOLHDL 12.0 08/14/2013 0156   VLDL UNABLE TO CALCULATE IF TRIGLYCERIDE OVER 400 mg/dL 16/02/9603 5409   LDLCALC 42 04/05/2022 1126         ECG personally reviewed by me today-none today.  Echocardiogram 10/25/2011  Study Conclusions   - Left ventricle: The cavity size was normal. Wall thickness    was increased. There was hypertrophy, with an appearance    suggesting concentric remodeling (increased wall thickness    with normal wall mass). Systolic function was normal. The    estimated ejection fraction was in the range of 60% to    65%. Wall motion was normal; there were no regional wall    motion abnormalities. Left ventricular diastolic function    parameters were normal.  - Atrial septum: There was increased thickness of the    septum, consistent with lipomatous hypertrophy.  Impressions:   - Normal study. No evidence of valve disease.  Transthoracic echocardiography.  M-mode, complete 2D,  spectral Doppler, and color Doppler.  Height:  Height:  190.5cm. Height: 75in.  Weight:  Weight:  135.6kg. Weight:  298.4lb.  Body mass index:  BMI: 37.4kg/m^2.  Body surface  area:    BSA: 2.62m^2.  Blood pressure:     121/75.  Patient  status:  Inpatient.  Location:  Bedside.   ------------------------------------------------------------   ------------------------------------------------------------  Left ventricle:  The cavity size was normal. Wall thickness  was increased. There was hypertrophy, with an appearance  suggesting concentric remodeling (increased wall thickness  with normal wall mass). Systolic function was normal. The  estimated ejection fraction was in the range of 60% to 65%.  Wall motion was normal; there were no regional wall motion  abnormalities. The transmitral flow pattern was normal. The  deceleration time of the early transmitral flow velocity was  normal. The pulmonary vein flow pattern was normal. The  tissue Doppler parameters were normal. Left ventricular  diastolic function parameters were normal. There was no  evidence of elevated ventricular filling pressure by Doppler  parameters.   ------------------------------------------------------------  Aortic valve:   Trileaflet; normal thickness leaflets.  Mobility was not restricted.  Doppler:  Transvalvular  velocity was within the normal range. There was no stenosis.   No regurgitation.   ------------------------------------------------------------  Aorta: Aortic root: The aortic root was normal in size.  Ascending aorta: The ascending aorta was normal in size.   ------------------------------------------------------------  Mitral valve:   Normal-sized annulus. Structurally normal  valve. Normal thickness leaflets . Leaflet separation was  normal. Mobility was not restricted. No echocardiographic  evidence for prolapse.  Doppler:  Transvalvular velocity was  within the normal range. There was no evidence for stenosis.   No regurgitation.    Peak gradient: 3mm Hg (D).    ------------------------------------------------------------  Left atrium:  The atrium was normal in size.   ------------------------------------------------------------  Atrial septum:  There was increased thickness of the septum,  consistent with lipomatous hypertrophy.   ------------------------------------------------------------  Right ventricle:  The cavity size was normal. Wall thickness  was normal. Systolic function was normal.   ------------------------------------------------------------  Pulmonic valve:   Poorly visualized.  The valve appears to  be grossly normal.    Doppler:  Transvalvular velocity was  within the normal range. There was no evidence for stenosis.   No significant regurgitation.   ------------------------------------------------------------  Tricuspid valve:   Structurally normal valve.    Doppler:  Transvalvular velocity was within the normal range.  No  regurgitation.   ------------------------------------------------------------  Pulmonary artery:   The main pulmonary artery was  normal-sized.  Systolic pressure could not be accurately  estimated.   ------------------------------------------------------------  Right atrium:  The atrium was normal in size.   ------------------------------------------------------------  Pericardium: There was no pericardial effusion.   ------------------------------------------------------------  Systemic veins:  Inferior vena cava: The vessel was normal in size; the  respirophasic diameter changes were in the normal range (=  50%); findings are consistent with normal central venous  pressure.    Assessment & Plan   1.  Coronary artery disease-  No chest pain today.  Denies recent episodes of arm neck back or chest discomfort.  Intermittently exercises. Continue current medical therapy Heart healthy low-sodium diet Increase physical activity as tolerated- Bmp, CBC  Hyperlipidemia-LDL 107 on 02/03/17.   Reports compliance with Repatha. Follows with pharmacy lipid clinic High-fiber diet  Essential hypertension-BP today 132/86.  Does not avoid salt in his diet. Maintain blood pressure log Low-sodium diet-salty 6 given Continue losartan, amlodipine  Type 2 diabetes-glucose 273 on 11/11/20. Continue Farxiga, Ozempic Increase physical activity as tolerated  Obesity-on Ozempic Follows with PCP   Daytime somnolence- Stop bang 8. Epworth score 15. Periods of apnea with napping. Order splitnight sleep study  Disposition: Follow-up with Dr. Herbie Baltimore or me in 6 months.   Thomasene Ripple. Daegen Berrocal NP-C     09/21/2022, 10:58 AM  [facility] [address] Office [phone] Fax [phone]    I spent 14 minutes examining this patient, reviewing medications, and using patient centered shared decision making involving her cardiac care.  Prior to her visit I spent greater than 20 minutes reviewing her past medical history,  medications, and prior cardiac tests.

## 2022-09-21 ENCOUNTER — Encounter: Payer: Self-pay | Admitting: General Practice

## 2022-09-21 ENCOUNTER — Ambulatory Visit: Payer: 59 | Attending: General Practice | Admitting: General Practice

## 2022-09-21 VITALS — BP 132/86 | HR 84 | Ht 75.0 in | Wt 298.0 lb

## 2022-09-21 DIAGNOSIS — E1169 Type 2 diabetes mellitus with other specified complication: Secondary | ICD-10-CM

## 2022-09-21 DIAGNOSIS — I10 Essential (primary) hypertension: Secondary | ICD-10-CM

## 2022-09-21 DIAGNOSIS — I25119 Atherosclerotic heart disease of native coronary artery with unspecified angina pectoris: Secondary | ICD-10-CM

## 2022-09-21 DIAGNOSIS — E669 Obesity, unspecified: Secondary | ICD-10-CM

## 2022-09-21 DIAGNOSIS — E785 Hyperlipidemia, unspecified: Secondary | ICD-10-CM

## 2022-09-21 DIAGNOSIS — E118 Type 2 diabetes mellitus with unspecified complications: Secondary | ICD-10-CM | POA: Diagnosis not present

## 2022-09-21 DIAGNOSIS — R400 Somnolence: Secondary | ICD-10-CM

## 2022-09-21 DIAGNOSIS — Z794 Long term (current) use of insulin: Secondary | ICD-10-CM

## 2022-09-21 LAB — CBC

## 2022-09-21 NOTE — Patient Instructions (Signed)
Medication Instructions:  The current medical regimen is effective;  continue present plan and medications as directed. Please refer to the Current Medication list given to you today.  *If you need a refill on your cardiac medications before your next appointment, please call your pharmacy*  Lab Work: BMET AND CBC TODAY If you have labs (blood work) drawn today and your tests are completely normal, you will receive your results only by: MyChart Message (if you have MyChart) OR A paper copy in the mail If you have any lab test that is abnormal or we need to change your treatment, we will call you to review the results.  Testing/Procedures: Your physician has recommended that you have a sleep study. This test records several body functions during sleep, including: brain activity, eye movement, oxygen and carbon dioxide blood levels, heart rate and rhythm, breathing rate and rhythm, the flow of air through your mouth and nose, snoring, body muscle movements, and chest and belly movement.   Follow-Up: At [facility], you and your health needs are our priority.  As part of our continuing mission to provide you with exceptional heart care, we have created designated Provider Care Teams.  These Care Teams include your primary Cardiologist (physician) and Advanced Practice Providers (APPs -  Physician Assistants and Nurse Practitioners) who all work together to provide you with the care you need, when you need it.  Your next appointment:   6 month(s)  Provider:   Bryan Lemma, MD     Other Instructions  PLEASE READ AND FOLLOW ATTACHED  SALTY 6

## 2022-09-22 LAB — CBC
Hematocrit: 51.3 % — ABNORMAL HIGH (ref 37.5–51.0)
Hemoglobin: 17 g/dL (ref 13.0–17.7)
MCH: 27.2 pg (ref 26.6–33.0)
MCHC: 33.1 g/dL (ref 31.5–35.7)
MCV: 82 fL (ref 79–97)
Platelets: 189 10*3/uL (ref 150–450)
RBC: 6.24 x10E6/uL — ABNORMAL HIGH (ref 4.14–5.80)
RDW: 14 % (ref 11.6–15.4)
WBC: 8.1 10*3/uL (ref 3.4–10.8)

## 2022-09-22 LAB — BASIC METABOLIC PANEL
BUN/Creatinine Ratio: 12 (ref 9–20)
BUN: 10 mg/dL (ref 6–24)
CO2: 21 mmol/L (ref 20–29)
Calcium: 9.5 mg/dL (ref 8.7–10.2)
Chloride: 105 mmol/L (ref 96–106)
Creatinine, Ser: 0.83 mg/dL (ref 0.76–1.27)
Glucose: 80 mg/dL (ref 70–99)
Potassium: 4.2 mmol/L (ref 3.5–5.2)
Sodium: 141 mmol/L (ref 134–144)
eGFR: 106 mL/min/{1.73_m2} (ref >59)

## 2022-11-16 NOTE — Addendum Note (Signed)
Addended by: Brunetta Genera on: 11/16/2022 10:25 AM   Modules accepted: Orders

## 2022-12-15 ENCOUNTER — Telehealth: Payer: Self-pay

## 2022-12-15 ENCOUNTER — Encounter: Payer: Self-pay | Admitting: Urology

## 2022-12-15 ENCOUNTER — Ambulatory Visit
Admission: EM | Admit: 2022-12-15 | Discharge: 2022-12-15 | Disposition: A | Discharge status: Home / Self Care | Payer: 59 | Attending: Physician Assistant | Admitting: Physician Assistant

## 2022-12-15 ENCOUNTER — Ambulatory Visit: Payer: 59 | Admitting: Urology

## 2022-12-15 VITALS — BP 145/85 | HR 73 | Ht 75.0 in | Wt 290.0 lb

## 2022-12-15 DIAGNOSIS — G8929 Other chronic pain: Secondary | ICD-10-CM

## 2022-12-15 DIAGNOSIS — M545 Low back pain, unspecified: Secondary | ICD-10-CM

## 2022-12-15 DIAGNOSIS — M7989 Other specified soft tissue disorders: Secondary | ICD-10-CM | POA: Diagnosis not present

## 2022-12-15 DIAGNOSIS — N200 Calculus of kidney: Secondary | ICD-10-CM | POA: Diagnosis not present

## 2022-12-15 LAB — URINALYSIS, ROUTINE W REFLEX MICROSCOPIC
Bilirubin, UA: NEGATIVE
Ketones, UA: NEGATIVE
Leukocytes,UA: NEGATIVE
Nitrite, UA: NEGATIVE
Protein,UA: NEGATIVE
RBC, UA: NEGATIVE
Specific Gravity, UA: 1.015 (ref 1.005–1.030)
Urobilinogen, Ur: 0.2 mg/dL (ref 0.2–1.0)
pH, UA: 6 (ref 5.0–7.5)

## 2022-12-15 LAB — MICROSCOPIC EXAMINATION
Bacteria, UA: NONE SEEN
RBC, Urine: NONE SEEN /hpf (ref 0–2)

## 2022-12-15 MED ORDER — DOXYCYCLINE HYCLATE 100 MG PO CAPS: 100.0000 mg | ORAL_CAPSULE | Freq: Two times a day (BID) | ORAL | 0 refills | Status: AC

## 2022-12-15 MED ORDER — DOXYCYCLINE HYCLATE 100 MG PO CAPS: 100.0000 mg | ORAL_CAPSULE | Freq: Two times a day (BID) | ORAL | 0 refills | Status: DC

## 2022-12-15 NOTE — ED Triage Notes (Signed)
"  I have an abscess or cyst on my left thumb". No injury known. No fever.

## 2022-12-15 NOTE — ED Provider Notes (Signed)
[facility]    CSN: [id number] Arrival date & time: 12/15/22  1341      History   Chief Complaint Chief Complaint  Patient presents with   Skin Problem    HPI George Dickson is a 52 y.o. male.   Patient here today for evaluation of area of swelling to his left dorsal thumb that he is concerned could be abscess or cyst.  He has not any injury.  He denies any fevers.  He has not any drainage from the area.  He states that there has been some swelling there for some time but recently has gotten larger in size.  The history is provided by the patient.    Past Medical History:  Diagnosis Date   Anxiety    CAD S/P percutaneous coronary angioplasty 10/2011   Insetting of inferior STEMI. Proximal RCA PCI with Promus Element DES - 3.0 mm x 24 mm (3.35-3.2 tapered)   Dyslipidemia    GERD (gastroesophageal reflux disease)    occasionally takes TUMS   Headache(784.0)    Hypertension    Kidney stones    OSA (obstructive sleep apnea) 12/06/2011   initial sleep study  completed, CPAP /BiPAP TITRATION DONE 12/20/11   Pneumonia    ST elevation myocardial infarction (STEMI) of inferior wall (HCC) 10/23/2011   Proximal RCA occlusion treated with DES stent   Type II diabetes mellitus (HCC)    "dx'd today" (08/13/2013)    Patient Active Problem List   Diagnosis Date Noted   Chronic bilateral low back pain without sciatica 12/15/2022   Type 2 diabetes mellitus with hyperglycemia, with long-term current use of insulin (HCC) 02/22/2020   Hyperlipidemia associated with type 2 diabetes mellitus (HCC) 02/03/2017   Nephrolithiasis 01/07/2017   HNP (herniated nucleus pulposus), lumbar 08/17/2013   Lumbar disc herniation with radiculopathy 08/17/2013   Type 2 diabetes mellitus with complication, with long-term current use of insulin (HCC) 08/15/2013   Essential hypertension 08/13/2013   Presence of drug coated stent in right coronary artery - Promus 3.0 mm x 24 mm - post dilated to  3.3 mm  10/24/2011    Class: Acute   Tobacco use 10/23/2011   ST elevation myocardial infarction (STEMI) involving right coronary artery without development of Q waves (HCC) 10/23/2011   Obesity, Class II, BMI 35-39.9 10/23/2011   Coronary artery disease involving native coronary artery of native heart with angina pectoris (HCC) 10/23/2011    Past Surgical History:  Procedure Laterality Date   LEFT HEART CATHETERIZATION WITH CORONARY ANGIOGRAM N/A 10/23/2011   Procedure: LEFT HEART CATHETERIZATION WITH CORONARY ANGIOGRAM;  Surgeon: Marykay Lex, MD;  Location: [facility] CATH LAB;  Service: Cardiovascular;  Laterality: N/A;   LUMBAR LAMINECTOMY/DECOMPRESSION MICRODISCECTOMY Right 08/17/2013   Procedure: LUMBAR FOUR TO FIVE LUMBAR LAMINECTOMY/DECOMPRESSION MICRODISCECTOMY 1 LEVEL;  Surgeon: Temple Pacini, MD;  Location: [facility] NEURO ORS;  Service: Neurosurgery;  Laterality: Right;   PERCUTANEOUS CORONARY STENT INTERVENTION (PCI-S)  10/23/2011   Procedure: PERCUTANEOUS CORONARY STENT INTERVENTION (PCI-S) - With Aspiration Thrombectomy;  Surgeon: Marykay Lex, MD;  Location: [facility] CATH LAB;  Service: Cardiovascular;; PCI - proximal  RCA with  Promus ELEMENT  DES : 3.0 mm x 24mm ; taper post-dilation: 3.35-3.2 mm   SHOULDER ARTHROSCOPY Right 1992   TRANSTHORACIC ECHOCARDIOGRAM  10/25/2011   EF 60-65%;LV diastolic function normal ,systolic function normal       Home Medications    Prior to Admission medications   Medication Sig Start Date End  Date Taking? Authorizing Provider  amLODipine (NORVASC) 10 MG tablet Take 1 tablet by mouth daily. 05/21/20  Yes [provider]  aspirin EC 81 MG tablet Take 1 tablet (81 mg total) by mouth daily. 02/03/17  Yes Marykay Lex, MD  atorvastatin (LIPITOR) 40 MG tablet Take 40 mg by mouth daily.   Yes [provider]  Continuous Blood Gluc Receiver (FREESTYLE LIBRE 2 READER) DEVI 1 Device by Does not apply route as directed. 02/22/20  Yes Shamleffer,  Konrad Dolores, MD  Continuous Blood Gluc Sensor (FREESTYLE LIBRE 2 SENSOR) MISC 1 Device by Does not apply route as directed. 02/22/20  Yes Shamleffer, Konrad Dolores, MD  Evolocumab (REPATHA SURECLICK) 140 MG/ML SOAJ INJECT 140MG  INTO THE SKIN EVERY 14 DAYS 05/04/22  Yes Marykay Lex, MD  FARXIGA 10 MG TABS tablet Take 10 mg by mouth daily. 09/08/19  Yes [provider]  LANTUS SOLOSTAR 100 UNIT/ML Solostar Pen SMARTSIG:80 Unit(s) SUB-Q Every Evening 01/30/20  Yes [provider]  losartan (COZAAR) 100 MG tablet Take 1 tablet (100 mg total) by mouth daily. 04/05/22  Yes Marykay Lex, MD  OZEMPIC, 0.25 OR 0.5 MG/DOSE, 2 MG/3ML SOPN SMARTSIG:0.25 Milligram(s) SUB-Q Once a Week 02/22/22  Yes [provider]  albuterol (VENTOLIN HFA) 108 (90 Base) MCG/ACT inhaler Inhale 1-2 puffs into the lungs every 6 (six) hours as needed for wheezing or shortness of breath. 03/03/20   Wieters, Hallie C, PA-C  doxycycline (VIBRAMYCIN) 100 MG capsule Take 1 capsule (100 mg total) by mouth 2 (two) times daily for 7 days. 12/15/22 12/22/22  Merrilee Jansky, MD  ezetimibe (ZETIA) 10 MG tablet Take 1 tablet (10 mg total) by mouth daily. 05/21/20 08/19/20  Marykay Lex, MD  HUMALOG KWIKPEN 100 UNIT/ML KwikPen Inject into the skin. 10/16/19   [provider]  ibuprofen (ADVIL) 600 MG tablet Take 600 mg by mouth every 6 (six) hours as needed for mild pain. 09/08/22   [provider]  icosapent Ethyl (VASCEPA) 1 g capsule Take 2 capsules (2 g total) by mouth 2 (two) times daily. 12/11/19   Marykay Lex, MD  nitroGLYCERIN (NITROSTAT) 0.4 MG SL tablet Place 1 tablet (0.4 mg total) under the tongue every 5 (five) minutes as needed for chest pain. 04/05/22 07/04/22  Marykay Lex, MD  ondansetron (ZOFRAN ODT) 4 MG disintegrating tablet Take 1 tablet (4 mg total) by mouth every 8 (eight) hours as needed for nausea or vomiting. 11/11/20   Wieters, Hallie C, PA-C  tiZANidine  (ZANAFLEX) 4 MG tablet Take 4 mg by mouth at bedtime. 09/17/22   [provider]    Family History Family History  Problem Relation Age of Onset   Coronary artery disease Mother    Hypertension Mother    Diabetes type II Mother     Social History Social History   Tobacco Use   Smoking status: Every Day    Current packs/day: 0.50    Average packs/day: 0.5 packs/day for 4.0 years (2.0 ttl pk-yrs)    Types: Cigars, Cigarettes   Smokeless tobacco: Never  Vaping Use   Vaping status: Never Used  Substance Use Topics   Alcohol use: Yes   Drug use: No    Comment: in the past     Allergies   Lisinopril   Review of Systems Review of Systems  Constitutional:  Negative for chills and fever.  Eyes:  Negative for discharge and redness.  Musculoskeletal:  Negative for arthralgias.  Skin:  Negative for color change and wound.  Neurological:  Negative for numbness.     Physical Exam Triage Vital Signs ED Triage Vitals  Encounter Vitals Group     BP 12/15/22 1408 132/77     Systolic BP Percentile --      Diastolic BP Percentile --      Pulse Rate 12/15/22 1408 79     Resp 12/15/22 1408 18     Temp 12/15/22 1408 97.9 F (36.6 C)     Temp Source 12/15/22 1408 Oral     SpO2 12/15/22 1408 96 %     Weight 12/15/22 1404 290 lb (131.5 kg)     Height 12/15/22 1404 6\' 3"  (1.905 m)     Head Circumference --      Peak Flow --      Pain Score 12/15/22 1404 8     Pain Loc --      Pain Education --      Exclude from Growth Chart --    No data found.  Updated Vital Signs BP 132/77 (BP Location: Left Arm)   Pulse 79   Temp 97.9 F (36.6 C) (Oral)   Resp 18   Ht 6\' 3"  (1.905 m)   Wt 290 lb (131.5 kg)   SpO2 96%   BMI 36.25 kg/m      Physical Exam Vitals and nursing note reviewed.  Constitutional:      General: He is not in acute distress.    Appearance: Normal appearance. He is not ill-appearing.  HENT:     Head: Normocephalic and atraumatic.  Eyes:      Conjunctiva/sclera: Conjunctivae normal.  Cardiovascular:     Rate and Rhythm: Normal rate.  Pulmonary:     Effort: Pulmonary effort is normal.  Skin:    Comments: Approx 1 cm area of swelling to left dorsal thumb just proximal to nail, no wound or erythema, drainage noted.   Neurological:     Mental Status: He is alert.  Psychiatric:        Mood and Affect: Mood normal.        Behavior: Behavior normal.        Thought Content: Thought content normal.      UC Treatments / Results  Labs (all labs ordered are listed, but only abnormal results are displayed) Labs Reviewed - No data to display  EKG   Radiology No results found.  Procedures Procedures (including critical care time)  Medications Ordered in UC Medications - No data to display  Initial Impression / Assessment and Plan / UC Course  I have reviewed the triage vital signs and the nursing notes.  Pertinent labs & imaging results that were available during my care of the patient were reviewed by me and considered in my medical decision making (see chart for details).    Will treat with doxycycline to cover possible infectious cause of swelling but advised may need referral to gen surg for removal surgically if no improvement- advised patient to follow up with PCP. Patient expresses understanding.   Final Clinical Impressions(s) / UC Diagnoses   Final diagnoses:  Swelling of left thumb     Discharge Instructions      Please follow up with primary care if no improvement or with any further concerns.    ED Prescriptions     Medication Sig Dispense Auth. Provider   doxycycline (VIBRAMYCIN) 100 MG capsule Take 1 capsule (100 mg total) by mouth 2 (two) times  daily for 7 days. 14 capsule Tomi Bamberger, PA-C      PDMP not reviewed this encounter.   Tomi Bamberger, PA-C 12/15/22 1458

## 2022-12-15 NOTE — Telephone Encounter (Signed)
Sent to pharmacy per Provider.  B. Roten CMA

## 2022-12-15 NOTE — Discharge Instructions (Signed)
Please follow up with primary care if no improvement or with any further concerns.

## 2022-12-15 NOTE — Progress Notes (Signed)
Assessment: 1. Nephrolithiasis   2. Chronic bilateral low back pain without sciatica     Plan: I personally reviewed the patient's chart including provider notes, and imaging results. He does have a history of left, possibly right renal calculi.  He has not had any recent imaging. I am not sure that his back pain is related to kidney stones. Recommend further evaluation with a CT stone study for further evaluation. Will contact him with results.   Chief Complaint:  Chief Complaint  Patient presents with   Nephrolithiasis    History of Present Illness:  George Dickson is a 52 y.o. male who is seen for evaluation of bilateral low back pain and history of nephrolithiasis.  He reports a history of nephrolithiasis undergoing treatment with shockwave lithotripsy >10 years ago.  He reports that he has had ongoing bilateral low back pain for a number of years.  He reports pain in the lower lumbar area bilaterally, this is increased with sitting.  Also notices some increase with certain movements.  CT imaging from 8/18 showed two 6 mm left lower pole calculi without evidence of obstruction. Abdominal films from 6/22 showed 2 adjacent stones in the inferior pole of the left kidney, and a possible calcific patient in the right renal shadow.  He reports urinary frequency and nocturia.  No dysuria or gross hematuria. IPSS = 14 today.  Past Medical History:  Past Medical History:  Diagnosis Date   Anxiety    CAD S/P percutaneous coronary angioplasty 10/2011   Insetting of inferior STEMI. Proximal RCA PCI with Promus Element DES - 3.0 mm x 24 mm (3.35-3.2 tapered)   Dyslipidemia    GERD (gastroesophageal reflux disease)    occasionally takes TUMS   Headache(784.0)    Hypertension    Kidney stones    OSA (obstructive sleep apnea) 12/06/2011   initial sleep study  completed, CPAP /BiPAP TITRATION DONE 12/20/11   Pneumonia    ST elevation myocardial infarction (STEMI) of inferior wall (HCC)  10/23/2011   Proximal RCA occlusion treated with DES stent   Type II diabetes mellitus (HCC)    "dx'd today" (08/13/2013)    Past Surgical History:  Past Surgical History:  Procedure Laterality Date   LEFT HEART CATHETERIZATION WITH CORONARY ANGIOGRAM N/A 10/23/2011   Procedure: LEFT HEART CATHETERIZATION WITH CORONARY ANGIOGRAM;  Surgeon: Marykay Lex, MD;  Location: [facility] CATH LAB;  Service: Cardiovascular;  Laterality: N/A;   LUMBAR LAMINECTOMY/DECOMPRESSION MICRODISCECTOMY Right 08/17/2013   Procedure: LUMBAR FOUR TO FIVE LUMBAR LAMINECTOMY/DECOMPRESSION MICRODISCECTOMY 1 LEVEL;  Surgeon: Temple Pacini, MD;  Location: [facility] NEURO ORS;  Service: Neurosurgery;  Laterality: Right;   PERCUTANEOUS CORONARY STENT INTERVENTION (PCI-S)  10/23/2011   Procedure: PERCUTANEOUS CORONARY STENT INTERVENTION (PCI-S) - With Aspiration Thrombectomy;  Surgeon: Marykay Lex, MD;  Location: [facility] CATH LAB;  Service: Cardiovascular;; PCI - proximal  RCA with  Promus ELEMENT  DES : 3.0 mm x 24mm ; taper post-dilation: 3.35-3.2 mm   SHOULDER ARTHROSCOPY Right 1992   TRANSTHORACIC ECHOCARDIOGRAM  10/25/2011   EF 60-65%;LV diastolic function normal ,systolic function normal    Allergies:  Allergies  Allergen Reactions   Lisinopril Cough    Family History:  Family History  Problem Relation Age of Onset   Coronary artery disease Mother    Hypertension Mother    Diabetes type II Mother     Social History:  Social History   Tobacco Use   Smoking status: Every Day    Current  packs/day: 0.50    Average packs/day: 0.5 packs/day for 4.0 years (2.0 ttl pk-yrs)    Types: Cigars, Cigarettes   Smokeless tobacco: Never  Substance Use Topics   Alcohol use: Yes   Drug use: No    Comment: in the past    Review of symptoms:  Constitutional:  Negative for unexplained weight loss, night sweats, fever, chills ENT:  Negative for nose bleeds, sinus pain, painful swallowing CV:  Negative for chest pain, shortness of  breath, exercise intolerance, palpitations, loss of consciousness Resp:  Negative for cough, wheezing, shortness of breath GI:  Negative for nausea, vomiting, diarrhea, bloody stools GU:  Positives noted in HPI; otherwise negative for gross hematuria, dysuria, urinary incontinence Neuro:  Negative for seizures, poor balance, limb weakness, slurred speech Psych:  Negative for lack of energy, depression, anxiety Endocrine:  Negative for polydipsia, polyuria, symptoms of hypoglycemia (dizziness, hunger, sweating) Hematologic:  Negative for anemia, purpura, petechia, prolonged or excessive bleeding, use of anticoagulants  Allergic:  Negative for difficulty breathing or choking as a result of exposure to anything; no shellfish allergy; no allergic response (rash/itch) to materials, foods  Physical exam: BP (!) 145/85   Pulse 73   Ht 6\' 3"  (1.905 m)   Wt 290 lb (131.5 kg)   BMI 36.25 kg/m  GENERAL APPEARANCE:  Well appearing, well developed, well nourished, NAD HEENT: Atraumatic, Normocephalic, oropharynx clear. NECK: Supple without lymphadenopathy or thyromegaly. LUNGS: Clear to auscultation bilaterally. HEART: Regular Rate and Rhythm without murmurs, gallops, or rubs. ABDOMEN: Soft, non-tender, No Masses. EXTREMITIES: Moves all extremities well.  Without clubbing, cyanosis, or edema. NEUROLOGIC:  Alert and oriented x 3, normal gait, CN II-XII grossly intact.  MENTAL STATUS:  Appropriate. BACK:  Tender to palpation in paraspinal area of bilateral low back.  No CVAT SKIN:  Warm, dry and intact.    Results: U/A: 0-5 WBC, 0 RBC

## 2022-12-16 ENCOUNTER — Telehealth (HOSPITAL_BASED_OUTPATIENT_CLINIC_OR_DEPARTMENT_OTHER): Payer: Self-pay

## 2022-12-17 ENCOUNTER — Ambulatory Visit (HOSPITAL_BASED_OUTPATIENT_CLINIC_OR_DEPARTMENT_OTHER): Payer: 59

## 2022-12-28 ENCOUNTER — Telehealth: Payer: Self-pay

## 2022-12-28 DIAGNOSIS — R400 Somnolence: Secondary | ICD-10-CM

## 2022-12-28 NOTE — Telephone Encounter (Signed)
**Note De-Identified Evan Mackie Obfuscation** I started a Split Night Sleep Study PA through the [facility] Portal:  Status: PENDED  Reason: 1.Disposition pending review prior authorization reference number [id number]

## 2022-12-30 NOTE — Telephone Encounter (Signed)
**Note De-Identified George Dickson Obfuscation** Per [facility] with [employer], there are 3 open prior authorization claims for the pts Split Night Sleep study and he needs verification for the correct one.  I advised him that my prior authorization reference number for the pts Split Night sleep study MW:[id number].  Per [place] he is closing the other 2 cases out but will forward my prior authorization-reference number :[id number] (he verified that my claim has clinical documents attached but the other 2 do not) forward in the prior authorization process and that they will fax [place] their determination once reached.  He thanked me for returning his call.

## 2022-12-30 NOTE — Telephone Encounter (Signed)
George Dickson from [place] is requesting a callback from [place] regarding clarity on sleep study and procedure code. He'd like to discuss further when called back at [phone] ext [phone]. He stated if not called back within an hour, he's going to call again to speak with her. Please advise

## 2023-01-03 NOTE — Telephone Encounter (Signed)
**Note De-Identified Laquandra Carrillo Obfuscation** Late Entry: After hours call at 5:29 pm on 12/31/2022  I received a call on my direct line (cell phone) from [place] with [employer] advising me that the pt does not meet the criteria for a Split Night Sleep Study and recommended changing to a Home Sleep Test.  Forwarding to Edd Fabian, NP for advisement.

## 2023-01-03 NOTE — Telephone Encounter (Signed)
**Note De-Identified [name] Obfuscation** Call received from [place] D. With [employer] advising me that she needs clinicals to complete the pts Split Night Sleep Study Case #: [id number] that she says was submitted this morning.  I advised her that [name] at [employer] called me Friday evening stating that they denied the claim as the pt did not meet the criteria for a Split Night.  [name] States that case #: [id number] was canceled (?) and that all she needs to proceed with Case #: [id number] is the clinicals to be faxed to her at [phone].  I have faxed the pts Clinical notes with the pts name, DOB and Case #: [id number] written at the top of each page to [facility] as requested. Fax: Tx 'ok' Report CONE_EMAIL-to-Fax Tore Carreker, Elease Hashimoto This message was sent Ritu Gagliardo [facility], a product from [employer]. http://www.biscom.com/ [employer], Workflow & Document Management Software  [employer] provides modern [employer], workflow, and document Development worker, international aid for [facility] [facility]. www.biscom.com                   -------Fax Transmission Report------- To:               Recipient at [phone] Subject:          Fw: [employer] Result:           The transmission was successful. Explanation:      All Pages Ok Pages Sent:       11 Connect Time:     4 minutes, 59 seconds Transmit Time:    01/03/2023 10:36 Transfer Rate:    14400 Status Code:      0000 Retry Count:      0 Job Id:           [id number] Unique Id:        [id number] Fax Line:         40 Fax Server:       [employer]

## 2023-01-04 ENCOUNTER — Telehealth: Payer: Self-pay | Admitting: Pharmacy Technician

## 2023-01-04 NOTE — Telephone Encounter (Signed)
Pharmacy Patient Advocate Encounter   Received notification from CoverMyMeds that prior authorization for Repatha SureClick 140MG /ML auto-injectors is required/requested.   Insurance verification completed.   The patient is insured through [facility] .   Per test claim: PA required; PA submitted to [facility] via CoverMyMeds Key/confirmation #/EOC [id number] Status is pending

## 2023-01-04 NOTE — Telephone Encounter (Signed)
Pharmacy Patient Advocate Encounter  Received notification from [facility] that Prior Authorization for Repatha has been  APPROVED THROUGH 8.13.25

## 2023-01-05 ENCOUNTER — Other Ambulatory Visit (HOSPITAL_COMMUNITY): Payer: Self-pay

## 2023-01-05 NOTE — Telephone Encounter (Signed)
Pharmacy Patient Advocate Encounter  Received notification from [facility] that Prior Authorization for Repatha 140mg /ml has been APPROVED from 01/04/2023 to 01/04/2024   PA #/Case ID/Reference #: [id number]

## 2023-01-28 ENCOUNTER — Telehealth: Payer: Self-pay | Admitting: General Practice

## 2023-01-28 NOTE — Telephone Encounter (Signed)
**Note De-Identified George Dickson Obfuscation** See phone note from 12/28/22 for updates.

## 2023-01-28 NOTE — Telephone Encounter (Signed)
**Note De-Identified George Dickson Obfuscation** Per the pt, he is ok with change from Split Night Sleep Study to a WatchPAT One-HST.  He is aware that someone from the office will be contacting him to arrange a time for him to come to the office to pick up a WatchPAT One-HST device, for instructions on how to use it, and to give him the PIN #.

## 2023-01-28 NOTE — Telephone Encounter (Signed)
**Note De-Identified Lashayla Armes Obfuscation** Split Night Sleep Study denied. Per Edd Fabian, NP I have canceled the Split Night Sleep Study and placed a Itamar-HST order.  I did a Itamar-HST PA through [facility] Portal and received this message: Notification or Prior Authorization is not required for the requested services. Decision ID #: [id number]  I called the pt to discuss the change in his Sleep test and to explain why but I got no answer and was unable to leave a VM message as his VM is full. I have sent him a [facility] message explaining all of the above and asking him to contact us concerning his home sleep test and providing him with a WatchPAT One-HST device and the Pin # as his ins does not require a PA for a HST.

## 2023-01-28 NOTE — Telephone Encounter (Signed)
Follow Up:    Patient is returning your call from today. 

## 2023-02-07 ENCOUNTER — Telehealth: Payer: Self-pay | Admitting: *Deleted

## 2023-02-07 NOTE — Telephone Encounter (Signed)
Pre-operative Risk Assessment    Patient Name: George Dickson  DOB: 1971-05-23 MRN: [id number]      Request for Surgical Clearance    Procedure:   Right Shoulder Arthroscopy  Date of Surgery:  Clearance 02/22/23                                 Surgeon:  Dr. Jones Broom Surgeon's Group or Practice Name:  [facility] Phone number:  [phone] Fax number:  [phone]   Type of Clearance Requested:   - Medical  - Pharmacy:  Hold Aspirin Not Indicated   Type of Anesthesia:   Choice   Additional requests/questions:    Signed, Emmit Pomfret   02/07/2023, 7:46 AM

## 2023-02-07 NOTE — Telephone Encounter (Signed)
Name: Zayvian Gewirtz Dollard  DOB: April 10, 1971  MRN: [id number]  Primary Cardiologist: Bryan Lemma, MD  Chart reviewed as part of pre-operative protocol coverage. Because of LAMONTRE MEMON past medical history and time since last visit, he will require a follow-up in-office visit in order to better assess preoperative cardiovascular risk.  Pre-op covering staff: - Please schedule appointment and call patient to inform them. If patient already had an upcoming appointment within acceptable timeframe, please add "pre-op clearance" to the appointment notes so provider is aware.  - Patient has appointment scheduled with Dr. Herbie Baltimore on 03/11/23, clearance has been requested for 02/22/2023. Can we see if this appointment can be moved up or scheduled with an APP prior to clearance request?   - Please contact requesting surgeon's office via preferred method (i.e, phone, fax) to inform them of need for appointment prior to surgery.  Ideally patient would not hold aspirin during perioperative period, if surgeon feels bleeding risk is to great he may hold aspirin for 5-7 days prior to procedure.   Rip Harbour, NP  02/07/2023, 10:03 AM

## 2023-02-08 NOTE — Telephone Encounter (Signed)
Left VM for patient to come into office and pick up Itamar device. Left address and callback number.

## 2023-02-09 NOTE — Telephone Encounter (Signed)
Patient scheduled to see J clever 9-30

## 2023-02-18 NOTE — Progress Notes (Unsigned)
Cardiology Clinic Note   Patient Name: George Dickson Date of Encounter: 02/21/2023  Primary Care Provider:  Leilani Able, MD Primary Cardiologist:  Bryan Lemma, MD  Patient Profile    George Dickson 52 year old male presents the clinic today for follow-up evaluation of his coronary artery disease and hyperlipidemia.  Past Medical History    Past Medical History:  Diagnosis Date   Anxiety    CAD S/P percutaneous coronary angioplasty 10/2011   Insetting of inferior STEMI. Proximal RCA PCI with Promus Element DES - 3.0 mm x 24 mm (3.35-3.2 tapered)   Dyslipidemia    GERD (gastroesophageal reflux disease)    occasionally takes TUMS   Headache(784.0)    Hypertension    Kidney stones    OSA (obstructive sleep apnea) 12/06/2011   initial sleep study  completed, CPAP /BiPAP TITRATION DONE 12/20/11   Pneumonia    ST elevation myocardial infarction (STEMI) of inferior wall (HCC) 10/23/2011   Proximal RCA occlusion treated with DES stent   Type II diabetes mellitus (HCC)    "dx'd today" (08/13/2013)   Past Surgical History:  Procedure Laterality Date   LEFT HEART CATHETERIZATION WITH CORONARY ANGIOGRAM N/A 10/23/2011   Procedure: LEFT HEART CATHETERIZATION WITH CORONARY ANGIOGRAM;  Surgeon: Marykay Lex, MD;  Location: [facility] CATH LAB;  Service: Cardiovascular;  Laterality: N/A;   LUMBAR LAMINECTOMY/DECOMPRESSION MICRODISCECTOMY Right 08/17/2013   Procedure: LUMBAR FOUR TO FIVE LUMBAR LAMINECTOMY/DECOMPRESSION MICRODISCECTOMY 1 LEVEL;  Surgeon: Temple Pacini, MD;  Location: [facility] NEURO ORS;  Service: Neurosurgery;  Laterality: Right;   PERCUTANEOUS CORONARY STENT INTERVENTION (PCI-S)  10/23/2011   Procedure: PERCUTANEOUS CORONARY STENT INTERVENTION (PCI-S) - With Aspiration Thrombectomy;  Surgeon: Marykay Lex, MD;  Location: [facility] CATH LAB;  Service: Cardiovascular;; PCI - proximal  RCA with  Promus ELEMENT  DES : 3.0 mm x 24mm ; taper post-dilation: 3.35-3.2 mm   SHOULDER ARTHROSCOPY Right  1992   TRANSTHORACIC ECHOCARDIOGRAM  10/25/2011   EF 60-65%;LV diastolic function normal ,systolic function normal    Allergies  Allergies  Allergen Reactions   Lisinopril Cough    History of Present Illness    George Dickson has a PMH of coronary artery disease, STEMI, type 2 diabetes, hyperlipidemia, obesity, and HTN.  He had a distant MI at young age.  Underwent cardiac catheterization 10/23/2011 with PCI and stenting to his RCA.  An echocardiogram 10/25/2011 showed EF of 60 to 65%, no wall motion abnormalities.  No evidence of valvular disease.  He followed up with Dr. Herbie Baltimore on 04/05/2022.  He had been off of his medications for quite some time.  He had not had any significant chest pain or pressure.  He did note rare pinprick type pain.  He had gained weight and lost weight.  He had also lost touch with his PCP.  The importance of managing his blood sugar was reiterated.  He noted myalgia with ezetimibe and Lipitor.  He was approved for Repatha.  He was taking Ozempic and noting good results.  He presented to the clinic 09/21/22 for follow-up evaluation and stated he had been waking up frequently at night and was short of breath.  He was previously told that he had sleep apnea.  He reported that t was around 11 years ago.  He had not been happy with his PCP and reported that he frequently ran out of medication because refills were not provided.  His blood pressure initially in the clinic today was 140/74 and on  recheck was 132/86.  We reviewed his medications and catheterization.  He expressed understanding.  I ordered a split-night sleep study, CBC, BMP, and provide list for PCP.   Follow-up in 6 months was planned.  He presents to the clinic today for follow-up evaluation and preoperative cardiac evaluation.  He states he continues to try to stop smoking.  He has upcoming shoulder surgery planned for tomorrow.  He misunderstood instructions for his medication.  He has not been taking his blood  pressure medications for the past 5 days.  His blood pressure initially today is 158/84 and on recheck is 152/84.  He does note neck and right shoulder pain.  His EKG today shows normal sinus rhythm with incomplete right bundle branch block 71 bpm.  He reports 2 weeks ago he had a week of palpitations.  He denied dizziness with these.  He did take some nitroglycerin and aspirin and his palpitations resolved.  He brings in his home sleep study device which she was not able to get to work.  We will give him a new code for initiation.  We reviewed his previous echocardiogram.  We discussed returning in 2 months for follow-up, reviewed the importance of avoiding triggers for palpitations, and discussed risk factors for sleep apnea.  Today he denies chest pain, shortness of breath, lower extremity edema, fatigue,  melena, hematuria, hemoptysis, diaphoresis, weakness, presyncope, syncope, orthopnea, and PND.    Home Medications    Prior to Admission medications   Medication Sig Start Date End Date Taking? Authorizing Provider  albuterol (VENTOLIN HFA) 108 (90 Base) MCG/ACT inhaler Inhale 1-2 puffs into the lungs every 6 (six) hours as needed for wheezing or shortness of breath. 03/03/20   Wieters, Hallie C, PA-C  amLODipine (NORVASC) 10 MG tablet Take 1 tablet by mouth daily. 05/21/20   [provider]  aspirin EC 81 MG tablet Take 1 tablet (81 mg total) by mouth daily. 02/03/17   Marykay Lex, MD  atorvastatin (LIPITOR) 40 MG tablet Take 40 mg by mouth daily.    [provider]  Continuous Blood Gluc Receiver (FREESTYLE LIBRE 2 READER) DEVI 1 Device by Does not apply route as directed. Patient not taking: Reported on 04/05/2022 02/22/20   Shamleffer, Konrad Dolores, MD  Continuous Blood Gluc Sensor (FREESTYLE LIBRE 2 SENSOR) MISC 1 Device by Does not apply route as directed. Patient not taking: Reported on 04/05/2022 02/22/20   Shamleffer, Konrad Dolores, MD  Evolocumab (REPATHA  SURECLICK) 140 MG/ML SOAJ INJECT 140MG  INTO THE SKIN EVERY 14 DAYS 05/04/22   Marykay Lex, MD  ezetimibe (ZETIA) 10 MG tablet Take 1 tablet (10 mg total) by mouth daily. 05/21/20 08/19/20  Marykay Lex, MD  FARXIGA 10 MG TABS tablet Take 10 mg by mouth daily. 09/08/19   [provider]  HUMALOG KWIKPEN 100 UNIT/ML KwikPen Inject into the skin. 10/16/19   [provider]  icosapent Ethyl (VASCEPA) 1 g capsule Take 2 capsules (2 g total) by mouth 2 (two) times daily. 12/11/19   Marykay Lex, MD  LANTUS SOLOSTAR 100 UNIT/ML Solostar Pen SMARTSIG:80 Unit(s) SUB-Q Every Evening 01/30/20   [provider]  losartan (COZAAR) 100 MG tablet Take 1 tablet (100 mg total) by mouth daily. 04/05/22   Marykay Lex, MD  nitroGLYCERIN (NITROSTAT) 0.4 MG SL tablet Place 1 tablet (0.4 mg total) under the tongue every 5 (five) minutes as needed for chest pain. 04/05/22 07/04/22  Marykay Lex, MD  ondansetron (  ZOFRAN ODT) 4 MG disintegrating tablet Take 1 tablet (4 mg total) by mouth every 8 (eight) hours as needed for nausea or vomiting. 11/11/20   Wieters, Hallie C, PA-C  OZEMPIC, 0.25 OR 0.5 MG/DOSE, 2 MG/3ML SOPN SMARTSIG:0.25 Milligram(s) SUB-Q Once a Week 02/22/22   [provider]    Family History    Family History  Problem Relation Age of Onset   Coronary artery disease Mother    Hypertension Mother    Diabetes type II Mother    He indicated that his mother is alive. He indicated that his father is alive.  Social History    Social History   Socioeconomic History   Marital status: Divorced    Spouse name: Not on file   Number of children: Not on file   Years of education: Not on file   Highest education level: Not on file  Occupational History   Not on file  Tobacco Use   Smoking status: Every Day    Current packs/day: 0.50    Average packs/day: 0.5 packs/day for 4.0 years (2.0 ttl pk-yrs)    Types: Cigars, Cigarettes   Smokeless tobacco:  Never  Vaping Use   Vaping status: Never Used  Substance and Sexual Activity   Alcohol use: Yes   Drug use: No    Comment: in the past   Sexual activity: Yes  Other Topics Concern   Not on file  Social History Narrative   Not on file   Social Determinants of Health   Financial Resource Strain: Not on file  Food Insecurity: Not on file  Transportation Needs: Not on file  Physical Activity: Not on file  Stress: Not on file  Social Connections: Unknown (09/06/2022)   [facility]   Social Network    Social Network: Not on file  Intimate Partner Violence: Unknown (09/06/2022)   Received from [facility]   HITS    Physically Hurt: Not on file    Insult or Talk Down To: Not on file    Threaten Physical Harm: Not on file    Scream or Curse: Not on file     Review of Systems    General:  No chills, fever, night sweats or weight changes.  Cardiovascular:  No chest pain, dyspnea on exertion, edema, orthopnea, palpitations, paroxysmal nocturnal dyspnea. Dermatological: No rash, lesions/masses Respiratory: No cough, dyspnea Urologic: No hematuria, dysuria Abdominal:   No nausea, vomiting, diarrhea, bright red blood per rectum, melena, or hematemesis Neurologic:  No visual changes, wkns, changes in mental status. All other systems reviewed and are otherwise negative except as noted above.  Physical Exam    VS:  BP (!) 152/84   Pulse 71   Ht 6\' 3"  (1.905 m)   Wt (!) 300 lb 3.2 oz (136.2 kg)   SpO2 96%   BMI 37.52 kg/m  , BMI Body mass index is 37.52 kg/m. GEN: Well nourished, well developed, in no acute distress. HEENT: normal. Neck: Supple, no JVD, carotid bruits, or masses. Cardiac: RRR, no murmurs, rubs, or gallops. No clubbing, cyanosis, edema.  Radials/DP/PT 2+ and equal bilaterally.  Respiratory:  Respirations regular and unlabored, clear to auscultation bilaterally. GI: Soft, nontender, nondistended, BS + x 4. MS: no  deformity or atrophy. Skin: warm and dry, no rash. Neuro:  Strength and sensation are intact. Psych: Normal affect.  Accessory Clinical Findings    Recent Labs: 04/05/2022: ALT 30 09/21/2022: BUN 10; Creatinine, Ser 0.83; Hemoglobin  17.0; Platelets 189; Potassium 4.2; Sodium 141   Recent Lipid Panel    Component Value Date/Time   CHOL 104 04/05/2022 1126   TRIG 113 04/05/2022 1126   HDL 41 04/05/2022 1126   CHOLHDL 2.5 04/05/2022 1126   CHOLHDL 12.0 08/14/2013 0156   VLDL UNABLE TO CALCULATE IF TRIGLYCERIDE OVER 400 mg/dL 81/19/1478 2956   LDLCALC 42 04/05/2022 1126    HYPERTENSION CONTROL Vitals:   02/21/23 0844 02/21/23 0914  BP: (!) 154/84 (!) 152/84    The patient's blood pressure is elevated above target today.  In order to address the patient's elevated BP: Blood pressure will be monitored at home to determine if medication changes need to be made. (Has been off medications antihypertensive for 5 days)       ECG personally reviewed by me today-EKG Interpretation Date/Time:  Monday February 21 2023 08:41:50 EDT Ventricular Rate:  71 PR Interval:  176 QRS Duration:  106 QT Interval:  370 QTC Calculation: 402 R Axis:   -13  Text Interpretation: Normal sinus rhythm Incomplete right bundle branch block Minimal voltage criteria for LVH, may be normal variant ( Cornell product ) When compared with ECG of 06-Jan-2017 22:26, No significant change was found Confirmed by Edd Fabian [phone]) on 02/21/2023 8:48:19 AM   Echocardiogram 10/25/2011  Study Conclusions   - Left ventricle: The cavity size was normal. Wall thickness    was increased. There was hypertrophy, with an appearance    suggesting concentric remodeling (increased wall thickness    with normal wall mass). Systolic function was normal. The    estimated ejection fraction was in the range of 60% to    65%. Wall motion was normal; there were no regional wall    motion abnormalities. Left ventricular  diastolic function    parameters were normal.  - Atrial septum: There was increased thickness of the    septum, consistent with lipomatous hypertrophy.  Impressions:   - Normal study. No evidence of valve disease.  Transthoracic echocardiography.  M-mode, complete 2D,  spectral Doppler, and color Doppler.  Height:  Height:  190.5cm. Height: 75in.  Weight:  Weight: 135.6kg. Weight:  298.4lb.  Body mass index:  BMI: 37.4kg/m^2.  Body surface  area:    BSA: 2.65m^2.  Blood pressure:     121/75.  Patient  status:  Inpatient.  Location:  Bedside.   ------------------------------------------------------------   ------------------------------------------------------------  Left ventricle:  The cavity size was normal. Wall thickness  was increased. There was hypertrophy, with an appearance  suggesting concentric remodeling (increased wall thickness  with normal wall mass). Systolic function was normal. The  estimated ejection fraction was in the range of 60% to 65%.  Wall motion was normal; there were no regional wall motion  abnormalities. The transmitral flow pattern was normal. The  deceleration time of the early transmitral flow velocity was  normal. The pulmonary vein flow pattern was normal. The  tissue Doppler parameters were normal. Left ventricular  diastolic function parameters were normal. There was no  evidence of elevated ventricular filling pressure by Doppler  parameters.   ------------------------------------------------------------  Aortic valve:   Trileaflet; normal thickness leaflets.  Mobility was not restricted.  Doppler:  Transvalvular  velocity was within the normal range. There was no stenosis.   No regurgitation.   ------------------------------------------------------------  Aorta: Aortic root: The aortic root was normal in size.  Ascending aorta: The ascending aorta was normal in size.   ------------------------------------------------------------  Mitral  valve:   Normal-sized  annulus. Structurally normal  valve. Normal thickness leaflets . Leaflet separation was  normal. Mobility was not restricted. No echocardiographic  evidence for prolapse.  Doppler:  Transvalvular velocity was  within the normal range. There was no evidence for stenosis.   No regurgitation.    Peak gradient: 3mm Hg (D).   ------------------------------------------------------------  Left atrium:  The atrium was normal in size.   ------------------------------------------------------------  Atrial septum:  There was increased thickness of the septum,  consistent with lipomatous hypertrophy.   ------------------------------------------------------------  Right ventricle:  The cavity size was normal. Wall thickness  was normal. Systolic function was normal.   ------------------------------------------------------------  Pulmonic valve:   Poorly visualized.  The valve appears to  be grossly normal.    Doppler:  Transvalvular velocity was  within the normal range. There was no evidence for stenosis.   No significant regurgitation.   ------------------------------------------------------------  Tricuspid valve:   Structurally normal valve.    Doppler:  Transvalvular velocity was within the normal range.  No  regurgitation.   ------------------------------------------------------------  Pulmonary artery:   The main pulmonary artery was  normal-sized.  Systolic pressure could not be accurately  estimated.   ------------------------------------------------------------  Right atrium:  The atrium was normal in size.   ------------------------------------------------------------  Pericardium: There was no pericardial effusion.   ------------------------------------------------------------  Systemic veins:  Inferior vena cava: The vessel was normal in size; the  respirophasic diameter changes were in the normal range (=  50%); findings are consistent with normal  central venous  pressure.    Assessment & Plan   1.  Coronary artery disease-continues to deny chest discomfort.  Exercising more regularly. Continue current medical therapy Heart healthy low-sodium diet Maintain physical activity   Essential hypertension-BP today 152/84.  Has not taken blood pressure medication in the last 5 days.  Recommended resuming blood pressure medication and obtaining blood pressure monitor Maintain blood pressure log Low-sodium diet-salty 6 reviewed Continue losartan, amlodipine  Hyperlipidemia-LDL 42 on 04/05/2022.  Reports compliance with Repatha. Follows with pharmacy lipid clinic High-fiber diet  Type 2 diabetes-glucose 80 on 09/21/2022.   Continue Farxiga, Ozempic Increase physical activity as tolerated  Palpitations-reports a week of palpitations about 2 weeks ago.  EKG today shows sinus rhythm with unchanged incomplete right bundle branch block.  Appears to be related to sleep apnea.  We will have him complete his home sleep study.  Plan for/recommend 30-day cardiac event monitor if palpitations return. Continue weight loss Avoid supine sleeping Maintain physical activity  Daytime somnolence- Stop bang 8. Epworth score 15. Periods of apnea with napping. Previously ordered split-night sleep study/home WatchPAT-needs code to initiate exam. Will await results  Preoperative cardiac evaluation-right shoulder arthroscopy, 02/22/2023, Dr. Jones Broom, [facility], fax #[phone]    Primary Cardiologist: Bryan Lemma, MD  Chart reviewed as part of pre-operative protocol coverage. Given past medical history and time since last visit, based on ACC/AHA guidelines, George Dickson would be at acceptable risk for the planned procedure without further cardiovascular testing.   His RCRI is low risk, 0.9% risk of major cardiac event.  He is able to complete greater than 4 METS of physical activity.  Patient was advised that if he develops new  symptoms prior to surgery to contact our office to arrange a follow-up appointment.  He verbalized understanding.  I will route this recommendation to the requesting party via Epic fax function and remove from pre-op pool.    Disposition: Follow-up with Dr. Herbie Baltimore or me in 12 months.  Thomasene Ripple. Dosha Broshears NP-C     02/21/2023, 9:14 AM [facility] [address] Office [phone] Fax [phone]    I spent 14 minutes examining this patient, reviewing medications, and using patient centered shared decision making involving her cardiac care.  Prior to her visit I spent greater than 20 minutes reviewing her past medical history,  medications, and prior cardiac tests.

## 2023-02-21 ENCOUNTER — Ambulatory Visit: Payer: 59 | Attending: General Practice | Admitting: General Practice

## 2023-02-21 ENCOUNTER — Encounter: Payer: Self-pay | Admitting: General Practice

## 2023-02-21 VITALS — BP 152/84 | HR 71 | Ht 75.0 in | Wt 300.2 lb

## 2023-02-21 DIAGNOSIS — E118 Type 2 diabetes mellitus with unspecified complications: Secondary | ICD-10-CM

## 2023-02-21 DIAGNOSIS — Z01810 Encounter for preprocedural cardiovascular examination: Secondary | ICD-10-CM

## 2023-02-21 DIAGNOSIS — I10 Essential (primary) hypertension: Secondary | ICD-10-CM | POA: Diagnosis not present

## 2023-02-21 DIAGNOSIS — I25119 Atherosclerotic heart disease of native coronary artery with unspecified angina pectoris: Secondary | ICD-10-CM

## 2023-02-21 DIAGNOSIS — Z794 Long term (current) use of insulin: Secondary | ICD-10-CM

## 2023-02-21 DIAGNOSIS — E785 Hyperlipidemia, unspecified: Secondary | ICD-10-CM

## 2023-02-21 DIAGNOSIS — R400 Somnolence: Secondary | ICD-10-CM

## 2023-02-21 DIAGNOSIS — E1169 Type 2 diabetes mellitus with other specified complication: Secondary | ICD-10-CM

## 2023-02-21 NOTE — Patient Instructions (Signed)
Medication Instructions:  The current medical regimen is effective;  continue present plan and medications as directed. Please refer to the Current Medication list given to you today.  *If you need a refill on your cardiac medications before your next appointment, please call your pharmacy*  Lab Work: NONE If you have labs (blood work) drawn today and your tests are completely normal, you will receive your results only by:  MyChart Message (if you have MyChart) OR  A paper copy in the mail If you have any lab test that is abnormal or we need to change your treatment, we will call you to review the results.  Other Instructions -CALL WITH INCREASED PALPITATIONS -TRY YOUR WATCH SLEEP APNEA DEVICE AGAIN TONIGHT PLEASE CALL IF THIS DOES NOT WORK WE WILL DISCUSS NEXT STEPS IF THIS DOES NOT WORK-USE THE SAME BTD#[id number] -TAKE AND LOG YOUR BLOOD PRESSURE ST LEAST 3-4 TIMES WEEKLY TAKE AT LEAST 1 HOUR AFTER TAKING YOUR MEDICATION -PLEASE READ AND FOLLOW ATTACHED  SALTY 6 -SEE SMOKING CESSATION ATTACHED  Follow-Up: At [facility], you and your health needs are our priority.  As part of our continuing mission to provide you with exceptional heart care, we have created designated Provider Care Teams.  These Care Teams include your primary Cardiologist (physician) and Advanced Practice Providers (APPs -  Physician Assistants and Nurse Practitioners) who all work together to provide you with the care you need, when you need it.  Your next appointment:   2 month(s)  Provider:   Bryan Lemma, MD  or Edd Fabian, FNP              Steps to Quit Smoking Smoking tobacco is the leading cause of preventable death. It can affect almost every organ in the body. Smoking puts you and people around you at risk for many serious, long-lasting (chronic) diseases. Quitting smoking can be hard, but it is one of the best things that you can do for your health. It is never too late to quit. Do not give up  if you cannot quit the first time. Some people need to try many times to quit. Do your best to stick to your quit plan, and talk with your doctor if you have any questions or concerns. How do I get ready to quit? Pick a date to quit. Set a date within the next 2 weeks to give you time to prepare. Write down the reasons why you are quitting. Keep this list in places where you will see it often. Tell your family, friends, and co-workers that you are quitting. Their support is important. Talk with your doctor about the choices that may help you quit. Find out if your health insurance will pay for these treatments. Know the people, places, things, and activities that make you want to smoke (triggers). Avoid them. What first steps can I take to quit smoking? Throw away all cigarettes at home, at work, and in your car. Throw away the things that you use when you smoke, such as ashtrays and lighters. Clean your car. Empty the ashtray. Clean your home, including curtains and carpets. What can I do to help me quit smoking? Talk with your doctor about taking medicines and seeing a counselor. You are more likely to succeed when you do both. If you are pregnant or breastfeeding: Talk with your doctor about counseling or other ways to quit smoking. Do not take medicine to help you quit smoking unless your doctor tells you to. Quit right  away Quit smoking completely, instead of slowly cutting back on how much you smoke over a period of time. Stopping smoking right away may be more successful than slowly quitting. Go to counseling. In-person is best if this is an option. You are more likely to quit if you go to counseling sessions regularly. Take medicine You may take medicines to help you quit. Some medicines need a prescription, and some you can buy over-the-counter. Some medicines may contain a drug called nicotine to replace the nicotine in cigarettes. Medicines may: Help you stop having the desire to  smoke (cravings). Help to stop the problems that come when you stop smoking (withdrawal symptoms). Your doctor may ask you to use: Nicotine patches, gum, or lozenges. Nicotine inhalers or sprays. Non-nicotine medicine that you take by mouth. Find resources Find resources and other ways to help you quit smoking and remain smoke-[place] after you quit. They include: Online chats with a Veterinary surgeon. Phone quitlines. Printed Materials engineer. Support groups or group counseling. Text messaging programs. Mobile phone apps. Use apps on your mobile phone or tablet that can help you stick to your quit plan. Examples of Doro services include Quit Guide from the CDC and smokefree.gov  What can I do to make it easier to quit?  Talk to your family and friends. Ask them to support and encourage you. Call a phone quitline, such as 1-800-QUIT-NOW, reach out to support groups, or work with a Veterinary surgeon. Ask people who smoke to not smoke around you. Avoid places that make you want to smoke, such as: Bars. Parties. Smoke-break areas at work. Spend time with people who do not smoke. Lower the stress in your life. Stress can make you want to smoke. Try these things to lower stress: Getting regular exercise. Doing deep-breathing exercises. Doing yoga. Meditating. What benefits will I see if I quit smoking? Over time, you may have: A better sense of smell and taste. Less coughing and sore throat. A slower heart rate. Lower blood pressure. Clearer skin. Better breathing. Fewer sick days. Summary Quitting smoking can be hard, but it is one of the best things that you can do for your health. Do not give up if you cannot quit the first time. Some people need to try many times to quit. When you decide to quit smoking, make a plan to help you succeed. Quit smoking right away, not slowly over a period of time. When you start quitting, get help and support to keep you smoke-[place]. This information is not  intended to replace advice given to you by your health care provider. Make sure you discuss any questions you have with your health care provider. Document Revised: 05/01/2021 Document Reviewed: 05/01/2021 Elsevier Patient Education  2024 [employer].

## 2023-03-11 ENCOUNTER — Ambulatory Visit: Payer: 59 | Admitting: Cardiology

## 2023-04-01 NOTE — Progress Notes (Unsigned)
Cardiology Clinic Note   Patient Name: George Dickson Date of Encounter: 04/01/2023  Primary Care Provider:  Leilani Able, Dickson Primary Cardiologist:  George Lemma, Dickson  Patient Profile    George Dickson 52 year old male presents the clinic today for follow-up evaluation of his coronary artery disease and hyperlipidemia.  Past Medical History    Past Medical History:  Diagnosis Date   Anxiety    CAD S/P percutaneous coronary angioplasty 10/2011   Insetting of inferior STEMI. Proximal RCA PCI with Promus Element DES - 3.0 mm x 24 mm (3.35-3.2 tapered)   Dyslipidemia    GERD (gastroesophageal reflux disease)    occasionally takes TUMS   Headache(784.0)    Hypertension    Kidney stones    OSA (obstructive sleep apnea) 12/06/2011   initial sleep study  completed, CPAP /BiPAP TITRATION DONE 12/20/11   Pneumonia    ST elevation myocardial infarction (STEMI) of inferior wall (HCC) 10/23/2011   Proximal RCA occlusion treated with DES stent   Type II diabetes mellitus (HCC)    "dx'd today" (08/13/2013)   Past Surgical History:  Procedure Laterality Date   LEFT HEART CATHETERIZATION WITH CORONARY ANGIOGRAM N/A 10/23/2011   Procedure: LEFT HEART CATHETERIZATION WITH CORONARY ANGIOGRAM;  Surgeon: George Lex, Dickson;  Location: [facility] CATH LAB;  Service: Cardiovascular;  Laterality: N/A;   LUMBAR LAMINECTOMY/DECOMPRESSION MICRODISCECTOMY Right 08/17/2013   Procedure: LUMBAR FOUR TO FIVE LUMBAR LAMINECTOMY/DECOMPRESSION MICRODISCECTOMY 1 LEVEL;  Surgeon: George Dickson;  Location: [facility] NEURO ORS;  Service: Neurosurgery;  Laterality: Right;   PERCUTANEOUS CORONARY STENT INTERVENTION (PCI-S)  10/23/2011   Procedure: PERCUTANEOUS CORONARY STENT INTERVENTION (PCI-S) - With Aspiration Thrombectomy;  Surgeon: George Lex, Dickson;  Location: [facility] CATH LAB;  Service: Cardiovascular;; PCI - proximal  RCA with  Promus ELEMENT  DES : 3.0 mm x 24mm ; taper post-dilation: 3.35-3.2 mm   SHOULDER ARTHROSCOPY Right  1992   TRANSTHORACIC ECHOCARDIOGRAM  10/25/2011   EF 60-65%;LV diastolic function normal ,systolic function normal    Allergies  Allergies  Allergen Reactions   Lisinopril Cough    History of Present Illness    George Dickson has a PMH of coronary artery disease, STEMI, type 2 diabetes, hyperlipidemia, obesity, and HTN.  He had a distant MI at young age.  Underwent cardiac catheterization 10/23/2011 with PCI and stenting to his RCA.  An echocardiogram 10/25/2011 showed EF of 60 to 65%, no wall motion abnormalities.  No evidence of valvular disease.  He followed up with Dr. Herbie Dickson on 04/05/2022.  He had been off of his medications for quite some time.  He had not had any significant chest pain or pressure.  He did note rare pinprick type pain.  He had gained weight and lost weight.  He had also lost touch with his PCP.  The importance of managing his blood sugar was reiterated.  He noted myalgia with ezetimibe and Lipitor.  He was approved for Repatha.  He was taking Ozempic and noting good results.  He presented to the clinic 09/21/22 for follow-up evaluation and stated he had been waking up frequently at night and was short of breath.  He was previously told that he had sleep apnea.  He reported that t was around 11 years ago.  He had not been happy with his PCP and reported that he frequently ran out of medication because refills were not provided.  His blood pressure initially in the clinic today was 140/74 and on  recheck was 132/86.  We reviewed his medications and catheterization.  He expressed understanding.  I ordered a split-night sleep study, CBC, BMP, and provide list for PCP.   Follow-up in 6 months was planned.  He presented to the clinic 02/21/23 for follow-up evaluation and preoperative cardiac evaluation.  He stated he continued to try to stop smoking.  He had upcoming shoulder surgery planned for the next day.  He misunderstood instructions for his medication.  He had not been taking his  blood pressure medications for the past 5 days.  His blood pressure initially was 158/84 and on recheck was 152/84.  He did note neck and right shoulder pain.  His EKG  showed normal sinus rhythm with incomplete right bundle branch block 71 bpm.  He reported 2 weeks prior he had a week of palpitations.  He denied dizziness with them.  He did take some nitroglycerin and aspirin and his palpitations resolved.  He brought in his home sleep study device which he was not Dickson to get to work.  I gave him a new code for initiation.  We reviewed his previous echocardiogram.  We discussed returning in 2 months for follow-up, reviewed the importance of avoiding triggers for palpitations, and discussed risk factors for sleep apnea.  He presents to the clinic today for follow-up evaluation and states***.  Today he denies chest pain, shortness of breath, lower extremity edema, fatigue,  melena, hematuria, hemoptysis, diaphoresis, weakness, presyncope, syncope, orthopnea, and PND.    Home Medications    Prior to Admission medications   Medication Sig Start Date End Date Taking? Authorizing Provider  albuterol (VENTOLIN HFA) 108 (90 Base) MCG/ACT inhaler Inhale 1-2 puffs into the lungs every 6 (six) hours as needed for wheezing or shortness of breath. 03/03/20   Dickson, George C, PA-C  amLODipine (NORVASC) 10 MG tablet Take 1 tablet by mouth daily. 05/21/20   Provider, Historical, Dickson  aspirin EC 81 MG tablet Take 1 tablet (81 mg total) by mouth daily. 02/03/17   George Lex, Dickson  atorvastatin (LIPITOR) 40 MG tablet Take 40 mg by mouth daily.    Provider, Historical, Dickson  Continuous Blood Gluc Receiver (FREESTYLE LIBRE 2 READER) DEVI 1 Device by Does not apply route as directed. Patient not taking: Reported on 04/05/2022 02/22/20   Shamleffer, Konrad Dolores, Dickson  Continuous Blood Gluc Sensor (FREESTYLE LIBRE 2 SENSOR) MISC 1 Device by Does not apply route as directed. Patient not taking: Reported on 04/05/2022  02/22/20   Shamleffer, Konrad Dolores, Dickson  Evolocumab (REPATHA SURECLICK) 140 MG/ML SOAJ INJECT 140MG  INTO THE SKIN EVERY 14 DAYS 05/04/22   George Lex, Dickson  ezetimibe (ZETIA) 10 MG tablet Take 1 tablet (10 mg total) by mouth daily. 05/21/20 08/19/20  George Lex, Dickson  FARXIGA 10 MG TABS tablet Take 10 mg by mouth daily. 09/08/19   Provider, Historical, Dickson  HUMALOG KWIKPEN 100 UNIT/ML KwikPen Inject into the skin. 10/16/19   Provider, Historical, Dickson  icosapent Ethyl (VASCEPA) 1 g capsule Take 2 capsules (2 g total) by mouth 2 (two) times daily. 12/11/19   George Lex, Dickson  LANTUS SOLOSTAR 100 UNIT/ML Solostar Pen SMARTSIG:80 Unit(s) SUB-Q Every Evening 01/30/20   Provider, Historical, Dickson  losartan (COZAAR) 100 MG tablet Take 1 tablet (100 mg total) by mouth daily. 04/05/22   George Lex, Dickson  nitroGLYCERIN (NITROSTAT) 0.4 MG SL tablet Place 1 tablet (0.4 mg total) under the tongue every 5 (five) minutes as needed  for chest pain. 04/05/22 07/04/22  George Lex, Dickson  ondansetron (ZOFRAN ODT) 4 MG disintegrating tablet Take 1 tablet (4 mg total) by mouth every 8 (eight) hours as needed for nausea or vomiting. 11/11/20   Dickson, George C, PA-C  OZEMPIC, 0.25 OR 0.5 MG/DOSE, 2 MG/3ML SOPN SMARTSIG:0.25 Milligram(s) SUB-Q Once a Week 02/22/22   Provider, Historical, Dickson    Family History    Family History  Problem Relation Age of Onset   Coronary artery disease Mother    Hypertension Mother    Diabetes type II Mother    He indicated that his mother is alive. He indicated that his father is alive.  Social History    Social History   Socioeconomic History   Marital status: Divorced    Spouse name: Not on file   Number of children: Not on file   Years of education: Not on file   Highest education level: Not on file  Occupational History   Not on file  Tobacco Use   Smoking status: Every Day    Current packs/day: 0.50    Average packs/day: 0.5 packs/day for 4.0 years (2.0 ttl  pk-yrs)    Types: Cigars, Cigarettes   Smokeless tobacco: Never  Vaping Use   Vaping status: Never Used  Substance and Sexual Activity   Alcohol use: Yes   Drug use: No    Comment: in the past   Sexual activity: Yes  Other Topics Concern   Not on file  Social History Narrative   Not on file   Social Determinants of Health   Financial Resource Strain: Not on file  Food Insecurity: Not on file  Transportation Needs: Not on file  Physical Activity: Not on file  Stress: Not on file  Social Connections: Unknown (09/06/2022)   [facility], [facility]   Social Network    Social Network: Not on file  Intimate Partner Violence: Unknown (09/06/2022)   Received from [facility], [facility]   HITS    Physically Hurt: Not on file    Insult or Talk Down To: Not on file    Threaten Physical Harm: Not on file    Scream or Curse: Not on file     Review of Systems    General:  No chills, fever, night sweats or weight changes.  Cardiovascular:  No chest pain, dyspnea on exertion, edema, orthopnea, palpitations, paroxysmal nocturnal dyspnea. Dermatological: No rash, lesions/masses Respiratory: No cough, dyspnea Urologic: No hematuria, dysuria Abdominal:   No nausea, vomiting, diarrhea, bright red blood per rectum, melena, or hematemesis Neurologic:  No visual changes, wkns, changes in mental status. All other systems reviewed and are otherwise negative except as noted above.  Physical Exam    VS:  There were no vitals taken for this visit. , BMI There is no height or weight on file to calculate BMI. GEN: Well nourished, well developed, in no acute distress. HEENT: normal. Neck: Supple, no JVD, carotid bruits, or masses. Cardiac: RRR, no murmurs, rubs, or gallops. No clubbing, cyanosis, edema.  Radials/DP/PT 2+ and equal bilaterally.  Respiratory:  Respirations regular and unlabored, clear to auscultation bilaterally. GI: Soft, nontender, nondistended, BS + x  4. MS: no deformity or atrophy. Skin: warm and dry, no rash. Neuro:  Strength and sensation are intact. Psych: Normal affect.  Accessory Clinical Findings    Recent Labs: 04/05/2022: ALT 30 09/21/2022: BUN 10; Creatinine, Ser 0.83; Hemoglobin 17.0; Platelets 189; Potassium 4.2; Sodium 141  Recent Lipid Panel    Component Value Date/Time   CHOL 104 04/05/2022 1126   TRIG 113 04/05/2022 1126   HDL 41 04/05/2022 1126   CHOLHDL 2.5 04/05/2022 1126   CHOLHDL 12.0 08/14/2013 0156   VLDL UNABLE TO CALCULATE IF TRIGLYCERIDE OVER 400 mg/dL 16/02/9603 5409   LDLCALC 42 04/05/2022 1126    No BP recorded.  {Refresh Note OR Click here to enter BP  :1}***    ECG personally reviewed by me today-***   Echocardiogram 10/25/2011  Study Conclusions   - Left ventricle: The cavity size was normal. Wall thickness    was increased. There was hypertrophy, with an appearance    suggesting concentric remodeling (increased wall thickness    with normal wall mass). Systolic function was normal. The    estimated ejection fraction was in the range of 60% to    65%. Wall motion was normal; there were no regional wall    motion abnormalities. Left ventricular diastolic function    parameters were normal.  - Atrial septum: There was increased thickness of the    septum, consistent with lipomatous hypertrophy.  Impressions:   - Normal study. No evidence of valve disease.  Transthoracic echocardiography.  M-mode, complete 2D,  spectral Doppler, and color Doppler.  Height:  Height:  190.5cm. Height: 75in.  Weight:  Weight: 135.6kg. Weight:  298.4lb.  Body mass index:  BMI: 37.4kg/m^2.  Body surface  area:    BSA: 2.88m^2.  Blood pressure:     121/75.  Patient  status:  Inpatient.  Location:  Bedside.   ------------------------------------------------------------   ------------------------------------------------------------  Left ventricle:  The cavity size was normal. Wall thickness  was  increased. There was hypertrophy, with an appearance  suggesting concentric remodeling (increased wall thickness  with normal wall mass). Systolic function was normal. The  estimated ejection fraction was in the range of 60% to 65%.  Wall motion was normal; there were no regional wall motion  abnormalities. The transmitral flow pattern was normal. The  deceleration time of the early transmitral flow velocity was  normal. The pulmonary vein flow pattern was normal. The  tissue Doppler parameters were normal. Left ventricular  diastolic function parameters were normal. There was no  evidence of elevated ventricular filling pressure by Doppler  parameters.   ------------------------------------------------------------  Aortic valve:   Trileaflet; normal thickness leaflets.  Mobility was not restricted.  Doppler:  Transvalvular  velocity was within the normal range. There was no stenosis.   No regurgitation.   ------------------------------------------------------------  Aorta: Aortic root: The aortic root was normal in size.  Ascending aorta: The ascending aorta was normal in size.   ------------------------------------------------------------  Mitral valve:   Normal-sized annulus. Structurally normal  valve. Normal thickness leaflets . Leaflet separation was  normal. Mobility was not restricted. No echocardiographic  evidence for prolapse.  Doppler:  Transvalvular velocity was  within the normal range. There was no evidence for stenosis.   No regurgitation.    Peak gradient: 3mm Hg (D).   ------------------------------------------------------------  Left atrium:  The atrium was normal in size.   ------------------------------------------------------------  Atrial septum:  There was increased thickness of the septum,  consistent with lipomatous hypertrophy.   ------------------------------------------------------------  Right ventricle:  The cavity size was normal. Wall thickness   was normal. Systolic function was normal.   ------------------------------------------------------------  Pulmonic valve:   Poorly visualized.  The valve appears to  be grossly normal.    Doppler:  Transvalvular velocity was  within the  normal range. There was no evidence for stenosis.   No significant regurgitation.   ------------------------------------------------------------  Tricuspid valve:   Structurally normal valve.    Doppler:  Transvalvular velocity was within the normal range.  No  regurgitation.   ------------------------------------------------------------  Pulmonary artery:   The main pulmonary artery was  normal-sized.  Systolic pressure could not be accurately  estimated.   ------------------------------------------------------------  Right atrium:  The atrium was normal in size.   ------------------------------------------------------------  Pericardium: There was no pericardial effusion.   ------------------------------------------------------------  Systemic veins:  Inferior vena cava: The vessel was normal in size; the  respirophasic diameter changes were in the normal range (=  50%); findings are consistent with normal central venous  pressure.    Assessment & Plan   1.  Palpitations-previously reported a week of palpitations.  EKG at last visit showed sinus rhythm with unchanged incomplete right bundle branch block.  Appeared to be related to sleep apnea.  We will have him complete his home sleep study.   Cardiac event monitor not completed Continue weight loss Avoid supine sleeping Maintain physical activity Avoid sedatives before bed, EtOH  Coronary artery disease-no chest pain.  Continues to be more physically active.  Continue current medical therapy Heart healthy low-sodium diet Maintain physical activity   Essential hypertension-BP today 152/***84.  Reports compliance with losartan and amlodipine.   Maintain blood pressure  log-recommended Low-sodium diet-salty 6 reviewed Continue losartan, amlodipine  Hyperlipidemia-LDL 42 on 04/05/2022.  Reports compliance with Repatha. Follows with pharmacy lipid clinic High-fiber diet  Type 2 diabetes-glucose 80 on 09/21/2022.   Continue Farxiga, Ozempic Increase physical activity as tolerated  Daytime somnolence- Stop bang 8. Epworth score 15. Periods of apnea with napping. Sleep study has not resulted.     Disposition: Follow-up with Dr. Herbie Dickson or me in 12 months.  Thomasene Ripple. Marabella Popiel NP-C     04/01/2023, 7:18 AM [facility] [address] Office [phone] Fax [phone]    I spent 14*** minutes examining this patient, reviewing medications, and using patient centered shared decision making involving her cardiac care.  Prior to her visit I spent greater than 20 minutes reviewing her past medical history,  medications, and prior cardiac tests.

## 2023-04-04 ENCOUNTER — Ambulatory Visit: Payer: 59 | Attending: General Practice | Admitting: General Practice

## 2023-04-04 ENCOUNTER — Encounter: Payer: Self-pay | Admitting: General Practice

## 2023-04-04 VITALS — BP 136/70 | HR 82 | Ht 75.0 in | Wt 291.0 lb

## 2023-04-04 DIAGNOSIS — R002 Palpitations: Secondary | ICD-10-CM | POA: Diagnosis not present

## 2023-04-04 DIAGNOSIS — E785 Hyperlipidemia, unspecified: Secondary | ICD-10-CM

## 2023-04-04 DIAGNOSIS — I10 Essential (primary) hypertension: Secondary | ICD-10-CM

## 2023-04-04 DIAGNOSIS — I25119 Atherosclerotic heart disease of native coronary artery with unspecified angina pectoris: Secondary | ICD-10-CM

## 2023-04-04 DIAGNOSIS — R400 Somnolence: Secondary | ICD-10-CM

## 2023-04-04 DIAGNOSIS — E118 Type 2 diabetes mellitus with unspecified complications: Secondary | ICD-10-CM

## 2023-04-04 DIAGNOSIS — E1169 Type 2 diabetes mellitus with other specified complication: Secondary | ICD-10-CM

## 2023-04-04 DIAGNOSIS — Z794 Long term (current) use of insulin: Secondary | ICD-10-CM

## 2023-04-04 MED ORDER — LOSARTAN POTASSIUM 100 MG PO TABS: 100.0000 mg | ORAL_TABLET | Freq: Every day | ORAL | 3 refills | Status: DC

## 2023-04-04 MED ORDER — AMLODIPINE BESYLATE 10 MG PO TABS: 10.0000 mg | ORAL_TABLET | Freq: Every day | ORAL | 3 refills | Status: AC

## 2023-04-04 NOTE — Patient Instructions (Signed)
Medication Instructions:  The current medical regimen is effective;  continue present plan and medications as directed. Please refer to the Current Medication list given to you today.  *If you need a refill on your cardiac medications before your next appointment, please call your pharmacy*  Lab Work: NONE  Other Instructions INCREASE PHYSICAL ACTIVITY-AS TOLERATED PLEASE READ AND FOLLOW ATTACHED  SALTY 6   REFER TO ISABEL-SOCIAL WORKER  Follow-Up: At [facility], you and your health needs are our priority.  As part of our continuing mission to provide you with exceptional heart care, we have created designated Provider Care Teams.  These Care Teams include your primary Cardiologist (physician) and Advanced Practice Providers (APPs -  Physician Assistants and Nurse Practitioners) who all work together to provide you with the care you need, when you need it.  Your next appointment:   9-12 month(s)  Provider:   Bryan Lemma, MD  or Edd Fabian, FNP

## 2023-05-02 ENCOUNTER — Telehealth: Payer: Self-pay | Admitting: Licensed Clinical Social Worker

## 2023-05-02 NOTE — Telephone Encounter (Signed)
H&V Care Navigation CSW Progress Note  Clinical Social Worker contacted patient by phone to f/u on routed referral regarding patient assistance. Was unaware of referral until patient access routed it to [place] due to incorrect order number. I reviewed chart, appears pt out of work but has [employer] he should utilize a copay card for his Repatha to assist with costs. I left voicemail at [phone] to try and connect pt with this resource and assess for any additional needs. Will re-attempt again as able.  Patient is participating in a Managed Medicaid Plan:  No, self pay only  SDOH Screenings   Social Connections: Unknown (09/06/2022)   Received from [facility], [facility]  Tobacco Use: High Risk (04/04/2023)    Octavio Graves, MSW, LCSW Clinical Social Worker II [facility] Heart/Vascular Care Navigation  [phone]- work cell phone (preferred) [phone]- desk phone

## 2023-05-03 ENCOUNTER — Telehealth: Payer: Self-pay | Admitting: Licensed Clinical Social Worker

## 2023-05-03 NOTE — Telephone Encounter (Signed)
H&V Care Navigation CSW Progress Note  Clinical Social Worker contacted patient by phone to f/u on routed referral regarding patient assistance. Pt out of work per referral, issues with costs of care including medications. Pt should be given Repatha copay card if that continues to be a challenge. I left a 2nd voicemail at [phone] to try and connect pt with this resource and assess for any additional needs. Will re-attempt again as able.   Patient is participating in a Managed Medicaid Plan:  No, self pay only  SDOH Screenings   Social Connections: Unknown (09/06/2022)   Received from [facility], [facility]  Tobacco Use: High Risk (04/04/2023)   Octavio Graves, MSW, LCSW Clinical Social Worker II [facility] Heart/Vascular Care Navigation  [phone]- work cell phone (preferred) [phone]- desk phone

## 2023-05-06 ENCOUNTER — Telehealth: Payer: Self-pay | Admitting: Licensed Clinical Social Worker

## 2023-05-06 NOTE — Telephone Encounter (Signed)
H&V Care Navigation CSW Progress Note  Clinical Social Worker contacted patient by phone to f/u on routed referral regarding patient assistance. Pt out of work per referral, issues with costs of care including medications. I attempted to leave a 3rd voicemail at [phone], unfortunately pt voicemail now full. I mailed him physical brochure to sign up for Repatha copay along with my card. Remain available should pt return call/f.u with sent card and information.   Patient is participating in a Managed Medicaid Plan:  No, self pay only  SDOH Screenings   Social Connections: Unknown (09/06/2022)   Received from [facility], [facility]  Tobacco Use: High Risk (04/04/2023)   Octavio Graves, MSW, LCSW Clinical Social Worker II [facility] Heart/Vascular Care Navigation  [phone]- work cell phone (preferred) [phone]- desk phone

## 2023-05-09 ENCOUNTER — Telehealth: Payer: Self-pay | Admitting: Licensed Clinical Social Worker

## 2023-05-09 NOTE — Telephone Encounter (Signed)
H&V Care Navigation CSW Progress Note  Clinical Social Worker contacted patient by phone to f/u on routed referral regarding patient assistance. Pt out of work per referral, issues with costs of care including medications. I attempted to leave a 3rd voicemail at [phone], unfortunately pt voicemail still remains full. I have mailed pt my card and request to call back. No additional DPR on file. Remain available as needed. Pt still noted to have commercial insurance and therefore should use copay card assistance for Repatha.     Patient is participating in a Managed Medicaid Plan:  No, commercial [employer]  SDOH Screenings   Social Connections: Unknown (09/06/2022)   Received from [facility], [facility]  Tobacco Use: High Risk (04/04/2023)    Octavio Graves, MSW, LCSW Clinical Social Worker II [facility] Heart/Vascular Care Navigation  [phone]- work cell phone (preferred) [phone]- desk phone

## 2023-05-20 ENCOUNTER — Other Ambulatory Visit: Payer: Self-pay | Admitting: Cardiology

## 2023-05-20 DIAGNOSIS — I2111 ST elevation (STEMI) myocardial infarction involving right coronary artery: Secondary | ICD-10-CM

## 2023-05-20 DIAGNOSIS — I25119 Atherosclerotic heart disease of native coronary artery with unspecified angina pectoris: Secondary | ICD-10-CM

## 2023-05-20 DIAGNOSIS — E785 Hyperlipidemia, unspecified: Secondary | ICD-10-CM

## 2023-07-20 ENCOUNTER — Other Ambulatory Visit (HOSPITAL_COMMUNITY): Payer: Self-pay

## 2023-07-20 ENCOUNTER — Telehealth: Payer: Self-pay | Admitting: Pharmacy Technician

## 2023-07-20 NOTE — Telephone Encounter (Addendum)
    I called [facility] and they said the patient just got this on 22nd but this is on file for next time

## 2023-10-16 ENCOUNTER — Other Ambulatory Visit: Payer: Self-pay | Admitting: Cardiology

## 2023-10-16 DIAGNOSIS — I25119 Atherosclerotic heart disease of native coronary artery with unspecified angina pectoris: Secondary | ICD-10-CM

## 2023-10-16 DIAGNOSIS — E785 Hyperlipidemia, unspecified: Secondary | ICD-10-CM

## 2023-10-16 DIAGNOSIS — I2111 ST elevation (STEMI) myocardial infarction involving right coronary artery: Secondary | ICD-10-CM

## 2023-12-30 ENCOUNTER — Telehealth: Payer: Self-pay | Admitting: Cardiology

## 2023-12-30 NOTE — Telephone Encounter (Signed)
 Left message for patient to call back.  Called pharmacy to let them know Dr. Anner is fine with Dr. Ilah ordering spirolactone, just as long as Dr. Ilah is checking his labs.

## 2023-12-30 NOTE — Telephone Encounter (Signed)
 Spironolactone is a reasonable addition to losartan .  The PCP just needs to monitor Potassium level.  It is a good option especially with people who have heart disease.

## 2023-12-30 NOTE — Telephone Encounter (Signed)
 Called pharmacy back about message. They stated that patient was started on spironolactone by Dr. Ilah, PCP. They are concerned that patient is also on losartan  100 mg by mouth daily. Called patient about medication. Patient stated his PCP added spironolactone 25 mg due to his elevated BP 154/90. Tried to call patient's PCP, had to leave a message to call back. Will send message to Dr. Anner for advisement, while awaiting PCP's call back.

## 2023-12-30 NOTE — Telephone Encounter (Signed)
 Pt c/o medication issue:  1. Name of Medication:   losartan  (COZAAR ) 100 MG tablet    2. How are you currently taking this medication (dosage and times per day)?   3. Are you having a reaction (difficulty breathing--STAT)? no  4. What is your medication issue? Pharmacy is calling because PCP sent over a medication that interact with the medication that we prescribe the patient. Calling to see what needs to be down. Please advise

## 2024-01-02 NOTE — Telephone Encounter (Signed)
 Talked to Dr. Scotty nurse. She stated Dr. Ilah will check patient's BMET on his next visit which is in November. Will send message to Dr. Anner, so he is aware.

## 2024-01-25 NOTE — Telephone Encounter (Signed)
**Note De-Identified George Dickson Obfuscation** Pt did not return our calls and was not provided with a Itamar-HST device by the office. I have canceled the Itamar-HST order.

## 2024-03-20 ENCOUNTER — Telehealth: Payer: Self-pay | Admitting: Pharmacy Technician

## 2024-03-20 NOTE — Telephone Encounter (Signed)
 Pharmacy Patient Advocate Encounter  Received notification from [employer] that Prior Authorization for repatha  has been APPROVED from 03/20/24 to 05/23/2038   PA #/Case ID/Reference #: [id number]

## 2024-03-20 NOTE — Telephone Encounter (Signed)
   Pharmacy Patient Advocate Encounter   Received notification from Cover[employer]Meds that prior authorization for repatha  is required/requested.   Insurance verification completed.   The patient is insured through [facility].   Per test claim: PA required; PA submitted to above mentioned insurance via Latent Key/confirmation #/EOC [id number] Status is pending

## 2024-04-07 ENCOUNTER — Other Ambulatory Visit: Payer: Self-pay | Admitting: General Practice

## 2024-05-12 ENCOUNTER — Other Ambulatory Visit: Payer: Self-pay | Admitting: General Practice
